# Patient Record
Sex: Male | Born: 1960 | Race: White | Hispanic: No | Marital: Single | Smoking: Never smoker
Health system: Southern US, Community
[De-identification: ages and names within clinical notes are randomized; demographics above are authoritative.]

## PROBLEM LIST (undated history)

## (undated) DIAGNOSIS — I10 Essential (primary) hypertension: Secondary | ICD-10-CM

## (undated) DIAGNOSIS — E1142 Type 2 diabetes mellitus with diabetic polyneuropathy: Secondary | ICD-10-CM

## (undated) DIAGNOSIS — I509 Heart failure, unspecified: Secondary | ICD-10-CM

## (undated) DIAGNOSIS — R1314 Dysphagia, pharyngoesophageal phase: Secondary | ICD-10-CM

## (undated) DIAGNOSIS — I429 Cardiomyopathy, unspecified: Secondary | ICD-10-CM

## (undated) DIAGNOSIS — I2510 Atherosclerotic heart disease of native coronary artery without angina pectoris: Secondary | ICD-10-CM

## (undated) DIAGNOSIS — K5792 Diverticulitis of intestine, part unspecified, without perforation or abscess without bleeding: Secondary | ICD-10-CM

## (undated) DIAGNOSIS — E11319 Type 2 diabetes mellitus with unspecified diabetic retinopathy without macular edema: Secondary | ICD-10-CM

## (undated) DIAGNOSIS — E7800 Pure hypercholesterolemia, unspecified: Secondary | ICD-10-CM

## (undated) DIAGNOSIS — Z8489 Family history of other specified conditions: Secondary | ICD-10-CM

## (undated) DIAGNOSIS — E119 Type 2 diabetes mellitus without complications: Secondary | ICD-10-CM

## (undated) DIAGNOSIS — R0600 Dyspnea, unspecified: Secondary | ICD-10-CM

## (undated) HISTORY — DX: Type 2 diabetes mellitus without complications: E11.9

## (undated) HISTORY — PX: WISDOM TOOTH EXTRACTION: SHX21

## (undated) HISTORY — DX: Diverticulitis of intestine, part unspecified, without perforation or abscess without bleeding: K57.92

## (undated) HISTORY — DX: Dysphagia, pharyngoesophageal phase: R13.14

## (undated) HISTORY — PX: CARDIAC CATHETERIZATION: SHX172

## (undated) HISTORY — DX: Essential (primary) hypertension: I10

## (undated) HISTORY — DX: Pure hypercholesterolemia, unspecified: E78.00

## (undated) HISTORY — PX: EYE SURGERY: SHX253

## (undated) HISTORY — DX: Type 2 diabetes mellitus with diabetic polyneuropathy: E11.42

---

## 2001-12-15 ENCOUNTER — Encounter: Admission: RE | Admit: 2001-12-15 | Discharge: 2002-03-15 | Payer: Self-pay | Admitting: Internal Medicine

## 2018-09-22 ENCOUNTER — Other Ambulatory Visit: Payer: Self-pay | Admitting: Family Medicine

## 2018-09-22 DIAGNOSIS — R1904 Left lower quadrant abdominal swelling, mass and lump: Secondary | ICD-10-CM

## 2018-09-22 DIAGNOSIS — R1032 Left lower quadrant pain: Secondary | ICD-10-CM

## 2018-10-03 ENCOUNTER — Ambulatory Visit
Admission: RE | Admit: 2018-10-03 | Discharge: 2018-10-03 | Disposition: A | Discharge status: Home / Self Care | Payer: 59 | Source: Ambulatory Visit | Attending: Family Medicine | Admitting: Family Medicine

## 2018-10-03 DIAGNOSIS — R1904 Left lower quadrant abdominal swelling, mass and lump: Secondary | ICD-10-CM

## 2018-10-03 DIAGNOSIS — R1032 Left lower quadrant pain: Secondary | ICD-10-CM

## 2018-10-03 MED ORDER — IOPAMIDOL (ISOVUE-300) INJECTION 61%
100.0000 mL | Freq: Once | INTRAVENOUS | Status: AC | PRN
  Administered 2018-10-03: 100 mL via INTRAVENOUS

## 2019-08-24 ENCOUNTER — Encounter: Payer: Self-pay | Admitting: Neurology

## 2019-08-28 ENCOUNTER — Other Ambulatory Visit: Payer: Self-pay | Admitting: Gastroenterology

## 2019-08-28 DIAGNOSIS — R599 Enlarged lymph nodes, unspecified: Secondary | ICD-10-CM

## 2019-08-28 DIAGNOSIS — R634 Abnormal weight loss: Secondary | ICD-10-CM

## 2019-08-28 DIAGNOSIS — R1032 Left lower quadrant pain: Secondary | ICD-10-CM

## 2019-09-07 ENCOUNTER — Ambulatory Visit
Admission: RE | Admit: 2019-09-07 | Discharge: 2019-09-07 | Disposition: A | Discharge status: Home / Self Care | Payer: 59 | Source: Ambulatory Visit | Attending: Gastroenterology | Admitting: Gastroenterology

## 2019-09-07 DIAGNOSIS — R634 Abnormal weight loss: Secondary | ICD-10-CM

## 2019-09-07 DIAGNOSIS — R1032 Left lower quadrant pain: Secondary | ICD-10-CM

## 2019-09-07 DIAGNOSIS — R599 Enlarged lymph nodes, unspecified: Secondary | ICD-10-CM

## 2019-09-07 MED ORDER — IOPAMIDOL (ISOVUE-300) INJECTION 61%
100.0000 mL | Freq: Once | INTRAVENOUS | Status: AC | PRN
  Administered 2019-09-07: 100 mL via INTRAVENOUS

## 2019-09-28 ENCOUNTER — Other Ambulatory Visit: Payer: Self-pay

## 2019-09-28 ENCOUNTER — Other Ambulatory Visit (INDEPENDENT_AMBULATORY_CARE_PROVIDER_SITE_OTHER): Payer: Managed Care, Other (non HMO)

## 2019-09-28 ENCOUNTER — Ambulatory Visit (INDEPENDENT_AMBULATORY_CARE_PROVIDER_SITE_OTHER): Payer: Managed Care, Other (non HMO) | Admitting: Neurology

## 2019-09-28 ENCOUNTER — Encounter: Payer: Self-pay | Admitting: Neurology

## 2019-09-28 VITALS — BP 131/82 | HR 78 | Ht 69.0 in | Wt 159.0 lb

## 2019-09-28 DIAGNOSIS — G621 Alcoholic polyneuropathy: Secondary | ICD-10-CM

## 2019-09-28 DIAGNOSIS — E639 Nutritional deficiency, unspecified: Secondary | ICD-10-CM | POA: Diagnosis not present

## 2019-09-28 DIAGNOSIS — E1142 Type 2 diabetes mellitus with diabetic polyneuropathy: Secondary | ICD-10-CM | POA: Diagnosis not present

## 2019-09-28 MED ORDER — GABAPENTIN 100 MG PO CAPS: ORAL_CAPSULE | ORAL | 3 refills | Status: DC

## 2019-09-28 NOTE — Progress Notes (Signed)
[facility] Clinic Note - Initial Visit   Date: 09/28/19  Brett Walker MRN: [id number] DOB: 1960/12/14   Dear Dr. Azucena CecilSwayne:  Thank you for your kind referral of Brett Walker for consultation of diabetic neuropathy. Although his history is well known to you, please allow us to reiterate it for the purpose of our medical record. The patient was accompanied to the clinic by self.    History of Present Illness: Brett Walker is a 58 y.o. right-handed male with diabetes mellitus, hyperlipidemia, hypertension, and alcohol abuse presenting for evaluation of neuropathy.   Starting around spring of 2020, he began having cold sensation of the toes, tingling, and numbness which started in the feet and has extended in to the lower legs up to the knees.  He denies weakness.  He has not had any fall, but reports imbalance.  He endorses mild weakness of the hands.  There is no numbness or tingling of the hands.  He also complains of achy pain involving the soles of the feet upon standing.  He takes gabapentin 300 mg at bedtime which helps with his nighttime pain, but he still has intermittent sporadic shooting pain in the legs throughout the day, worse in the evening.  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  He lives alone, no children.  He was previously drinking ~24 beers over the weekend for many years.    For the past year, he has been left lower quadrant abdominal pain and lost about 30 pounds unintentionally.  He is followed by gastroenterology and had a endoscopy which showed GI ulcers.  CT scan from November 2020 shows diffuse enlargement of the prostate and seminal vesicles, prostate neoplasm cannot be excluded.  However, there was an irregular opacity in the stomach body, possibly representing gastric mass.  Further testing was recommended with upper GI series. Normal CBC CMP and TSH.   Past Medical History:  Diagnosis Date  . Diabetes mellitus without  complication (HCC)   . Diabetic peripheral neuropathy (HCC)   . Diverticulitis   . Elevated LDL cholesterol level   . Hypertension   . Pharyngoesophageal dysphagia     Past Surgical History:  Procedure Laterality Date  . WISDOM TOOTH EXTRACTION       Medications:  Outpatient Encounter Medications as of 09/28/2019  Medication Sig  . bisacodyl (DULCOLAX) 5 MG EC tablet Take 5 mg by mouth daily as needed for moderate constipation.  . gabapentin (NEURONTIN) 300 MG capsule Take 300 mg by mouth once.  . metFORMIN (GLUCOPHAGE) 1000 MG tablet Take 1,000 mg by mouth daily.  . polyethylene glycol-electrolytes (NULYTELY/GOLYTELY) 420 g solution Take 4,000 mLs by mouth once.   No facility-administered encounter medications on file as of 09/28/2019.     Allergies:  Allergies  Allergen Reactions  . Penicillin G Hives  . Lipitor [Atorvastatin Calcium]     Family History: Family History  Problem Relation Age of Onset  . Parkinson's disease Father     Social History: Social History   Tobacco Use  . Smoking status: Never Smoker  . Smokeless tobacco: Never Used  Substance Use Topics  . Alcohol use: Never    Frequency: Never  . Drug use: Never   Social History   Social History Narrative   Works at [employer]   Single   Right handed   First floor apartment    Review of Systems:  CONSTITUTIONAL: No fevers, chills, night sweats, + 15 lb weight loss.  EYES: No visual changes or eye pain ENT: No hearing changes.  No history of nose bleeds.   RESPIRATORY: No cough, wheezing and shortness of breath.   CARDIOVASCULAR: Negative for chest pain, and palpitations.   GI: +for abdominal discomfort, blood in stools or black stools.  No recent change in bowel habits.   GU:  No history of incontinence.   MUSCLOSKELETAL: No history of joint pain or swelling.  No myalgias.   SKIN: Negative for lesions, rash, and itching.   HEMATOLOGY/ONCOLOGY: Negative for prolonged bleeding,  bruising easily, and swollen nodes.  No history of cancer.   ENDOCRINE: Negative for cold or heat intolerance, polydipsia or goiter.   PSYCH:  No depression or anxiety symptoms.   NEURO: As Above.   Vital Signs:  BP 131/82   Pulse 78   Ht 5\' 9"  (1.753 m)   Wt 159 lb (72.1 kg)   SpO2 100%   BMI 23.48 kg/m    General Medical Exam:   General:  Thin appearing, comfortable.   Eyes/ENT: see cranial nerve examination.   Neck:   No carotid bruits. Respiratory:  Clear to auscultation, good air entry bilaterally.   Cardiac:  Regular rate and rhythm, no murmur.   Extremities:  No deformities, edema, or skin discoloration.  Skin:  No rashes or lesions.  Neurological Exam: MENTAL STATUS including orientation to time, place, person, recent and remote memory, attention span and concentration, language, and fund of knowledge is normal.  Speech is not dysarthric.  CRANIAL NERVES: II:  No visual field defects.   III-IV-VI: Pupils equal round and reactive to light.  Normal conjugate, extra-ocular eye movements in all directions of gaze.  No nystagmus.  No ptosis.   V:  Normal facial sensation.    VII:  Normal facial symmetry and movements.   VIII:  Normal hearing and vestibular function.   IX-X:  Normal palatal movement.   XI:  Normal shoulder shrug and head rotation.   XII:  Normal tongue strength and range of motion, no deviation or fasciculation.  MOTOR: Generalized loss of muscle bulk in the hands and legs.  There is mild intrinsic and muscle atrophy.  No fasciculations or abnormal movements.  No pronator drift.   Upper Extremity:  Right  Left  Deltoid  5/5   5/5   Biceps  5/5   5/5   Triceps  5/5   5/5   Infraspinatus 5/5  5/5  Medial pectoralis 5/5  5/5  Wrist extensors  5/5   5/5   Wrist flexors  5/5   5/5   Finger extensors  5/5   5/5   Finger flexors  5/5   5/5   Dorsal interossei  4/5   4/5   Abductor pollicis  5/5   5/5   Tone (Ashworth scale)  0  0   Lower Extremity:   Right  Left  Hip flexors  5/5   5/5   Hip extensors  5/5   5/5   Adductor 5/5  5/5  Abductor 5/5  5/5  Knee flexors  5/5   5/5   Knee extensors  5/5   5/5   Dorsiflexors  5/5   5/5   Plantarflexors  5/5   5/5   Toe extensors  5/5   5/5   Toe flexors  5/5   5/5   Tone (Ashworth scale)  0  0   MSRs:  Right        Left  brachioradialis 2+  2+  biceps 2+  2+  triceps 2+  2+  patellar 0  0  ankle jerk 0  0  Hoffman no  no  plantar response down  down   SENSORY: Sensation to all modalities is intact and not hands.  Vibration is reduced to 50% at the ankles and ~20% at the great toe.  There is a gradient pattern of temperature and pinprick loss below the knee and worse distally. Romberg's sign present.   COORDINATION/GAIT: Normal finger-to- nose-finger.  Intact rapid alternating movements bilaterally.  Able to rise from a chair without using arms.  Gait narrow based and stable.  Stressed gait is intact.  He is very unsteady with tandem gait.     IMPRESSION: Subacute polyneuropathy manifesting with sensory deficits in the legs and motor weakness in the hands.  Symptoms started in the spring 2020 and have been progressive.  I am concerned that his neuropathy has been getting worse and relatively rapid manner, which is atypical for diabetes.  Nutritional deficiency and alcohol-induced neuropathies can be a in more of an acute manner as such.  Alternatively, with his weight loss, paraneoplastic neuropathy also needs to be considered.    PLAN/RECOMMENDATIONS:  Check vitamin B12, folate, vitamin B1, copper, SPEP with IFE NCS/EMG left arm and leg Start gabapentin 100mg  at 6pm, continue 300mg  at bedtime.  Further titrate as tolerable Patient education on importance of abstaining from alcohol abstinence  Patient educated on daily foot inspection, fall prevention, and safety precautions around the home.  Further recommendations pending results.  Thank you for allowing me to  participate in patient's care.  If I can answer any additional questions, I would be pleased to do so.    Sincerely,    Kostas Marrow K. Posey Pronto, DO

## 2019-09-28 NOTE — Patient Instructions (Addendum)
Nerve testing of the left arm and leg  Check labs  Your provider has requested that you have labwork completed today. Please go to [facility] ([id number]) on the second floor of this building before leaving the office today. You do not need to check in. If you are not called within 15 minutes please check with the front desk. Start gabapentin 100mg  at 6pm.  Continue gabapentin 300mg  at bedtime  Check your feet daily  Take extra caution on uneven ground  ELECTROMYOGRAM AND NERVE CONDUCTION STUDIES (EMG/NCS) INSTRUCTIONS  How to Prepare The neurologist conducting the EMG will need to know if you have certain medical conditions. Tell the neurologist and other EMG lab personnel if you: . Have a pacemaker or any other electrical medical device . Take blood-thinning medications . Have hemophilia, a blood-clotting disorder that causes prolonged bleeding Bathing Take a shower or bath shortly before your exam in order to remove oils from your skin. Don't apply lotions or creams before the exam.  What to Expect You'll likely be asked to change into a hospital gown for the procedure and lie down on an examination table. The following explanations can help you understand what will happen during the exam.  . Electrodes. The neurologist or a technician places surface electrodes at various locations on your skin depending on where you're experiencing symptoms. Or the neurologist may insert needle electrodes at different sites depending on your symptoms.  . Sensations. The electrodes will at times transmit a tiny electrical current that you may feel as a twinge or spasm. The needle electrode may cause discomfort or pain that usually ends shortly after the needle is removed. If you are concerned about discomfort or pain, you may want to talk to the neurologist about taking a short break during the exam.  . Instructions. During the needle EMG, the neurologist will assess whether there is any  spontaneous electrical activity when the muscle is at rest - activity that isn't present in healthy muscle tissue - and the degree of activity when you slightly contract the muscle.  He or she will give you instructions on resting and contracting a muscle at appropriate times. Depending on what muscles and nerves the neurologist is examining, he or she may ask you to change positions during the exam.  After your EMG You may experience some temporary, minor bruising where the needle electrode was inserted into your muscle. This bruising should fade within several days. If it persists, contact your primary care doctor.

## 2019-10-02 LAB — VITAMIN B1: Vitamin B1 (Thiamine): 9 nmol/L (ref 8–30)

## 2019-10-02 LAB — PROTEIN ELECTROPHORESIS, SERUM
Albumin ELP: 3.8 g/dL (ref 3.8–4.8)
Alpha 1: 0.3 g/dL (ref 0.2–0.3)
Alpha 2: 0.6 g/dL (ref 0.5–0.9)
Beta 2: 0.3 g/dL (ref 0.2–0.5)
Beta Globulin: 0.3 g/dL — ABNORMAL LOW (ref 0.4–0.6)
Gamma Globulin: 0.7 g/dL — ABNORMAL LOW (ref 0.8–1.7)
Total Protein: 6 g/dL — ABNORMAL LOW (ref 6.1–8.1)

## 2019-10-02 LAB — IMMUNOFIXATION ELECTROPHORESIS
IgG (Immunoglobin G), Serum: 726 mg/dL (ref 600–1640)
IgM, Serum: 26 mg/dL — ABNORMAL LOW (ref 50–300)
Immunofix Electr Int: NOT DETECTED
Immunoglobulin A: 248 mg/dL (ref 47–310)

## 2019-10-02 LAB — B12 AND FOLATE PANEL
Folate: 6.6 ng/mL
Vitamin B-12: 276 pg/mL (ref 200–1100)

## 2019-10-02 LAB — COPPER, SERUM: Copper: 88 ug/dL (ref 70–175)

## 2021-04-11 DIAGNOSIS — H25813 Combined forms of age-related cataract, bilateral: Secondary | ICD-10-CM | POA: Diagnosis not present

## 2021-04-11 DIAGNOSIS — H53132 Sudden visual loss, left eye: Secondary | ICD-10-CM | POA: Diagnosis not present

## 2021-04-11 DIAGNOSIS — H4312 Vitreous hemorrhage, left eye: Secondary | ICD-10-CM | POA: Diagnosis not present

## 2021-04-11 DIAGNOSIS — E113511 Type 2 diabetes mellitus with proliferative diabetic retinopathy with macular edema, right eye: Secondary | ICD-10-CM | POA: Diagnosis not present

## 2021-04-14 DIAGNOSIS — E113212 Type 2 diabetes mellitus with mild nonproliferative diabetic retinopathy with macular edema, left eye: Secondary | ICD-10-CM | POA: Diagnosis not present

## 2021-05-05 DIAGNOSIS — E113511 Type 2 diabetes mellitus with proliferative diabetic retinopathy with macular edema, right eye: Secondary | ICD-10-CM | POA: Diagnosis not present

## 2021-05-18 DIAGNOSIS — E113212 Type 2 diabetes mellitus with mild nonproliferative diabetic retinopathy with macular edema, left eye: Secondary | ICD-10-CM | POA: Diagnosis not present

## 2021-05-22 DIAGNOSIS — E1169 Type 2 diabetes mellitus with other specified complication: Secondary | ICD-10-CM | POA: Diagnosis not present

## 2021-05-22 DIAGNOSIS — E1142 Type 2 diabetes mellitus with diabetic polyneuropathy: Secondary | ICD-10-CM | POA: Diagnosis not present

## 2021-05-22 DIAGNOSIS — I10 Essential (primary) hypertension: Secondary | ICD-10-CM | POA: Diagnosis not present

## 2021-05-22 DIAGNOSIS — E7800 Pure hypercholesterolemia, unspecified: Secondary | ICD-10-CM | POA: Diagnosis not present

## 2021-05-24 DIAGNOSIS — E119 Type 2 diabetes mellitus without complications: Secondary | ICD-10-CM | POA: Diagnosis not present

## 2021-05-24 DIAGNOSIS — F5104 Psychophysiologic insomnia: Secondary | ICD-10-CM | POA: Diagnosis not present

## 2021-05-24 DIAGNOSIS — E559 Vitamin D deficiency, unspecified: Secondary | ICD-10-CM | POA: Diagnosis not present

## 2021-06-06 DIAGNOSIS — E113511 Type 2 diabetes mellitus with proliferative diabetic retinopathy with macular edema, right eye: Secondary | ICD-10-CM | POA: Diagnosis not present

## 2021-06-22 DIAGNOSIS — E113212 Type 2 diabetes mellitus with mild nonproliferative diabetic retinopathy with macular edema, left eye: Secondary | ICD-10-CM | POA: Diagnosis not present

## 2021-07-05 DIAGNOSIS — R809 Proteinuria, unspecified: Secondary | ICD-10-CM | POA: Diagnosis not present

## 2021-07-05 DIAGNOSIS — E119 Type 2 diabetes mellitus without complications: Secondary | ICD-10-CM | POA: Diagnosis not present

## 2021-07-05 DIAGNOSIS — E559 Vitamin D deficiency, unspecified: Secondary | ICD-10-CM | POA: Diagnosis not present

## 2021-07-05 DIAGNOSIS — E1140 Type 2 diabetes mellitus with diabetic neuropathy, unspecified: Secondary | ICD-10-CM | POA: Diagnosis not present

## 2021-07-12 DIAGNOSIS — E113511 Type 2 diabetes mellitus with proliferative diabetic retinopathy with macular edema, right eye: Secondary | ICD-10-CM | POA: Diagnosis not present

## 2021-07-31 DIAGNOSIS — H35371 Puckering of macula, right eye: Secondary | ICD-10-CM | POA: Diagnosis not present

## 2021-07-31 DIAGNOSIS — E113511 Type 2 diabetes mellitus with proliferative diabetic retinopathy with macular edema, right eye: Secondary | ICD-10-CM | POA: Diagnosis not present

## 2021-07-31 DIAGNOSIS — H3582 Retinal ischemia: Secondary | ICD-10-CM | POA: Diagnosis not present

## 2021-07-31 DIAGNOSIS — E113312 Type 2 diabetes mellitus with moderate nonproliferative diabetic retinopathy with macular edema, left eye: Secondary | ICD-10-CM | POA: Diagnosis not present

## 2021-08-07 DIAGNOSIS — E113511 Type 2 diabetes mellitus with proliferative diabetic retinopathy with macular edema, right eye: Secondary | ICD-10-CM | POA: Diagnosis not present

## 2021-08-17 DIAGNOSIS — E113511 Type 2 diabetes mellitus with proliferative diabetic retinopathy with macular edema, right eye: Secondary | ICD-10-CM | POA: Diagnosis not present

## 2021-08-23 DIAGNOSIS — E113512 Type 2 diabetes mellitus with proliferative diabetic retinopathy with macular edema, left eye: Secondary | ICD-10-CM | POA: Diagnosis not present

## 2021-08-25 DIAGNOSIS — H53132 Sudden visual loss, left eye: Secondary | ICD-10-CM | POA: Diagnosis not present

## 2021-08-25 DIAGNOSIS — E113511 Type 2 diabetes mellitus with proliferative diabetic retinopathy with macular edema, right eye: Secondary | ICD-10-CM | POA: Diagnosis not present

## 2021-08-25 DIAGNOSIS — H35371 Puckering of macula, right eye: Secondary | ICD-10-CM | POA: Diagnosis not present

## 2021-08-25 DIAGNOSIS — H4312 Vitreous hemorrhage, left eye: Secondary | ICD-10-CM | POA: Diagnosis not present

## 2021-08-25 DIAGNOSIS — E113212 Type 2 diabetes mellitus with mild nonproliferative diabetic retinopathy with macular edema, left eye: Secondary | ICD-10-CM | POA: Diagnosis not present

## 2021-09-22 DIAGNOSIS — E113511 Type 2 diabetes mellitus with proliferative diabetic retinopathy with macular edema, right eye: Secondary | ICD-10-CM | POA: Diagnosis not present

## 2021-09-26 ENCOUNTER — Emergency Department (HOSPITAL_BASED_OUTPATIENT_CLINIC_OR_DEPARTMENT_OTHER): Payer: BC Managed Care – PPO

## 2021-09-26 ENCOUNTER — Inpatient Hospital Stay (HOSPITAL_BASED_OUTPATIENT_CLINIC_OR_DEPARTMENT_OTHER)
Admission: EM | Admit: 2021-09-26 | Discharge: 2021-09-30 | DRG: 286 | Disposition: A | Discharge status: Home / Self Care | Payer: BC Managed Care – PPO | Attending: Family Medicine | Admitting: Family Medicine

## 2021-09-26 ENCOUNTER — Encounter (HOSPITAL_BASED_OUTPATIENT_CLINIC_OR_DEPARTMENT_OTHER): Payer: Self-pay

## 2021-09-26 ENCOUNTER — Other Ambulatory Visit: Payer: Self-pay

## 2021-09-26 DIAGNOSIS — Z8249 Family history of ischemic heart disease and other diseases of the circulatory system: Secondary | ICD-10-CM | POA: Diagnosis not present

## 2021-09-26 DIAGNOSIS — Z20822 Contact with and (suspected) exposure to covid-19: Secondary | ICD-10-CM | POA: Diagnosis not present

## 2021-09-26 DIAGNOSIS — E1142 Type 2 diabetes mellitus with diabetic polyneuropathy: Secondary | ICD-10-CM | POA: Diagnosis not present

## 2021-09-26 DIAGNOSIS — Z7984 Long term (current) use of oral hypoglycemic drugs: Secondary | ICD-10-CM

## 2021-09-26 DIAGNOSIS — E11319 Type 2 diabetes mellitus with unspecified diabetic retinopathy without macular edema: Secondary | ICD-10-CM | POA: Diagnosis present

## 2021-09-26 DIAGNOSIS — R7989 Other specified abnormal findings of blood chemistry: Secondary | ICD-10-CM

## 2021-09-26 DIAGNOSIS — E1169 Type 2 diabetes mellitus with other specified complication: Secondary | ICD-10-CM | POA: Diagnosis not present

## 2021-09-26 DIAGNOSIS — I517 Cardiomegaly: Secondary | ICD-10-CM | POA: Diagnosis not present

## 2021-09-26 DIAGNOSIS — I5043 Acute on chronic combined systolic (congestive) and diastolic (congestive) heart failure: Secondary | ICD-10-CM | POA: Diagnosis not present

## 2021-09-26 DIAGNOSIS — I5041 Acute combined systolic (congestive) and diastolic (congestive) heart failure: Secondary | ICD-10-CM | POA: Diagnosis not present

## 2021-09-26 DIAGNOSIS — Z6832 Body mass index (BMI) 32.0-32.9, adult: Secondary | ICD-10-CM

## 2021-09-26 DIAGNOSIS — G4733 Obstructive sleep apnea (adult) (pediatric): Secondary | ICD-10-CM | POA: Diagnosis present

## 2021-09-26 DIAGNOSIS — R0602 Shortness of breath: Secondary | ICD-10-CM | POA: Diagnosis not present

## 2021-09-26 DIAGNOSIS — E669 Obesity, unspecified: Secondary | ICD-10-CM | POA: Diagnosis not present

## 2021-09-26 DIAGNOSIS — I10 Essential (primary) hypertension: Secondary | ICD-10-CM | POA: Diagnosis not present

## 2021-09-26 DIAGNOSIS — N179 Acute kidney failure, unspecified: Secondary | ICD-10-CM | POA: Diagnosis not present

## 2021-09-26 DIAGNOSIS — R778 Other specified abnormalities of plasma proteins: Secondary | ICD-10-CM | POA: Diagnosis not present

## 2021-09-26 DIAGNOSIS — I2510 Atherosclerotic heart disease of native coronary artery without angina pectoris: Secondary | ICD-10-CM | POA: Diagnosis present

## 2021-09-26 DIAGNOSIS — I509 Heart failure, unspecified: Secondary | ICD-10-CM | POA: Diagnosis not present

## 2021-09-26 DIAGNOSIS — I255 Ischemic cardiomyopathy: Secondary | ICD-10-CM | POA: Diagnosis present

## 2021-09-26 DIAGNOSIS — Z888 Allergy status to other drugs, medicaments and biological substances status: Secondary | ICD-10-CM | POA: Diagnosis not present

## 2021-09-26 DIAGNOSIS — I248 Other forms of acute ischemic heart disease: Secondary | ICD-10-CM | POA: Diagnosis present

## 2021-09-26 DIAGNOSIS — E785 Hyperlipidemia, unspecified: Secondary | ICD-10-CM | POA: Diagnosis present

## 2021-09-26 DIAGNOSIS — E119 Type 2 diabetes mellitus without complications: Secondary | ICD-10-CM | POA: Diagnosis present

## 2021-09-26 DIAGNOSIS — Z79899 Other long term (current) drug therapy: Secondary | ICD-10-CM

## 2021-09-26 DIAGNOSIS — I5021 Acute systolic (congestive) heart failure: Secondary | ICD-10-CM | POA: Diagnosis not present

## 2021-09-26 DIAGNOSIS — Z66 Do not resuscitate: Secondary | ICD-10-CM | POA: Diagnosis present

## 2021-09-26 DIAGNOSIS — R079 Chest pain, unspecified: Secondary | ICD-10-CM | POA: Diagnosis not present

## 2021-09-26 DIAGNOSIS — J90 Pleural effusion, not elsewhere classified: Secondary | ICD-10-CM | POA: Diagnosis not present

## 2021-09-26 DIAGNOSIS — I110 Hypertensive heart disease with heart failure: Secondary | ICD-10-CM | POA: Diagnosis not present

## 2021-09-26 DIAGNOSIS — N400 Enlarged prostate without lower urinary tract symptoms: Secondary | ICD-10-CM | POA: Diagnosis not present

## 2021-09-26 DIAGNOSIS — J9811 Atelectasis: Secondary | ICD-10-CM | POA: Diagnosis not present

## 2021-09-26 DIAGNOSIS — E6609 Other obesity due to excess calories: Secondary | ICD-10-CM | POA: Diagnosis not present

## 2021-09-26 DIAGNOSIS — M7989 Other specified soft tissue disorders: Secondary | ICD-10-CM | POA: Diagnosis not present

## 2021-09-26 HISTORY — DX: Type 2 diabetes mellitus with unspecified diabetic retinopathy without macular edema: E11.319

## 2021-09-26 LAB — CBC WITH DIFFERENTIAL/PLATELET
Abs Immature Granulocytes: 0.02 10*3/uL (ref 0.00–0.07)
Basophils Absolute: 0.1 10*3/uL (ref 0.0–0.1)
Basophils Relative: 1 %
Eosinophils Absolute: 0.2 10*3/uL (ref 0.0–0.5)
Eosinophils Relative: 4 %
HCT: 36.7 % — ABNORMAL LOW (ref 39.0–52.0)
Hemoglobin: 12.6 g/dL — ABNORMAL LOW (ref 13.0–17.0)
Immature Granulocytes: 0 %
Lymphocytes Relative: 19 %
Lymphs Abs: 1.1 10*3/uL (ref 0.7–4.0)
MCH: 30.7 pg (ref 26.0–34.0)
MCHC: 34.3 g/dL (ref 30.0–36.0)
MCV: 89.3 fL (ref 80.0–100.0)
Monocytes Absolute: 0.6 10*3/uL (ref 0.1–1.0)
Monocytes Relative: 10 %
Neutro Abs: 3.8 10*3/uL (ref 1.7–7.7)
Neutrophils Relative %: 66 %
Platelets: 214 10*3/uL (ref 150–400)
RBC: 4.11 MIL/uL — ABNORMAL LOW (ref 4.22–5.81)
RDW: 13.7 % (ref 11.5–15.5)
WBC: 5.8 10*3/uL (ref 4.0–10.5)
nRBC: 0 % (ref 0.0–0.2)

## 2021-09-26 LAB — COMPREHENSIVE METABOLIC PANEL
ALT: 21 U/L (ref 0–44)
AST: 16 U/L (ref 15–41)
Albumin: 3.3 g/dL — ABNORMAL LOW (ref 3.5–5.0)
Alkaline Phosphatase: 56 U/L (ref 38–126)
Anion gap: 6 (ref 5–15)
BUN: 23 mg/dL — ABNORMAL HIGH (ref 6–20)
CO2: 21 mmol/L — ABNORMAL LOW (ref 22–32)
Calcium: 8.5 mg/dL — ABNORMAL LOW (ref 8.9–10.3)
Chloride: 111 mmol/L (ref 98–111)
Creatinine, Ser: 1.15 mg/dL (ref 0.61–1.24)
GFR, Estimated: >60 mL/min (ref >60)
Glucose, Bld: 148 mg/dL — ABNORMAL HIGH (ref 70–99)
Potassium: 4.7 mmol/L (ref 3.5–5.1)
Sodium: 138 mmol/L (ref 135–145)
Total Bilirubin: 0.5 mg/dL (ref 0.3–1.2)
Total Protein: 6 g/dL — ABNORMAL LOW (ref 6.5–8.1)

## 2021-09-26 LAB — BRAIN NATRIURETIC PEPTIDE: B Natriuretic Peptide: 1194.7 pg/mL — ABNORMAL HIGH (ref 0.0–100.0)

## 2021-09-26 LAB — TROPONIN I (HIGH SENSITIVITY): Troponin I (High Sensitivity): 55 ng/L — ABNORMAL HIGH (ref <18)

## 2021-09-26 MED ORDER — IOHEXOL 350 MG/ML SOLN
75.0000 mL | Freq: Once | INTRAVENOUS | Status: AC | PRN
  Administered 2021-09-26: 75 mL via INTRAVENOUS

## 2021-09-26 MED ORDER — FUROSEMIDE 10 MG/ML IJ SOLN
40.0000 mg | Freq: Once | INTRAMUSCULAR | Status: AC
  Administered 2021-09-26: 40 mg via INTRAVENOUS
  Filled 2021-09-26: qty 4

## 2021-09-26 NOTE — ED Provider Notes (Signed)
Nursing notes and vitals signs, including pulse oximetry, reviewed.  Summary of this visit's results, reviewed by myself:  EKG:  EKG Interpretation  Date/Time:  Tuesday September 26 2021 20:34:32 EST Ventricular Rate:  103 PR Interval:  148 QRS Duration: 99 QT Interval:  346 QTC Calculation: 453 R Axis:   69 Text Interpretation: Sinus tachycardia Consider anterior infarct Nonspecific repol abnormality, lateral leads Baseline wander in lead(s) V5 Sinus tachycardia Confirmed by Coralee Pesa [phone]) on 09/26/2021 10:07:38 PM        Labs:  Results for orders placed or performed during the hospital encounter of 09/26/21 (from the past 24 hour(s))  CBC with Differential     Status: Abnormal   Collection Time: 09/26/21 10:16 PM  Result Value Ref Range   WBC 5.8 4.0 - 10.5 K/uL   RBC 4.11 (L) 4.22 - 5.81 MIL/uL   Hemoglobin 12.6 (L) 13.0 - 17.0 g/dL   HCT 10.2 (L) 72.5 - 36.6 %   MCV 89.3 80.0 - 100.0 fL   MCH 30.7 26.0 - 34.0 pg   MCHC 34.3 30.0 - 36.0 g/dL   RDW 44.0 34.7 - 42.5 %   Platelets 214 150 - 400 K/uL   nRBC 0.0 0.0 - 0.2 %   Neutrophils Relative % 66 %   Neutro Abs 3.8 1.7 - 7.7 K/uL   Lymphocytes Relative 19 %   Lymphs Abs 1.1 0.7 - 4.0 K/uL   Monocytes Relative 10 %   Monocytes Absolute 0.6 0.1 - 1.0 K/uL   Eosinophils Relative 4 %   Eosinophils Absolute 0.2 0.0 - 0.5 K/uL   Basophils Relative 1 %   Basophils Absolute 0.1 0.0 - 0.1 K/uL   Immature Granulocytes 0 %   Abs Immature Granulocytes 0.02 0.00 - 0.07 K/uL  Comprehensive metabolic panel     Status: Abnormal   Collection Time: 09/26/21 10:16 PM  Result Value Ref Range   Sodium 138 135 - 145 mmol/L   Potassium 4.7 3.5 - 5.1 mmol/L   Chloride 111 98 - 111 mmol/L   CO2 21 (L) 22 - 32 mmol/L   Glucose, Bld 148 (H) 70 - 99 mg/dL   BUN 23 (H) 6 - 20 mg/dL   Creatinine, Ser 9.56 0.61 - 1.24 mg/dL   Calcium 8.5 (L) 8.9 - 10.3 mg/dL   Total Protein 6.0 (L) 6.5 - 8.1 g/dL   Albumin 3.3 (L) 3.5 - 5.0 g/dL    AST 16 15 - 41 U/L   ALT 21 0 - 44 U/L   Alkaline Phosphatase 56 38 - 126 U/L   Total Bilirubin 0.5 0.3 - 1.2 mg/dL   GFR, Estimated >38 >75 mL/min   Anion gap 6 5 - 15  Troponin I (High Sensitivity)     Status: Abnormal   Collection Time: 09/26/21 10:16 PM  Result Value Ref Range   Troponin I (High Sensitivity) 55 (H) <18 ng/L  Brain natriuretic peptide     Status: Abnormal   Collection Time: 09/26/21 10:16 PM  Result Value Ref Range   B Natriuretic Peptide 1,194.7 (H) 0.0 - 100.0 pg/mL    Imaging Studies: DG Chest 2 View  Result Date: 09/26/2021 CLINICAL DATA:  Shortness of breath. EXAM: CHEST - 2 VIEW COMPARISON:  None. FINDINGS: Small bilateral pleural effusions and bibasilar atelectasis or infiltrate. There is mild cardiomegaly with mild vascular congestion. No pneumothorax. No acute osseous pathology. Degenerative changes of spine. IMPRESSION: 1. Small bilateral pleural effusions and bibasilar atelectasis or infiltrate. 2.  Mild cardiomegaly with mild vascular congestion. Electronically Signed   By: Elgie Collard M.D.   On: 09/26/2021 22:24   CT Angio Chest PE W/Cm &/Or Wo Cm  Result Date: 09/26/2021 CLINICAL DATA:  Lower extremity pain and swelling for 2 weeks with shortness of breath, initial encounter EXAM: CT ANGIOGRAPHY CHEST WITH CONTRAST TECHNIQUE: Multidetector CT imaging of the chest was performed using the standard protocol during bolus administration of intravenous contrast. Multiplanar CT image reconstructions and MIPs were obtained to evaluate the vascular anatomy. CONTRAST:  44mL OMNIPAQUE IOHEXOL 350 MG/ML SOLN COMPARISON:  Chest x-ray from earlier in the same day. FINDINGS: Cardiovascular: Thoracic aorta shows a normal enhancement pattern. No aneurysmal dilatation or dissection is noted. Coronary calcifications are seen. No cardiac enlargement is noted. Pulmonary artery shows a normal branching pattern without intraluminal filling defect to suggest pulmonary  embolism. Mediastinum/Nodes: Thoracic inlet is within normal limits. The esophagus as visualized is within normal limits. No sizable hilar or mediastinal adenopathy is noted. Lungs/Pleura: Large bilateral pleural effusions are noted. Lower lobe atelectatic changes are noted in a compensatory fashion. No focal confluent infiltrate is seen. No sizable parenchymal nodule is noted. Upper Abdomen: No acute abnormality. Musculoskeletal: Degenerative changes of the thoracic spine are noted. No acute rib abnormality is noted. Review of the MIP images confirms the above findings. IMPRESSION: No evidence pulmonary embolism. Large bilateral pleural effusions with compensatory lower lobe atelectatic changes. Electronically Signed   By: Alcide Clever M.D.   On: 09/26/2021 23:23   US Venous Img Lower Bilateral  Result Date: 09/26/2021 CLINICAL DATA:  Shortness of breath. EXAM: BILATERAL LOWER EXTREMITY VENOUS DOPPLER ULTRASOUND TECHNIQUE: Gray-scale sonography with compression, as well as color and duplex ultrasound, were performed to evaluate the deep venous system(s) from the level of the common femoral vein through the popliteal and proximal calf veins. COMPARISON:  None. FINDINGS: VENOUS Normal compressibility of the common femoral, superficial femoral, and popliteal veins, as well as the visualized calf veins. Visualized portions of profunda femoral vein and great saphenous vein unremarkable. No filling defects to suggest DVT on grayscale or color Doppler imaging. Doppler waveforms show normal direction of venous flow, normal respiratory plasticity and response to augmentation. OTHER None. Limitations: none IMPRESSION: Negative. Electronically Signed   By: Darliss Cheney M.D.   On: 09/26/2021 23:14    11:49 PM Dr. Rachael Darby accepts for admission to hospitalist service.  He requests we administer a dose of Lasix.   Tyberius Ryner, MD 09/26/21 2350

## 2021-09-26 NOTE — ED Provider Notes (Signed)
[place] EMERGENCY DEPARTMENT Provider Note   CSN: [id number] Arrival date & time: 09/26/21  2018     History Chief Complaint  Patient presents with   Abnormal Lab   Chest Pain    Brett Walker is a 60 y.o. male.  HPI  60 year old male with past medical history of DM, HTN, HLD presents the emergency department at the request of the primary doctor for concern of chest pain, shortness of breath and bilateral lower extremity swelling.  Patient states the symptoms have been going on for the past week.  Worse when he lays flat at night.  No cardiac history or history of CHF.  He had outpatient blood work done today which reportedly showed an elevated D-dimer.  He was sent here to rule out PE.  Patient has no history of DVT/PE, no current risk factors.  While laying in bed has no active chest pain or shortness of breath but is complaining of right worse than left lower extremity swelling.  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 Authorizing Provider  bisacodyl (DULCOLAX) 5 MG EC tablet Take 5 mg by mouth daily as needed for  moderate constipation.    [provider]  gabapentin (NEURONTIN) 100 MG capsule Take 1 tablet at 6pm. 09/28/19   Patel, Arvin Collard K, DO  gabapentin (NEURONTIN) 300 MG capsule Take 300 mg by mouth once.    [provider]  metFORMIN (GLUCOPHAGE) 1000 MG tablet Take 1,000 mg by mouth daily. 09/23/19   [provider]  polyethylene glycol-electrolytes (NULYTELY/GOLYTELY) 420 g solution Take 4,000 mLs by mouth once.    [provider]    Allergies    Penicillin g, Lipitor [atorvastatin calcium], and Ozempic (0.25 or 0.5 mg-dose) [semaglutide(0.25 or 0.5mg -dos)]  Review of Systems   Review of Systems  Constitutional:  Positive for fatigue. Negative for chills and fever.  HENT:  Negative for congestion.   Eyes:  Negative for visual disturbance.  Respiratory:  Positive for shortness of breath.   Cardiovascular:  Positive for chest pain and leg swelling. Negative for palpitations.  Gastrointestinal:  Negative for abdominal pain, diarrhea and vomiting.  Genitourinary:  Negative for dysuria.  Skin:  Negative for rash.  Neurological:  Negative for headaches.   Physical Exam Updated Vital Signs BP 136/82   Pulse 93   Temp 98.4 F (36.9 C) (Oral)   Resp (!) 22   Ht 5\' 9"  (1.753 m)   Wt 105.2 kg   SpO2 94%   BMI 34.26  kg/m   Physical Exam Vitals and nursing note reviewed.  Constitutional:      General: He is not in acute distress.    Appearance: Normal appearance.  HENT:     Head: Normocephalic.     Mouth/Throat:     Mouth: Mucous membranes are moist.  Cardiovascular:     Rate and Rhythm: Tachycardia present.  Pulmonary:     Effort: Pulmonary effort is normal. Tachypnea present. No respiratory distress.     Breath sounds: No decreased breath sounds.     Comments: Scattered rales at bases Abdominal:     Palpations: Abdomen is soft.     Tenderness: There is no abdominal tenderness.  Musculoskeletal:     Right lower leg: Edema present.     Left  lower leg: Edema present.  Skin:    General: Skin is warm.     Coloration: Skin is not cyanotic or pale.  Neurological:     Mental Status: He is alert and oriented to person, place, and time. Mental status is at baseline.  Psychiatric:        Mood and Affect: Mood normal.    ED Results / Procedures / Treatments   Labs (all labs ordered are listed, but only abnormal results are displayed) Labs Reviewed  CBC WITH DIFFERENTIAL/PLATELET  COMPREHENSIVE METABOLIC PANEL  BRAIN NATRIURETIC PEPTIDE  TROPONIN I (HIGH SENSITIVITY)    EKG EKG Interpretation  Date/Time:  Tuesday September 26 2021 20:34:32 EST Ventricular Rate:  103 PR Interval:  148 QRS Duration: 99 QT Interval:  346 QTC Calculation: 453 R Axis:   69 Text Interpretation: Sinus tachycardia Consider anterior infarct Nonspecific repol abnormality, lateral leads Baseline wander in lead(s) V5 Sinus tachycardia Confirmed by Lavenia Atlas [phone]) on 09/26/2021 10:07:38 PM  Radiology No results found.  Procedures Procedures   Medications Ordered in ED Medications - No data to display  ED Course  I have reviewed the triage vital signs and the nursing notes.  Pertinent labs & imaging results that were available during my care of the patient were reviewed by me and considered in my medical decision making (see chart for details).    MDM Rules/Calculators/A&P                           60 year old male presents emergency department with request of the primary doctor for rule out PE.  He has been having symptoms of chest pain, shortness of breath and bilateral lower extremity swelling.  Reportedly had an elevated D-dimer as an outpatient.  I am unable to see this lab results myself but he has a documented as 0.67 elevated D-dimer on outpatient paperwork.  Patient is tachycardic on arrival, EKG shows sinus tachycardia.  He has scattered rales at the lung bases, no respiratory distress.  He does have bilateral lower extremity  edema, worse on the right, legs otherwise neurovascularly intact, no signs of cellulitis.  Chest x-ray shows bilateral pleural effusions, cardiomegaly with some mild vascular congestion.  Concerning for CHF picture.  Blood work shows elevated troponin and BNP.  We will plan for CT PE study to rule out PE/saddle embolus.  If this is negative patient will still require admission from a CHF standpoint.  Ultrasound rule out DVT.  Patient is pending CT PE study.  Patient care transferred to night physician Dr. Florina Ou pending completion of evaluation.  Final Clinical Impression(s) / ED Diagnoses Final diagnoses:  None    Rx / DC  Orders ED Discharge Orders     None        Rozelle Logan, DO 09/26/21 2304

## 2021-09-26 NOTE — ED Triage Notes (Addendum)
Pt c/o CP and bilat LE pain x 2 week-states he was seen by PCP today-was called and advised elevated ddimer and to come to ED-NAD-steady gait

## 2021-09-26 NOTE — ED Notes (Signed)
Patient transported to CT 

## 2021-09-26 NOTE — ED Notes (Signed)
Patient transported to X-ray 

## 2021-09-27 ENCOUNTER — Inpatient Hospital Stay (HOSPITAL_COMMUNITY): Payer: BC Managed Care – PPO

## 2021-09-27 ENCOUNTER — Encounter (HOSPITAL_COMMUNITY): Payer: Self-pay | Admitting: Family Medicine

## 2021-09-27 DIAGNOSIS — I248 Other forms of acute ischemic heart disease: Secondary | ICD-10-CM | POA: Diagnosis present

## 2021-09-27 DIAGNOSIS — E6609 Other obesity due to excess calories: Secondary | ICD-10-CM

## 2021-09-27 DIAGNOSIS — E1169 Type 2 diabetes mellitus with other specified complication: Secondary | ICD-10-CM | POA: Diagnosis not present

## 2021-09-27 DIAGNOSIS — I5021 Acute systolic (congestive) heart failure: Secondary | ICD-10-CM

## 2021-09-27 DIAGNOSIS — Z66 Do not resuscitate: Secondary | ICD-10-CM | POA: Diagnosis present

## 2021-09-27 DIAGNOSIS — Z888 Allergy status to other drugs, medicaments and biological substances status: Secondary | ICD-10-CM | POA: Diagnosis not present

## 2021-09-27 DIAGNOSIS — E669 Obesity, unspecified: Secondary | ICD-10-CM | POA: Diagnosis present

## 2021-09-27 DIAGNOSIS — I110 Hypertensive heart disease with heart failure: Secondary | ICD-10-CM | POA: Diagnosis present

## 2021-09-27 DIAGNOSIS — E785 Hyperlipidemia, unspecified: Secondary | ICD-10-CM | POA: Diagnosis present

## 2021-09-27 DIAGNOSIS — Z6832 Body mass index (BMI) 32.0-32.9, adult: Secondary | ICD-10-CM

## 2021-09-27 DIAGNOSIS — E1142 Type 2 diabetes mellitus with diabetic polyneuropathy: Secondary | ICD-10-CM | POA: Diagnosis present

## 2021-09-27 DIAGNOSIS — I5043 Acute on chronic combined systolic (congestive) and diastolic (congestive) heart failure: Secondary | ICD-10-CM | POA: Diagnosis not present

## 2021-09-27 DIAGNOSIS — I509 Heart failure, unspecified: Secondary | ICD-10-CM | POA: Diagnosis not present

## 2021-09-27 DIAGNOSIS — Z20822 Contact with and (suspected) exposure to covid-19: Secondary | ICD-10-CM | POA: Diagnosis present

## 2021-09-27 DIAGNOSIS — N400 Enlarged prostate without lower urinary tract symptoms: Secondary | ICD-10-CM | POA: Diagnosis present

## 2021-09-27 DIAGNOSIS — E11319 Type 2 diabetes mellitus with unspecified diabetic retinopathy without macular edema: Secondary | ICD-10-CM | POA: Diagnosis present

## 2021-09-27 DIAGNOSIS — G4733 Obstructive sleep apnea (adult) (pediatric): Secondary | ICD-10-CM | POA: Diagnosis present

## 2021-09-27 DIAGNOSIS — I10 Essential (primary) hypertension: Secondary | ICD-10-CM | POA: Diagnosis present

## 2021-09-27 DIAGNOSIS — I255 Ischemic cardiomyopathy: Secondary | ICD-10-CM | POA: Diagnosis present

## 2021-09-27 DIAGNOSIS — Z79899 Other long term (current) drug therapy: Secondary | ICD-10-CM | POA: Diagnosis not present

## 2021-09-27 DIAGNOSIS — I2510 Atherosclerotic heart disease of native coronary artery without angina pectoris: Secondary | ICD-10-CM | POA: Diagnosis present

## 2021-09-27 DIAGNOSIS — E119 Type 2 diabetes mellitus without complications: Secondary | ICD-10-CM | POA: Diagnosis not present

## 2021-09-27 DIAGNOSIS — I5041 Acute combined systolic (congestive) and diastolic (congestive) heart failure: Secondary | ICD-10-CM | POA: Diagnosis present

## 2021-09-27 DIAGNOSIS — Z7984 Long term (current) use of oral hypoglycemic drugs: Secondary | ICD-10-CM | POA: Diagnosis not present

## 2021-09-27 DIAGNOSIS — R0602 Shortness of breath: Secondary | ICD-10-CM | POA: Diagnosis present

## 2021-09-27 DIAGNOSIS — N179 Acute kidney failure, unspecified: Secondary | ICD-10-CM | POA: Diagnosis present

## 2021-09-27 DIAGNOSIS — Z8249 Family history of ischemic heart disease and other diseases of the circulatory system: Secondary | ICD-10-CM | POA: Diagnosis not present

## 2021-09-27 LAB — RESP PANEL BY RT-PCR (FLU A&B, COVID) ARPGX2
Influenza A by PCR: NEGATIVE
Influenza B by PCR: NEGATIVE
SARS Coronavirus 2 by RT PCR: NEGATIVE

## 2021-09-27 LAB — LIPID PANEL
Cholesterol: 195 mg/dL (ref 0–200)
HDL: 51 mg/dL (ref >40)
LDL Cholesterol: 136 mg/dL — ABNORMAL HIGH (ref 0–99)
Total CHOL/HDL Ratio: 3.8 RATIO
Triglycerides: 38 mg/dL (ref <150)
VLDL: 8 mg/dL (ref 0–40)

## 2021-09-27 LAB — ECHOCARDIOGRAM COMPLETE
Area-P 1/2: 5.27 cm2
Height: 69 in
S' Lateral: 4.1 cm
Single Plane A4C EF: 31.8 %
Weight: 3527.36 oz

## 2021-09-27 LAB — HEMOGLOBIN A1C
Hgb A1c MFr Bld: 6.8 % — ABNORMAL HIGH (ref 4.8–5.6)
Mean Plasma Glucose: 148.46 mg/dL

## 2021-09-27 LAB — HIV ANTIBODY (ROUTINE TESTING W REFLEX): HIV Screen 4th Generation wRfx: NONREACTIVE

## 2021-09-27 LAB — GLUCOSE, CAPILLARY: Glucose-Capillary: 145 mg/dL — ABNORMAL HIGH (ref 70–99)

## 2021-09-27 LAB — TROPONIN I (HIGH SENSITIVITY): Troponin I (High Sensitivity): 56 ng/L — ABNORMAL HIGH (ref <18)

## 2021-09-27 MED ORDER — ACETAMINOPHEN 650 MG RE SUPP: 650.0000 mg | Freq: Four times a day (QID) | RECTAL | Status: DC | PRN

## 2021-09-27 MED ORDER — SODIUM CHLORIDE 0.9% FLUSH
3.0000 mL | Freq: Two times a day (BID) | INTRAVENOUS | Status: DC
  Administered 2021-09-27 – 2021-09-30 (×7): 3 mL via INTRAVENOUS

## 2021-09-27 MED ORDER — ONDANSETRON HCL 4 MG PO TABS: 4.0000 mg | ORAL_TABLET | Freq: Four times a day (QID) | ORAL | Status: DC | PRN

## 2021-09-27 MED ORDER — IRBESARTAN 150 MG PO TABS: 150.0000 mg | ORAL_TABLET | Freq: Every day | ORAL | Status: DC

## 2021-09-27 MED ORDER — OXYCODONE HCL 5 MG PO TABS
5.0000 mg | ORAL_TABLET | ORAL | Status: DC | PRN
  Administered 2021-09-28 – 2021-09-30 (×6): 5 mg via ORAL
  Filled 2021-09-27 (×6): qty 1

## 2021-09-27 MED ORDER — BISACODYL 5 MG PO TBEC: 5.0000 mg | DELAYED_RELEASE_TABLET | Freq: Every day | ORAL | Status: DC | PRN

## 2021-09-27 MED ORDER — ACETAMINOPHEN 325 MG PO TABS: 650.0000 mg | ORAL_TABLET | Freq: Four times a day (QID) | ORAL | Status: DC | PRN

## 2021-09-27 MED ORDER — DOCUSATE SODIUM 100 MG PO CAPS
100.0000 mg | ORAL_CAPSULE | Freq: Two times a day (BID) | ORAL | Status: DC
  Administered 2021-09-27 – 2021-09-30 (×6): 100 mg via ORAL
  Filled 2021-09-27 (×7): qty 1

## 2021-09-27 MED ORDER — ONDANSETRON HCL 4 MG/2ML IJ SOLN: 4.0000 mg | Freq: Four times a day (QID) | INTRAMUSCULAR | Status: DC | PRN

## 2021-09-27 MED ORDER — MORPHINE SULFATE (PF) 2 MG/ML IV SOLN: 2.0000 mg | INTRAVENOUS | Status: DC | PRN

## 2021-09-27 MED ORDER — POLYETHYLENE GLYCOL 3350 17 G PO PACK: 17.0000 g | PACK | Freq: Every day | ORAL | Status: DC | PRN

## 2021-09-27 MED ORDER — FUROSEMIDE 10 MG/ML IJ SOLN: 40.0000 mg | Freq: Two times a day (BID) | INTRAMUSCULAR | Status: DC

## 2021-09-27 MED ORDER — SACUBITRIL-VALSARTAN 24-26 MG PO TABS
1.0000 | ORAL_TABLET | Freq: Two times a day (BID) | ORAL | Status: DC
  Administered 2021-09-27 – 2021-09-30 (×6): 1 via ORAL
  Filled 2021-09-27 (×6): qty 1

## 2021-09-27 MED ORDER — FUROSEMIDE 10 MG/ML IJ SOLN
80.0000 mg | Freq: Two times a day (BID) | INTRAMUSCULAR | Status: DC
  Administered 2021-09-27 – 2021-09-28 (×3): 80 mg via INTRAVENOUS
  Filled 2021-09-27 (×4): qty 8

## 2021-09-27 MED ORDER — ENOXAPARIN SODIUM 40 MG/0.4ML IJ SOSY
40.0000 mg | PREFILLED_SYRINGE | INTRAMUSCULAR | Status: DC
  Administered 2021-09-27 – 2021-09-28 (×2): 40 mg via SUBCUTANEOUS
  Filled 2021-09-27 (×2): qty 0.4

## 2021-09-27 MED ORDER — HYDRALAZINE HCL 20 MG/ML IJ SOLN: 5.0000 mg | INTRAMUSCULAR | Status: DC | PRN

## 2021-09-27 MED ORDER — GABAPENTIN 300 MG PO CAPS
300.0000 mg | ORAL_CAPSULE | Freq: Every day | ORAL | Status: DC
  Administered 2021-09-27 – 2021-09-29 (×3): 300 mg via ORAL
  Filled 2021-09-27 (×3): qty 1

## 2021-09-27 NOTE — Progress Notes (Signed)
Pt arrived to unit from [facility] . VSS, A/O x 4,  CCMD called ,CHG given, pt oriented to unit,Will continue to monitor. No pain pt complain SHOB placed on 1L [place]  Karna Christmas Khaliya Golinski, RN     09/27/21 1100  Vitals  Temp 98.1 F (36.7 C)  Temp Source Oral  BP (!) 158/95  MAP (mmHg) 114  BP Location Left Arm  BP Method Automatic  Patient Position (if appropriate) Lying  Pulse Rate 98  Pulse Rate Source Monitor  ECG Heart Rate 97  Resp 18  Level of Consciousness  Level of Consciousness Alert  Oxygen Therapy  SpO2 97 %  O2 Device Nasal Cannula  O2 Flow Rate (L/min) 1 L/min  Pain Assessment  Pain Scale 0-10  Pain Score 0  Height and Weight  Height 5\' 9"  (1.753 m)  Weight 100 kg  BSA (Calculated - sq m) 2.21 sq meters  BMI (Calculated) 32.54  Weight in (lb) to have BMI = 25 168.9  MEWS Score  MEWS Temp 0  MEWS Systolic 0  MEWS Pulse 0  MEWS RR 0  MEWS LOC 0  MEWS Score 0  MEWS Score Color Green

## 2021-09-27 NOTE — Plan of Care (Signed)
  Problem: Education: Goal: Knowledge of General Education information will improve Description: Including pain rating scale, medication(s)/side effects and non-pharmacologic comfort measures Outcome: Progressing   Problem: Clinical Measurements: Goal: Ability to maintain clinical measurements within normal limits will improve Outcome: Progressing Goal: Will remain free from infection Outcome: Progressing Goal: Diagnostic test results will improve Outcome: Progressing Goal: Respiratory complications will improve Outcome: Progressing Goal: Cardiovascular complication will be avoided Outcome: Progressing   Problem: Education: Goal: Ability to demonstrate management of disease process will improve Outcome: Progressing Goal: Ability to verbalize understanding of medication therapies will improve Outcome: Progressing Goal: Individualized Educational Video(s) Outcome: Progressing   Problem: Activity: Goal: Capacity to carry out activities will improve Outcome: Progressing   Problem: Cardiac: Goal: Ability to achieve and maintain adequate cardiopulmonary perfusion will improve Outcome: Progressing   

## 2021-09-27 NOTE — Progress Notes (Signed)
Nutrition Education Note  RD consulted for nutrition education regarding new onset CHF. Patient reports usual intake of fast food for most meals. Discussed ways to plan for and choose heart healthy meals with lower sodium.  RD provided "Low Sodium Nutrition Therapy" handout from the Academy of [employer] and Dietetics. Reviewed patient's dietary recall. Provided examples on ways to decrease sodium intake in diet. Discouraged intake of processed foods and use of salt shaker. Encouraged fresh fruits and vegetables as well as whole grain sources of carbohydrates to maximize fiber intake.   RD discussed why it is important for patient to adhere to diet recommendations, and emphasized the role of fluids, foods to avoid, and importance of weighing self daily. Teach back method used.  Expect good compliance.  Body mass index is 32.56 kg/m. Pt meets criteria for obesity based on current BMI.  Current diet order is heart healthy with 1500 ml fluid restriction, patient is consuming approximately 100% of meals at this time. Labs and medications reviewed. No further nutrition interventions warranted at this time. RD contact information provided. If additional nutrition issues arise, please re-consult RD.    Gabriel Rainwater, RD, LDN, CNSC Please refer to [facility] for contact information.

## 2021-09-27 NOTE — Progress Notes (Signed)
Heart Failure Navigator Progress Note  Assessed for Heart & Vascular TOC clinic readiness.  Patient does not meet criteria due to AHF rounding team consulted this hospitalization.  EF 30-35%, G2DD. (NEW)   Navigator available for reassessment of patient.   Education Assessment and Provision:  Detailed education and instructions provided on heart failure disease management including the following:  Signs and symptoms of Heart Failure When to call the physician Importance of daily weights Low sodium diet Fluid restriction Medication management Anticipated future follow-up appointments  Patient education given on each of the above topics.  Patient acknowledges understanding via teach back method and acceptance of all instructions.  Education Materials:  "Living Better With Heart Failure" Booklet, HF zone tool, & Daily Weight Tracker Tool.  Patient has scale at home: no, spouse states she will get one.  Patient has pill box at home: yes    Ozella Rocks, MSN, RN Heart Failure Nurse Navigator [phone]

## 2021-09-27 NOTE — ED Notes (Signed)
Pt reports he feels increased work of breathing. Pt room air is 98%. RT made aware and at bedside with patient.

## 2021-09-27 NOTE — ED Notes (Signed)
Attempted to call report; this RN contact info provided for callback 

## 2021-09-27 NOTE — Consult Note (Addendum)
Advanced Heart Failure Team Consult Note   Primary Physician: Antony Contras, MD PCP-Cardiologist:  None  Reason for Consultation: Acute systolic HF  HPI:    Brett Walker is seen today for evaluation of acute systolic HF at the request of Dr. Lorin Mercy with [facility]. 60 y.o. male with history of DM, HTN, HLD, obesity, hx alcohol abuse.   Presented to ED yesterday at direction of PCP to r/o PE. Complained of CP, SOB and lower extremity edema X 1-2 weeks. + orthopnea and PND. Ddimer apparently 0.67.  Sinus tachycardia on presentation. BP 171/107 mmHg. Has been hypertensive. Bilateral pleural effusions and mild vascular congestion on Chest xray. Labs significant for HS troponin 55>56, BNP 1195, Hgb 12.6, WBC WNL, Scr 1.15, Na 138, K 4.7, HCO3 21. Negative for influenza and COVID-19. CTA negative for PE but incidentally showed large bilateral pleural effusions. Venous duplex negative for DVT.  She was admitted under [facility] for management of acute systolic HF.  Initiated on IV lasix. Voided 3.1L since last night.  Echo with EF 30-35%, RV okay, mild MR  Father has CAD and recently had CABG at age of 91, paternal grandfather had HF.  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   Previously consumed heavy amounts of alcohol, minimal alcohol last 3-4 years. No Illicit drug use or tobacco use.   Review of Systems: [y] = yes, [ ]  = no   General: Weight gain [ ] ; Weight loss [ ] ; Anorexia [ ] ; Fatigue [Y]; Fever [ ] ; Chills [ ] ; Weakness [ ]   Cardiac: Chest pain/pressure [Y]; Resting SOB [Y]; Exertional SOB [Y]; Orthopnea [Y]; Pedal Edema [Y]; Palpitations [ ] ; Syncope [ ] ; Presyncope [ ] ; Paroxysmal nocturnal dyspnea[ ]   Pulmonary: Cough [ ] ; Wheezing[ ] ; Hemoptysis[ ] ; Sputum [ ] ; Snoring [Y]  GI: Vomiting[ ] ; Dysphagia[ ] ; Melena[ ] ; Hematochezia [ ] ; Heartburn[ ] ; Abdominal pain [ ] ; Constipation [ ] ; Diarrhea [ ] ; BRBPR [ ]   GU: Hematuria[ ] ;  Dysuria [ ] ; Nocturia[ ]   Vascular: Pain in legs with walking [ ] ; Pain in feet with lying flat [ ] ; Non-healing sores [ ] ; Stroke [ ] ; TIA [ ] ; Slurred speech [ ] ;  Neuro: Headaches[ ] ; Vertigo[ ] ; Seizures[ ] ; Paresthesias[ ] ;Blurred vision [ ] ; Diplopia [ ] ; Vision changes [ ]   Ortho/Skin: Arthritis [ ] ; Joint pain [ ] ; Muscle pain [ ] ; Joint swelling [ ] ; Back Pain [ ] ; Rash [ ]   Psych: Depression[ ] ; Anxiety[ ]   Heme: Bleeding problems [ ] ; Clotting disorders [ ] ; Anemia [ ]   Endocrine: Diabetes [Y]; Thyroid dysfunction[ ]   Home Medications Prior to Admission medications   Medication Sig Start Date End Date Taking? Authorizing Provider  bisacodyl (DULCOLAX) 5 MG EC tablet Take 5 mg by mouth daily as needed for moderate constipation.   Yes [provider]  gabapentin (NEURONTIN) 300 MG capsule Take 300 mg by mouth at bedtime.   Yes [provider]  metFORMIN (GLUCOPHAGE-XR) 750 MG 24 hr tablet Take 750 mg by mouth 2 (two) times daily. 09/05/21  Yes [provider]  olmesartan (BENICAR) 20 MG tablet Take 20 mg by mouth daily. 09/06/21  Yes [provider]  furosemide (LASIX) 20 MG tablet Take 20 mg by mouth daily. Patient not taking: Reported on 09/27/2021 09/26/21   [provider]    Past Medical History: Past Medical History:  Diagnosis Date   Diabetes mellitus without complication ([place])  Diabetic peripheral neuropathy ([facility])    Diabetic retinopathy ([facility])    Diverticulitis    Elevated LDL cholesterol level    Hypertension    Pharyngoesophageal dysphagia     Past Surgical History: Past Surgical History:  Procedure Laterality Date   EYE SURGERY     WISDOM TOOTH EXTRACTION      Family History: Family History  Problem Relation Age of Onset   Parkinson's disease Father    CAD Father    CAD Paternal Grandfather     Social History: Social History   Socioeconomic History   Marital status: Single    Spouse name: Not on file    Number of children: 0   Years of education: 14   Highest education level: Not on file  Occupational History   Occupation: [employer]  Tobacco Use   Smoking status: Never   Smokeless tobacco: Never  Vaping Use   Vaping Use: Never used  Substance and Sexual Activity   Alcohol use: Not Currently    Comment: h/o heavy use   Drug use: Never   Sexual activity: Not on file  Other Topics Concern   Not on file  Social History Narrative   Works at [employer]   Right handed   First floor apartment   Social Determinants of Health   Financial Resource Strain: Not on file  Food Insecurity: Not on file  Transportation Needs: Not on file  Physical Activity: Not on file  Stress: Not on file  Social Connections: Not on file    Allergies:  Allergies  Allergen Reactions   Penicillin G Hives   Atorvastatin     Other reaction(s): fecal urgency   Crestor [Rosuvastatin]     Other reaction(s): GI cramping   Lipitor [Atorvastatin Calcium] Diarrhea   Ozempic (0.25 Or 0.5 Mg-Dose) [Semaglutide(0.25 Or 0.5mg -Dos)] Diarrhea    Objective:    Vital Signs:   Temp:  [98.1 F (36.7 C)-98.6 F (37 C)] 98.1 F (36.7 C) (12/07 1100) Pulse Rate:  [86-105] 98 (12/07 1100) Resp:  [16-25] 18 (12/07 1100) BP: (132-171)/(77-112) 158/95 (12/07 1100) SpO2:  [91 %-97 %] 97 % (12/07 1100) Weight:  [100 kg-105.2 kg] 100 kg (12/07 1100) Last BM Date: 09/27/21  Weight change: Filed Weights   09/26/21 2030 09/27/21 1100  Weight: 105.2 kg 100 kg    Intake/Output:   Intake/Output Summary (Last 24 hours) at 09/27/2021 1403 Last data filed at 09/27/2021 0816 Gross per 24 hour  Intake --  Output 3360 ml  Net -3360 ml      Physical Exam    General:  Comfortable sitting up in bed HEENT: normal Neck: supple. JVP 12 cm. Carotids 2+ bilat; no bruits.  Cor: PMI nondisplaced. Regular rate & rhythm. No rubs, gallops or murmurs. Lungs: diminished in bases Abdomen: soft, nontender, nondistended. No  hepatosplenomegaly. No bruits or masses. Good bowel sounds. Extremities: no cyanosis, clubbing, rash, 1-2 + edema Neuro: alert & orientedx3, cranial nerves grossly intact. moves all 4 extremities w/o difficulty. Affect pleasant   Telemetry   NSR 80s-90s (personally reviewed)  EKG    12/06 8:34 pm: Sinus tachycardia 103 bpm, ST depression inferolateral leads, delayed R wave progression precordial leads  Labs   Basic Metabolic Panel: Recent Labs  Lab 09/26/21 2216  NA 138  K 4.7  CL 111  CO2 21*  GLUCOSE 148*  BUN 23*  CREATININE 1.15  CALCIUM 8.5*    Liver Function Tests: Recent Labs  Lab 09/26/21  2216  AST 16  ALT 21  ALKPHOS 56  BILITOT 0.5  PROT 6.0*  ALBUMIN 3.3*   No results for input(s): LIPASE, AMYLASE in the last 168 hours. No results for input(s): AMMONIA in the last 168 hours.  CBC: Recent Labs  Lab 09/26/21 2216  WBC 5.8  NEUTROABS 3.8  HGB 12.6*  HCT 36.7*  MCV 89.3  PLT 214    Cardiac Enzymes: No results for input(s): CKTOTAL, CKMB, CKMBINDEX, TROPONINI in the last 168 hours.  BNP: BNP (last 3 results) Recent Labs    09/26/21 2216  BNP 1,194.7*    ProBNP (last 3 results) No results for input(s): PROBNP in the last 8760 hours.   CBG: Recent Labs  Lab 09/27/21 1148  GLUCAP 145*    Coagulation Studies: No results for input(s): LABPROT, INR in the last 72 hours.   Imaging   DG Chest 2 View  Result Date: 09/26/2021 CLINICAL DATA:  Shortness of breath. EXAM: CHEST - 2 VIEW COMPARISON:  None. FINDINGS: Small bilateral pleural effusions and bibasilar atelectasis or infiltrate. There is mild cardiomegaly with mild vascular congestion. No pneumothorax. No acute osseous pathology. Degenerative changes of spine. IMPRESSION: 1. Small bilateral pleural effusions and bibasilar atelectasis or infiltrate. 2. Mild cardiomegaly with mild vascular congestion. Electronically Signed   By: Elgie Collard M.D.   On: 09/26/2021 22:24   CT  Angio Chest PE W/Cm &/Or Wo Cm  Result Date: 09/26/2021 CLINICAL DATA:  Lower extremity pain and swelling for 2 weeks with shortness of breath, initial encounter EXAM: CT ANGIOGRAPHY CHEST WITH CONTRAST TECHNIQUE: Multidetector CT imaging of the chest was performed using the standard protocol during bolus administration of intravenous contrast. Multiplanar CT image reconstructions and MIPs were obtained to evaluate the vascular anatomy. CONTRAST:  72mL OMNIPAQUE IOHEXOL 350 MG/ML SOLN COMPARISON:  Chest x-ray from earlier in the same day. FINDINGS: Cardiovascular: Thoracic aorta shows a normal enhancement pattern. No aneurysmal dilatation or dissection is noted. Coronary calcifications are seen. No cardiac enlargement is noted. Pulmonary artery shows a normal branching pattern without intraluminal filling defect to suggest pulmonary embolism. Mediastinum/Nodes: Thoracic inlet is within normal limits. The esophagus as visualized is within normal limits. No sizable hilar or mediastinal adenopathy is noted. Lungs/Pleura: Large bilateral pleural effusions are noted. Lower lobe atelectatic changes are noted in a compensatory fashion. No focal confluent infiltrate is seen. No sizable parenchymal nodule is noted. Upper Abdomen: No acute abnormality. Musculoskeletal: Degenerative changes of the thoracic spine are noted. No acute rib abnormality is noted. Review of the MIP images confirms the above findings. IMPRESSION: No evidence pulmonary embolism. Large bilateral pleural effusions with compensatory lower lobe atelectatic changes. Electronically Signed   By: Alcide Clever M.D.   On: 09/26/2021 23:23   US Venous Img Lower Bilateral  Result Date: 09/26/2021 CLINICAL DATA:  Shortness of breath. EXAM: BILATERAL LOWER EXTREMITY VENOUS DOPPLER ULTRASOUND TECHNIQUE: Gray-scale sonography with compression, as well as color and duplex ultrasound, were performed to evaluate the deep venous system(s) from the level of the  common femoral vein through the popliteal and proximal calf veins. COMPARISON:  None. FINDINGS: VENOUS Normal compressibility of the common femoral, superficial femoral, and popliteal veins, as well as the visualized calf veins. Visualized portions of profunda femoral vein and great saphenous vein unremarkable. No filling defects to suggest DVT on grayscale or color Doppler imaging. Doppler waveforms show normal direction of venous flow, normal respiratory plasticity and response to augmentation. OTHER None. Limitations: none IMPRESSION: Negative. Electronically  Signed   By: Ronney Asters M.D.   On: 09/26/2021 23:14     Medications:     Current Medications:  docusate sodium  100 mg Oral BID   enoxaparin (LOVENOX) injection  40 mg Subcutaneous Q24H   furosemide  40 mg Intravenous BID   gabapentin  300 mg Oral QHS   irbesartan  150 mg Oral Daily   sodium chloride flush  3 mL Intravenous Q12H    Infusions:     Patient Profile   60 y.o. male with history of HTN, HLD, DM, obesity admitted with acute HF.  Assessment/Plan  Acute systolic HF: -presenting with 1-2 weeks chest discomfort and dyspnea.  -Echo with EF 30-35%, RV okay, mild MR -Etiology not certain. Previously consumed large amounts of alcohol, minimal intake last few years. No illicit drug use. SR/Sinus tach here, no arrhythmias. -Will need R/LHC this admit. Coronary calcifications noted on CTA yesterday. If no obstructive CAD on LHC, obtain cMRI. -BNP 1,195  -NYHA IIIb. Appears volume overloaded. Increase IV lasix to 80 mg BID -Hold off on beta blocker for now d/t acute heart failure exacerbation -On avapro 150 mg daily (home med), will switch to entresto 24/26 mg BID -Add spiro 12.5 if K stable tomorrow -Add SGLT2i this admit. A1c 6.8 -[name] -Reports snoring. Will need sleep study.  2. Sinus tachycardia -Likely d/t HF  3. Bilateral  pleural effusions -Appeared large in size on CTA  4. DM: -On metformin PTA -A1c  6.8 -Management per [facility]  5. HTN: -BP elevated -Medications as above  6. HLD: -Lipid panel pending -With history of DM, would benefit from addition of statin or PCSK-9i depending on LHC -GI upset with atorvastatin and crestor  7. Obesity: -Body mass index is 32.56 kg/m.  7. Hx alcohol abuse -Previously consumed large quantities of alcohol, minimal intake last few years  8. Abnormal imaging -CTA 08/2019 for abdominal pain and weight loss: Enlarged prostate, irrgular opacity greater curvature body of stomach, evidence of residual sclerosing mesenteritis, thickening of several loops of jejunum likely d/t enteritis -Has not had f/u on above findings -TRH working up -PSA pending   Currently DNR. Briefly discussed code status and goals of care. Recommend full code.   Length of Stay: 0  FINCH, LINDSAY N, PA-C  09/27/2021, 2:03 PM  Advanced Heart Failure Team Pager [phone] (M-F; 7a - 5p)  Please contact [facility] for night-coverage after hours (4p -7a ) and weekends on [email or web address]   Patient seen and examined with the above-signed Advanced Practice Provider and/or Housestaff. I personally reviewed laboratory data, imaging studies and relevant notes. I independently examined the patient and formulated the important aspects of the plan. I have edited the note to reflect any of my changes or salient points. I have personally discussed the plan with the patient and/or family.  60 y/o male male with DM2, OSA admitted with acute systolic HF and CP.  ECG and troponin not acute  Echo EF 35-40%   General: Sitting up in bed  No resp difficulty HEENT: normal Neck: supple. JVP to ear Carotids 2+ bilat; no bruits. No lymphadenopathy or thryomegaly appreciated. Cor: PMI nondisplaced. Regular rate & rhythm. No rubs, gallops or murmurs. Lungs: crackles at bases Abdomen: soft, nontender, nondistended. No hepatosplenomegaly. No bruits or masses. Good bowel sounds. Extremities: no cyanosis,  clubbing, rash, 3+ edema Neuro: alert & orientedx3, cranial nerves grossly intact. moves all 4 extremities w/o difficulty. Affect pleasant  Acute systolic HF. Significant coronary calcium on  CT. Suspect iCM.  Plan IV diuresis. Titrate GDMT. R/L cath when volume status improved.   Glori Bickers, MD  5:59 PM

## 2021-09-27 NOTE — Evaluation (Signed)
Physical Therapy Evaluation & Discharge Patient Details Name: Brett Walker MRN: [id number] DOB: 1961/01/04 Today's Date: 09/27/2021  History of Present Illness  Pt is a 60 y.o. male admitted 09/26/21 with SOB, chest tightness, BLE swelling. Workup for new onset CHF, bilateral large pleural effusions. Echo with EF 30-35%, RV okay, mild MR. PMH includes DM, HTN, retinopathy, peripheral neuropathy.   Clinical Impression  Patient evaluated by Physical Therapy with no further acute PT needs identified. PTA, pt independent, working, drives, lives alone; reports primarily sedentary outside of active job. Today, pt independent with mobility. Educ re: activity recommendations with heart failure, including walking program, activity pacing, pulse monitoring. All education has been completed and the patient has no further questions. Encouraged more frequent out of room mobility while admitted. Acute PT is signing off. Thank you for this referral.     SpO2 90-96% on RA with activity, HR 112 with ambulation   Recommendations for follow up therapy are one component of a multi-disciplinary discharge planning process, led by the attending physician.  Recommendations may be updated based on patient status, additional functional criteria and insurance authorization.  Follow Up Recommendations No PT follow up    Assistance Recommended at Discharge None  Functional Status Assessment    Equipment Recommendations  None recommended by PT    Recommendations for Other Services       Precautions / Restrictions Precautions Precautions: None Restrictions Weight Bearing Restrictions: No      Mobility  Bed Mobility Overal bed mobility: Independent                  Transfers Overall transfer level: Independent                      Ambulation/Gait Ambulation/Gait assistance: Independent Gait Distance (Feet): 500 Feet Assistive device: None Gait Pattern/deviations: WFL(Within  Functional Limits)   Gait velocity interpretation: >2.62 ft/sec, indicative of community ambulatory   General Gait Details: Independent without DME; 1x standing rest break secondary to SOB, cues for pursed lip breathing and activity pacing  Stairs Stairs:  (pt declined)          Wheelchair Mobility    Modified Rankin (Stroke Patients Only)       Balance Overall balance assessment: Independent                                           Pertinent Vitals/Pain Pain Assessment: No/denies pain    Home Living Family/patient expects to be discharged to:: Private residence Living Arrangements: Alone Available Help at Discharge: Family;Available PRN/intermittently Type of Home: Apartment Home Access: Level entry       Home Layout: One level Home Equipment: None      Prior Function Prior Level of Function : Independent/Modified Independent;Working/employed             Mobility Comments: Independent without DME, drives, 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 Reports job is on his feet a lot, but primarily sedentary at home       Hand Dominance        Extremity/Trunk Assessment   Upper Extremity Assessment Upper Extremity Assessment: Overall WFL for tasks assessed    Lower Extremity Assessment Lower Extremity Assessment: Overall WFL for tasks assessed       Communication   Communication: No difficulties  Cognition Arousal/Alertness: Awake/alert Behavior During Therapy: WFL for tasks assessed/performed;Flat affect Overall  Cognitive Status: Within Functional Limits for tasks assessed                                 General Comments: Pt reports feeling stressed regarding information and symptoms        General Comments General comments (skin integrity, edema, etc.): Educ pt and sister Selena Batten) on heart failure activity recommendations, including walking program, activity pacing and pulse ox monitoring    Exercises      Assessment/Plan    PT Assessment Patient does not need any further PT services  PT Problem List         PT Treatment Interventions      PT Goals (Current goals can be found in the Care Plan section)  Acute Rehab PT Goals PT Goal Formulation: All assessment and education complete, DC therapy    Frequency     Barriers to discharge        Co-evaluation               AM-PAC PT "6 Clicks" Mobility  Outcome Measure Help needed turning from your back to your side while in a flat bed without using bedrails?: None Help needed moving from lying on your back to sitting on the side of a flat bed without using bedrails?: None Help needed moving to and from a bed to a chair (including a wheelchair)?: None Help needed standing up from a chair using your arms (e.g., wheelchair or bedside chair)?: None Help needed to walk in hospital room?: None Help needed climbing 3-5 steps with a railing? : None 6 Click Score: 24    End of Session Equipment Utilized During Treatment: Gait belt Activity Tolerance: Patient tolerated treatment well Patient left: in bed;with call bell/phone within reach;with family/visitor present Nurse Communication: Mobility status PT Visit Diagnosis: Other abnormalities of gait and mobility (R26.89)    Time: 4097-3532 PT Time Calculation (min) (ACUTE ONLY): 16 min   Charges:   PT Evaluation $PT Eval Low Complexity: 1 Low     Ina Homes, PT, DPT Acute Rehabilitation Services  Pager [phone] Office [phone]  Malachy Chamber 09/27/2021, 4:41 PM

## 2021-09-27 NOTE — ED Notes (Signed)
Placed on 2lpm Page for sob, SpO2 92-96%, talking in complete sentences.

## 2021-09-27 NOTE — H&P (Signed)
History and Physical    Brett Walker YDX:[id number] DOB: [birth year] DOA: 09/26/2021  PCP: [name], MD Consultants:  Avernini - endocrinology; Allena Katz - retina Patient coming from:  Home - lives alone; NOK: Kristine Garbe, sister, [phone]  Chief Complaint: SOB  HPI: Brett Walker is a 60 y.o. male with medical history significant of DM; HTN; and HLD presenting with SOB. For the last 1-2 weeks he has had tightness in his chest, difficulty catching his breath.  Over the weekend, his feet and calves were swelling and he was unable to sleep.  He went to his PCP Tuesday afternoon and a blood test was abnormal (D-dimer) and they called and told him to go to the ER.  He feels ok now.  He is mildly SOB, no chest tightness.  +orthopnea x 2 weeks.  He was getting injections in his eyes and was told to sleep at an angle.  He tried sleeping flat and it didn't work at all.  +PND.  No personal history of CAD or CHF.    ED Course: [facility] to [facility] transfer, per Dr. Rachael Darby:  60 yo male with edema of legs and SOB worse with exertion for past 1-2 weeks.  Has new onset CHF with bilateral large pleural effusions.  Will need echo in am. May need IR for thoracentesis.   Review of Systems: As per HPI; otherwise review of systems reviewed and negative.   Ambulatory Status:  Ambulates without assistance  COVID Vaccine Status:  Complete - J&J x 1  Past Medical History:  Diagnosis Date   Diabetes mellitus without complication (HCC)    Diabetic peripheral neuropathy (HCC)    Diabetic retinopathy (HCC)    Diverticulitis    Elevated LDL cholesterol level    Hypertension    Pharyngoesophageal dysphagia     Past Surgical History:  Procedure Laterality Date   EYE SURGERY     WISDOM TOOTH EXTRACTION      Social History   Socioeconomic History   Marital status: Single    Spouse name: Not on file   Number of children: 0   Years of education: 14   Highest education level: Not on file   Occupational History   Occupation: [employer]  Tobacco Use   Smoking status: Never   Smokeless tobacco: Never  Vaping Use   Vaping Use: Never used  Substance and Sexual Activity   Alcohol use: Not Currently    Comment: h/o heavy use   Drug use: Never   Sexual activity: Not on file  Other Topics Concern   Not on file  Social History Narrative   Works at [employer]    Single   Right handed   First floor apartment   Social Determinants of Health   Financial Resource Strain: Not on file  Food Insecurity: Not on file  Transportation Needs: Not on file  Physical Activity: Not on file  Stress: Not on file  Social Connections: Not on file  Intimate Partner Violence: Not on file    Allergies  Allergen Reactions   Penicillin G Hives   Atorvastatin     Other reaction(s): fecal urgency   Crestor [Rosuvastatin]     Other reaction(s): GI cramping   Lipitor [Atorvastatin Calcium] Diarrhea   Ozempic (0.25 Or 0.5 Mg-Dose) [Semaglutide(0.25 Or 0.5mg -Dos)] Diarrhea    Family History  Problem Relation Age of Onset   Parkinson's disease Father    CAD Father    CAD Paternal Grandfather     Prior  to Admission medications   Medication Sig Start Date End Date Taking? Authorizing Provider  bisacodyl (DULCOLAX) 5 MG EC tablet Take 5 mg by mouth daily as needed for moderate constipation.    [provider]  furosemide (LASIX) 20 MG tablet Take 20 mg by mouth daily. 09/26/21   [provider]  gabapentin (NEURONTIN) 100 MG capsule Take 1 tablet at 6pm. 09/28/19   Patel, Roxana Hires K, DO  gabapentin (NEURONTIN) 300 MG capsule Take 300 mg by mouth once.    [provider]  metFORMIN (GLUCOPHAGE) 1000 MG tablet Take 1,000 mg by mouth daily. 09/23/19   [provider]  metFORMIN (GLUCOPHAGE-XR) 750 MG 24 hr tablet Take 750 mg by mouth 2 (two) times daily. 09/05/21   [provider]  olmesartan (BENICAR) 20 MG tablet Take 20 mg by mouth daily. 09/06/21   [provider]  polyethylene glycol-electrolytes (NULYTELY/GOLYTELY) 420 g solution Take 4,000 mLs by mouth once.    [provider]    Physical Exam: Vitals:   09/27/21 1610 09/27/21 0853 09/27/21 0858 09/27/21 1100  BP: (!) 159/106   (!) 158/95  Pulse: 95   98  Resp: (!) 23   18  Temp:  98.6 F (37 C)  98.1 F (36.7 C)  TempSrc:  Oral  Oral  SpO2: 96%  97% 97%  Weight:    100 kg  Height:     (1.753 m)     General:  Appears calm and comfortable and is in NAD, on [place] O2 Eyes:   EOMI, normal lids, iris ENT:  grossly normal hearing, lips & tongue, mmm; some absent dentition Neck:  no LAD, masses or thyromegaly Cardiovascular:  RRR, no m/r/g. 2+ LE edema.  Respiratory:   CTA bilaterally with no wheezes/rales/rhonchi.  Normal respiratory effort. Abdomen:  soft, NT, ND, NABS Skin:  no rash or induration seen on limited exam Musculoskeletal:  grossly normal tone BUE/BLE, good ROM, no bony abnormality Psychiatric:  grossly normal mood and affect, speech fluent and appropriate, AOx3 Neurologic:  CN 2-12 grossly intact, moves all extremities in coordinated fashion    Radiological Exams on Admission: Independently reviewed - see discussion in A/P where applicable  DG Chest 2 View  Result Date: 09/26/2021 CLINICAL DATA:  Shortness of breath. EXAM: CHEST - 2 VIEW COMPARISON:  None. FINDINGS: Small bilateral pleural effusions and bibasilar atelectasis or infiltrate. There is mild cardiomegaly with mild vascular congestion. No pneumothorax. No acute osseous pathology. Degenerative changes of spine. IMPRESSION: 1. Small bilateral pleural effusions and bibasilar atelectasis or infiltrate. 2. Mild cardiomegaly with mild vascular congestion. Electronically Signed   By: Elgie Collard M.D.   On: 09/26/2021 22:24   CT Angio Chest PE W/Cm &/Or Wo Cm  Result Date: 09/26/2021 CLINICAL DATA:  Lower extremity pain and swelling for 2 weeks with shortness of breath, initial encounter  EXAM: CT ANGIOGRAPHY CHEST WITH CONTRAST TECHNIQUE: Multidetector CT imaging of the chest was performed using the standard protocol during bolus administration of intravenous contrast. Multiplanar CT image reconstructions and MIPs were obtained to evaluate the vascular anatomy. CONTRAST:  75mL OMNIPAQUE IOHEXOL 350 MG/ML SOLN COMPARISON:  Chest x-ray from earlier in the same day. FINDINGS: Cardiovascular: Thoracic aorta shows a normal enhancement pattern. No aneurysmal dilatation or dissection is noted. Coronary calcifications are seen. No cardiac enlargement is noted. Pulmonary artery shows a normal branching pattern without intraluminal filling defect to suggest pulmonary embolism. Mediastinum/Nodes: Thoracic inlet is within normal limits. The esophagus as  visualized is within normal limits. No sizable hilar or mediastinal adenopathy is noted. Lungs/Pleura: Large bilateral pleural effusions are noted. Lower lobe atelectatic changes are noted in a compensatory fashion. No focal confluent infiltrate is seen. No sizable parenchymal nodule is noted. Upper Abdomen: No acute abnormality. Musculoskeletal: Degenerative changes of the thoracic spine are noted. No acute rib abnormality is noted. Review of the MIP images confirms the above findings. IMPRESSION: No evidence pulmonary embolism. Large bilateral pleural effusions with compensatory lower lobe atelectatic changes. Electronically Signed   By: Alcide Clever M.D.   On: 09/26/2021 23:23   US Venous Img Lower Bilateral  Result Date: 09/26/2021 CLINICAL DATA:  Shortness of breath. EXAM: BILATERAL LOWER EXTREMITY VENOUS DOPPLER ULTRASOUND TECHNIQUE: Gray-scale sonography with compression, as well as color and duplex ultrasound, were performed to evaluate the deep venous system(s) from the level of the common femoral vein through the popliteal and proximal calf veins. COMPARISON:  None. FINDINGS: VENOUS Normal compressibility of the common femoral, superficial femoral,  and popliteal veins, as well as the visualized calf veins. Visualized portions of profunda femoral vein and great saphenous vein unremarkable. No filling defects to suggest DVT on grayscale or color Doppler imaging. Doppler waveforms show normal direction of venous flow, normal respiratory plasticity and response to augmentation. OTHER None. Limitations: none IMPRESSION: Negative. Electronically Signed   By: Darliss Cheney M.D.   On: 09/26/2021 23:14    EKG: Independently reviewed.  Sinus tachycardia with rate 103; nonspecific ST changes with no evidence of acute ischemia   Labs on Admission: I have personally reviewed the available labs and imaging studies at the time of the admission.  Pertinent labs:   Glucose 148 BNP 1194.7 HS troponin 55, 56 Unremarkable CBC COVID/flu negative   Assessment/Plan Principal Problem:   Acute CHF (congestive heart failure) (HCC) Active Problems:   Essential hypertension   Diabetes mellitus type 2 in obese (HCC)   Class 1 obesity due to excess calories with body mass index (BMI) of 32.0 to 32.9 in adult   Acute/new onset CHF -Patient without known h/o CHF presenting with worsening SOB, edema, and orthopnea -CXR consistent with mild vascular congestion but no frank edema; on CT has does have large pulmonary effusions which are likely due to CHF -Elevated BNP without known baseline -With elevated BNP and abnl CXR, acute decompensated CHF seems probable as diagnosis -Will admit, as per the Emergency HF Mortality Risk Grade.  The patient has: large effusions that may require intervention. -Will request echocardiogram -ACE/ARB, beta blocker, and spironolactone are recommended as per guideline-directed medical therapy to reduce morbidity/mortality; he is already on Benicar and this is continued but will likely need BB and spironolactone added when appropriate -CHF order set utilized -Cardiology consulted -Was given Lasix 40 mg x 1 in ER and will repeat with  40 mg IV BID -Continue [place] O2 for now as needed -Mildly elevated HS troponin is likely related to demand ischemia; doubt ACS based on symptoms -We discussed IR vs. Pulm consultation for thoracentesis but he appears to be improved at this time and stable so will hold for now -Will check lipids  HTN -Takes olmesartan monotherapy at home -Will also add prn hydralazine  DM -Will check A1c -Hold glucophage -Will cover with moderate-scale SSI for now  Abnormal CT -At the conclusion of the visit, the patient brought up a prior abnormal CT A/P -This was done in 08/2019 and was lost to f/u due to the COVID pandemic -CT had a number  of findings which appear to need f/u including:  1. Prostatic enlargement and loss of fat plane between the bladder and prostate; PSA recommended.  2. Irregular opacity along the greater curvature of the stomach, ?mass, suggest EGD or UGI  3. 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 Splenic promninence  5. Small pericardial effusion -For now, will add PSA on to blood work -If primary issue is CHF as anticipated, would defer further evaluation of above to PCP since he is not complaining of GI/GU symptoms -On the other hand, if cardiac issues stabilize and/or the echo is unremarkable, repeat CT A/P appears to be in order to r/o GI/GU process causing his symptoms.  Obesity -Body mass index is 32.56 kg/m..  -Weight loss should be encouraged -Outpatient PCP/bariatric medicine f/u encouraged      Note: This patient has been tested and is negative for the novel coronavirus COVID-19. He has been fully vaccinated against COVID-19 with a single dose of J&J.   DVT prophylaxis: Lovenox  Code Status:  DNR - confirmed with patient/family Family Communication: Sister was present throughout evaluation Disposition Plan:  The patient is from: home  Anticipated d/c is to: home, possibly with [facility] services  Anticipated d/c date will depend on clinical response to  treatment, likely 2-4 days  Patient is currently: acutely ill Consults called: Cardiology; [facility] team; PT/OT; Nutrition; Heart Failure Navigator Admission status: Admit - It is my clinical opinion that admission to INPATIENT is reasonable and necessary because this patient will require at least 2 midnights in the hospital to treat this condition based on the medical complexity of the problems presented.  Given the aforementioned information, the predictability of an adverse outcome is felt to be significant.      Jonah Blue MD [facility]   How to contact the [facility] Attending or Consulting provider 7A - 7P or covering provider during after hours 7P -7A, for this patient?  Check the care team in [facility] and look for a) attending/consulting [facility] provider listed and b) the [facility] team listed Log into [email or web address] and use [place]'s universal password to access. If you do not have the password, please contact the hospital operator. Locate the [facility] [employer] provider you are looking for under [facility] and page to a number that you can be directly reached. If you still have difficulty reaching the provider, please page the [facility] (Director on Call) for the Hospitalists listed on amion for assistance.   09/27/2021, 2:05 PM

## 2021-09-27 NOTE — Progress Notes (Signed)
Mobility Specialist: Progress Note   09/27/21 1753  Mobility  Activity Ambulated in hall  Level of Assistance Independent  Distance Ambulated (ft) 740 ft  Mobility Ambulated independently in hallway  Mobility Response Tolerated well  Mobility performed by Mobility specialist  $Mobility charge 1 Mobility   Pre-Mobility: 106 HR Post-Mobility: 108 HR, 96% SpO2  Pt in BR upon entering room, agreeable to ambulation. Pt c/o feeling SOB during ambulation, coached through purse lipped breathing. Pt back to bed after walk with call bell and phone at his side. Family present in the room.   [facility] Hadasa Gasner Mobility Specialist Mobility Specialist Phone #1: [phone] Mobility Specialist Phone #2: [phone]

## 2021-09-27 NOTE — ED Notes (Addendum)
PT states abnormal breathing. RN notified and trending set of vitals charted. Pts VS are WDL at this time and have been so.

## 2021-09-27 NOTE — Progress Notes (Signed)
  Echocardiogram 2D Echocardiogram has been performed.  Delcie Roch 09/27/2021, 2:43 PM

## 2021-09-28 ENCOUNTER — Other Ambulatory Visit (HOSPITAL_COMMUNITY): Payer: Self-pay

## 2021-09-28 LAB — BASIC METABOLIC PANEL
Anion gap: 6 (ref 5–15)
BUN: 19 mg/dL (ref 6–20)
CO2: 25 mmol/L (ref 22–32)
Calcium: 9.1 mg/dL (ref 8.9–10.3)
Chloride: 104 mmol/L (ref 98–111)
Creatinine, Ser: 1.37 mg/dL — ABNORMAL HIGH (ref 0.61–1.24)
GFR, Estimated: >60 mL/min (ref >60)
Glucose, Bld: 124 mg/dL — ABNORMAL HIGH (ref 70–99)
Potassium: 4.5 mmol/L (ref 3.5–5.1)
Sodium: 135 mmol/L (ref 135–145)

## 2021-09-28 LAB — GLUCOSE, CAPILLARY
Glucose-Capillary: 196 mg/dL — ABNORMAL HIGH (ref 70–99)
Glucose-Capillary: 174 mg/dL — ABNORMAL HIGH (ref 70–99)

## 2021-09-28 LAB — CBC
HCT: 42.3 % (ref 39.0–52.0)
Hemoglobin: 14.1 g/dL (ref 13.0–17.0)
MCH: 29.7 pg (ref 26.0–34.0)
MCHC: 33.3 g/dL (ref 30.0–36.0)
MCV: 89.2 fL (ref 80.0–100.0)
Platelets: 229 10*3/uL (ref 150–400)
RBC: 4.74 MIL/uL (ref 4.22–5.81)
RDW: 13.4 % (ref 11.5–15.5)
WBC: 6.4 10*3/uL (ref 4.0–10.5)
nRBC: 0 % (ref 0.0–0.2)

## 2021-09-28 LAB — FPSA% REFLEX
% FREE PSA: 25 %
PSA, FREE: 1.1 ng/mL

## 2021-09-28 LAB — PSA (REFLEX TO FREE) (SERIAL): Prostate Specific Ag, Serum: 4.4 ng/mL — ABNORMAL HIGH (ref 0.0–4.0)

## 2021-09-28 MED ORDER — SODIUM CHLORIDE 0.9 % IV SOLN: 250.0000 mL | INTRAVENOUS | Status: DC | PRN

## 2021-09-28 MED ORDER — METOLAZONE 5 MG PO TABS
2.5000 mg | ORAL_TABLET | Freq: Once | ORAL | Status: AC
  Administered 2021-09-28: 2.5 mg via ORAL
  Filled 2021-09-28: qty 1

## 2021-09-28 MED ORDER — SODIUM CHLORIDE 0.9 % IV SOLN: INTRAVENOUS | Status: DC

## 2021-09-28 MED ORDER — POTASSIUM CHLORIDE CRYS ER 20 MEQ PO TBCR
40.0000 meq | EXTENDED_RELEASE_TABLET | Freq: Once | ORAL | Status: AC
  Administered 2021-09-28: 40 meq via ORAL
  Filled 2021-09-28: qty 2

## 2021-09-28 MED ORDER — SPIRONOLACTONE 12.5 MG HALF TABLET
12.5000 mg | ORAL_TABLET | Freq: Every day | ORAL | Status: DC
  Administered 2021-09-28 – 2021-09-30 (×3): 12.5 mg via ORAL
  Filled 2021-09-28 (×4): qty 1

## 2021-09-28 MED ORDER — INSULIN ASPART 100 UNIT/ML IJ SOLN
0.0000 [IU] | Freq: Three times a day (TID) | INTRAMUSCULAR | Status: DC
  Administered 2021-09-28: 17:00:00 2 [IU] via SUBCUTANEOUS
  Administered 2021-09-29: 1 [IU] via SUBCUTANEOUS
  Administered 2021-09-29: 2 [IU] via SUBCUTANEOUS
  Administered 2021-09-30: 1 [IU] via SUBCUTANEOUS
  Administered 2021-09-30: 5 [IU] via SUBCUTANEOUS

## 2021-09-28 MED ORDER — ASPIRIN 81 MG PO CHEW
81.0000 mg | CHEWABLE_TABLET | ORAL | Status: AC
  Administered 2021-09-29: 81 mg via ORAL
  Filled 2021-09-28: qty 1

## 2021-09-28 MED ORDER — SODIUM CHLORIDE 0.9% FLUSH
3.0000 mL | Freq: Two times a day (BID) | INTRAVENOUS | Status: DC
  Administered 2021-09-29: 3 mL via INTRAVENOUS

## 2021-09-28 MED ORDER — INSULIN ASPART 100 UNIT/ML IJ SOLN: 0.0000 [IU] | Freq: Every day | INTRAMUSCULAR | Status: DC

## 2021-09-28 MED ORDER — SODIUM CHLORIDE 0.9% FLUSH: 3.0000 mL | INTRAVENOUS | Status: DC | PRN

## 2021-09-28 NOTE — TOC Benefit Eligibility Note (Signed)
Patient Product/process development scientist completed.    The patient is currently admitted and upon discharge could be taking Entresto 24-26 mg.  The current 30 day co-pay is, $40.00.   The patient is currently admitted and upon discharge could be taking Farxiga 10 mg.  The current 30 day co-pay is, $40.00.   The patient is currently admitted and upon discharge could be taking Jardiance 10 mg.  The current 30 day co-pay is, $40.00.   The patient is insured through [employer] [facility]     Roland Earl, CPhT Pharmacy Patient Advocate Specialist [facility] Patient Advocate Team Direct Number: [phone]  Fax: [phone]

## 2021-09-28 NOTE — Progress Notes (Signed)
Mobility Specialist: Progress Note   09/28/21 1214  Mobility  Activity Ambulated in hall  Level of Assistance Independent  Assistive Device None  Distance Ambulated (ft) 470 ft  Mobility Ambulated independently in hallway  Mobility Response Tolerated well  Mobility performed by Mobility specialist  $Mobility charge 1 Mobility   Pre-Mobility: 100 HR, 97% SpO2 Post-Mobility: 110 HR  Pt c/o neuropathy pain in his feet during ambulation, otherwise no other c/o. Pt back to bed after walk with call bell and phone in reach. Family present in the room.   [facility] Lennyn Bellanca Mobility Specialist Mobility Specialist Phone #1: [phone] Mobility Specialist Phone #2: [phone]

## 2021-09-28 NOTE — Progress Notes (Signed)
PROGRESS NOTE    [name]  WGN:[id number] DOB: October 28, 1960 DOA: 09/26/2021 PCP: [name], MD   Brief Narrative:  HPI: Brett Walker is a 60 y.o. male with medical history significant of DM; HTN; and HLD presenting with SOB. For the last 1-2 weeks he has had tightness in his chest, difficulty catching his breath.  Over the weekend, his feet and calves were swelling and he was unable to sleep.  He went to his PCP Tuesday afternoon and a blood test was abnormal (D-dimer) and they called and told him to go to the ER.  He feels ok now.  He is mildly SOB, no chest tightness.  +orthopnea x 2 weeks.  He was getting injections in his eyes and was told to sleep at an angle.  He tried sleeping flat and it didn't work at all.  +PND.  No personal history of CAD or CHF.  Assessment & Plan:   Principal Problem:   Acute CHF (congestive heart failure) (HCC) Active Problems:   Essential hypertension   Diabetes mellitus type 2 in obese (HCC)   Class 1 obesity due to excess calories with body mass index (BMI) of 32.0 to 32.9 in adult  Acute/new onset systolic CHF:CXR consistent with mild vascular congestion but no frank edema; on CT has does have large pulmonary effusions which are likely due to CHF. Elevated BNP without known baseline.  Echo shows newly diagnosed systolic congestive heart failure with ejection fraction of 30 to 35% and global hypokinesis and grade 2 diastolic dysfunction.  Cardiology/heart failure team consulted.  They have started him on Lasix 80 mg IV twice daily, he is net -5.5 L since admission.  Plan for right and left heart cath once optimized.  Has been started on [facility] appreciate cardiology help and defer further management to them.   HTN: Takes olmesartan monotherapy at home.  Has been started on Entresto here.  Blood pressure controlled.   DM type II: Hemoglobin A1c 6.8.  Continue to hold Glucophage, not on SSI, will start on SSI.  Hyperlipidemia: LDL 136.  He is  allergic to statins.  Will defer to cardiology on this.   Abnormal CT -At the conclusion of the visit, the patient brought up a prior abnormal CT A/P -This was done in 08/2019 and was lost to f/u due to the COVID pandemic -CT had a number of findings which appear to need f/u including:             1. Prostatic enlargement and loss of fat plane between the bladder and prostate; PSA recommended.             2. Irregular opacity along the greater curvature of the stomach, ?mass, suggest EGD or UGI             3. 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 Splenic promninence             5. Small pericardial effusion -For now, PSA is ordered and in process. -If primary issue is CHF as anticipated, would defer further evaluation of above to PCP since he is not complaining of GI/GU symptoms   Obesity -Body mass index is 32.56 kg/m..  -Weight loss should be encouraged -Outpatient PCP/bariatric medicine f/u encouraged    DVT prophylaxis: Place [name] Start: 09/27/21 1705 enoxaparin (LOVENOX) injection 40 mg Start: 09/27/21 1500   Code Status: Full Code  Family Communication:  None  present at bedside.  Plan of care discussed with patient in length and he verbalized understanding and agreed with it.  Status is: Inpatient  Estimated body mass index is 31.03 kg/m as calculated from the following:   Height as of this encounter: 5\' 9"  (1.753 m).   Weight as of this encounter: 95.3 kg.  Nutritional Assessment: Body mass index is 31.03 kg/m. Seen by dietician.  I agree with the assessment and plan as outlined below: Nutrition Status:   Skin Assessment: I have examined the patient's skin and I agree with the wound assessment as performed by the wound care RN as outlined below:    Consultants:  Cardiology  Procedures:  None  Antimicrobials:  Anti-infectives (From admission, onward)    None          Subjective: Seen and examined.  He has no complaints.  No  shortness of breath.  Objective: Vitals:   09/27/21 1955 09/27/21 2334 09/28/21 0348 09/28/21 0746  BP: (!) 142/82 135/89 (!) 136/91 126/79  Pulse: 91 88 89 93  Resp: 20 15 16 17   Temp: 99 F (37.2 C)  (!) 96.6 F (35.9 C) (!) 97 F (36.1 C)  TempSrc: Oral  Oral Oral  SpO2: 95% 98% 97% 97%  Weight:   95.3 kg   Height:        Intake/Output Summary (Last 24 hours) at 09/28/2021 1054 Last data filed at 09/28/2021 0926 Gross per 24 hour  Intake 480 ml  Output 3275 ml  Net -2795 ml   Filed Weights   09/26/21 2030 09/27/21 1100 09/28/21 0348  Weight: 105.2 kg 100 kg 95.3 kg    Examination:  General exam: Appears calm and comfortable  Respiratory system: Clear to auscultation. Respiratory effort normal. Cardiovascular system: S1 & S2 heard, RRR. No JVD, murmurs, rubs, gallops or clicks.  +1 pitting edema bilateral lower extremity. Gastrointestinal system: Abdomen is nondistended, soft and nontender. No organomegaly or masses felt. Normal bowel sounds heard. Central nervous system: Alert and oriented. No focal neurological deficits. Extremities: Symmetric 5 x 5 power. Skin: No rashes, lesions or ulcers Psychiatry: Judgement and insight appear normal. Mood & affect appropriate.    Data Reviewed: I have personally reviewed following labs and imaging studies  CBC: Recent Labs  Lab 09/26/21 2216 09/28/21 0146  WBC 5.8 6.4  NEUTROABS 3.8  --   HGB 12.6* 14.1  HCT 36.7* 42.3  MCV 89.3 89.2  PLT 214 229   Basic Metabolic Panel: Recent Labs  Lab 09/26/21 2216 09/28/21 0146  NA 138 135  K 4.7 4.5  CL 111 104  CO2 21* 25  GLUCOSE 148* 124*  BUN 23* 19  CREATININE 1.15 1.37*  CALCIUM 8.5* 9.1   GFR: Estimated Creatinine Clearance: 67.7 mL/min (A) (by C-G formula based on SCr of 1.37 mg/dL (H)). Liver Function Tests: Recent Labs  Lab 09/26/21 2216  AST 16  ALT 21  ALKPHOS 56  BILITOT 0.5  PROT 6.0*  ALBUMIN 3.3*   No results for input(s): LIPASE, AMYLASE  in the last 168 hours. No results for input(s): AMMONIA in the last 168 hours. Coagulation Profile: No results for input(s): INR, PROTIME in the last 168 hours. Cardiac Enzymes: No results for input(s): CKTOTAL, CKMB, CKMBINDEX, TROPONINI in the last 168 hours. BNP (last 3 results) No results for input(s): PROBNP in the last 8760 hours. HbA1C: Recent Labs    09/27/21 1411  HGBA1C 6.8*   CBG: Recent Labs  Lab 09/27/21 1148  GLUCAP 145*   Lipid Profile: Recent Labs    09/27/21 1411  CHOL 195  HDL 51  LDLCALC 136*  TRIG 38  CHOLHDL 3.8   Thyroid Function Tests: No results for input(s): TSH, T4TOTAL, FREET4, T3FREE, THYROIDAB in the last 72 hours. Anemia Panel: No results for input(s): VITAMINB12, FOLATE, FERRITIN, TIBC, IRON, RETICCTPCT in the last 72 hours. Sepsis Labs: No results for input(s): PROCALCITON, LATICACIDVEN in the last 168 hours.  Recent Results (from the past 240 hour(s))  Resp Panel by RT-PCR (Flu A&B, Covid) Nasopharyngeal Swab     Status: None   Collection Time: 09/26/21 11:35 PM   Specimen: Nasopharyngeal Swab; Nasopharyngeal(NP) swabs in vial transport medium  Result Value Ref Range Status   SARS Coronavirus 2 by RT PCR NEGATIVE NEGATIVE Final    Comment: (NOTE) SARS-CoV-2 target nucleic acids are NOT DETECTED.  The SARS-CoV-2 RNA is generally detectable in upper respiratory specimens during the acute phase of infection. The lowest concentration of SARS-CoV-2 viral copies this assay can detect is 138 copies/mL. A negative result does not preclude SARS-Cov-2 infection and should not be used as the sole basis for treatment or other patient management decisions. A negative result may occur with  improper specimen collection/handling, submission of specimen other than nasopharyngeal swab, presence of viral mutation(s) within the areas targeted by this assay, and inadequate number of viral copies(<138 copies/mL). A negative result must be combined  with clinical observations, patient history, and epidemiological information. The expected result is Negative.  Fact Sheet for Patients:  BloggerCourse.com  Fact Sheet for Healthcare Providers:  SeriousBroker.it  This test is no t yet approved or cleared by the [place] FDA and  has been authorized for detection and/or diagnosis of SARS-CoV-2 by FDA under an Emergency Use Authorization (EUA). This EUA will remain  in effect (meaning this test can be used) for the duration of the COVID-19 declaration under Section 564(b)(1) of the Act, 21 U.S.C.section 360bbb-3(b)(1), unless the authorization is terminated  or revoked sooner.       Influenza A by PCR NEGATIVE NEGATIVE Final   Influenza B by PCR NEGATIVE NEGATIVE Final    Comment: (NOTE) The Xpert Xpress SARS-CoV-2/FLU/RSV plus assay is intended as an aid in the diagnosis of influenza from Nasopharyngeal swab specimens and should not be used as a sole basis for treatment. Nasal washings and aspirates are unacceptable for Xpert Xpress SARS-CoV-2/FLU/RSV testing.  Fact Sheet for Patients: BloggerCourse.com  Fact Sheet for Healthcare Providers: SeriousBroker.it  This test is not yet approved or cleared by the [place] FDA and has been authorized for detection and/or diagnosis of SARS-CoV-2 by FDA under an Emergency Use Authorization (EUA). This EUA will remain in effect (meaning this test can be used) for the duration of the COVID-19 declaration under Section 564(b)(1) of the Act, 21 U.S.C. section 360bbb-3(b)(1), unless the authorization is terminated or revoked.  Performed at [facility], [address], [place], [place] [address]       Radiology Studies: DG Chest 2 View  Result Date: 09/26/2021 CLINICAL DATA:  Shortness of breath. EXAM: CHEST - 2 VIEW COMPARISON:  None. FINDINGS: Small bilateral  pleural effusions and bibasilar atelectasis or infiltrate. There is mild cardiomegaly with mild vascular congestion. No pneumothorax. No acute osseous pathology. Degenerative changes of spine. IMPRESSION: 1. Small bilateral pleural effusions and bibasilar atelectasis or infiltrate. 2. Mild cardiomegaly with mild vascular congestion. Electronically Signed   By: Elgie Collard M.D.   On: 09/26/2021  22:24   CT Angio Chest PE W/Cm &/Or Wo Cm  Result Date: 09/26/2021 CLINICAL DATA:  Lower extremity pain and swelling for 2 weeks with shortness of breath, initial encounter EXAM: CT ANGIOGRAPHY CHEST WITH CONTRAST TECHNIQUE: Multidetector CT imaging of the chest was performed using the standard protocol during bolus administration of intravenous contrast. Multiplanar CT image reconstructions and MIPs were obtained to evaluate the vascular anatomy. CONTRAST:  84mL OMNIPAQUE IOHEXOL 350 MG/ML SOLN COMPARISON:  Chest x-ray from earlier in the same day. FINDINGS: Cardiovascular: Thoracic aorta shows a normal enhancement pattern. No aneurysmal dilatation or dissection is noted. Coronary calcifications are seen. No cardiac enlargement is noted. Pulmonary artery shows a normal branching pattern without intraluminal filling defect to suggest pulmonary embolism. Mediastinum/Nodes: Thoracic inlet is within normal limits. The esophagus as visualized is within normal limits. No sizable hilar or mediastinal adenopathy is noted. Lungs/Pleura: Large bilateral pleural effusions are noted. Lower lobe atelectatic changes are noted in a compensatory fashion. No focal confluent infiltrate is seen. No sizable parenchymal nodule is noted. Upper Abdomen: No acute abnormality. Musculoskeletal: Degenerative changes of the thoracic spine are noted. No acute rib abnormality is noted. Review of the MIP images confirms the above findings. IMPRESSION: No evidence pulmonary embolism. Large bilateral pleural effusions with compensatory lower lobe  atelectatic changes. Electronically Signed   By: Alcide Clever M.D.   On: 09/26/2021 23:23   US Venous Img Lower Bilateral  Result Date: 09/26/2021 CLINICAL DATA:  Shortness of breath. EXAM: BILATERAL LOWER EXTREMITY VENOUS DOPPLER ULTRASOUND TECHNIQUE: Gray-scale sonography with compression, as well as color and duplex ultrasound, were performed to evaluate the deep venous system(s) from the level of the common femoral vein through the popliteal and proximal calf veins. COMPARISON:  None. FINDINGS: VENOUS Normal compressibility of the common femoral, superficial femoral, and popliteal veins, as well as the visualized calf veins. Visualized portions of profunda femoral vein and great saphenous vein unremarkable. No filling defects to suggest DVT on grayscale or color Doppler imaging. Doppler waveforms show normal direction of venous flow, normal respiratory plasticity and response to augmentation. OTHER None. Limitations: none IMPRESSION: Negative. Electronically Signed   By: Darliss Cheney M.D.   On: 09/26/2021 23:14   ECHOCARDIOGRAM COMPLETE  Result Date: 09/27/2021    ECHOCARDIOGRAM REPORT   Patient Name:   Brett Walker Date of Exam: 09/27/2021 Medical Rec #:  [id number]         Height:       69.0 in Accession #:    [id number]        Weight:       220.5 lb Date of Birth:  06-16-1961        BSA:          2.153 m Patient Age:    59 years          BP:           158/95 mmHg Patient Gender: M                 HR:           95 bpm. Exam Location:  Inpatient Procedure: 2D Echo Indications:    congestive heart failure  History:        Patient has no prior history of Echocardiogram examinations.                 CHF; Risk Factors:Hypertension and Diabetes.  Sonographer:    Delcie Roch RDCS Referring Phys: [id number] JENNIFER YATES IMPRESSIONS  1. Left ventricular ejection fraction, by estimation, is 30 to 35%. The left ventricle has moderately decreased function. The left ventricle demonstrates global hypokinesis.  There is mild left ventricular hypertrophy. Left ventricular diastolic parameters are consistent with Grade II diastolic dysfunction (pseudonormalization). Elevated left atrial pressure.  2. Right ventricular systolic function is normal. The right ventricular size is normal.  3. Left atrial size was mildly dilated.  4. A small pericardial effusion is present.  5. The mitral valve is normal in structure. Mild mitral valve regurgitation. No evidence of mitral stenosis.  6. The aortic valve is tricuspid. Aortic valve regurgitation is not visualized. Aortic valve sclerosis is present, with no evidence of aortic valve stenosis.  7. The inferior vena cava is dilated in size with >50% respiratory variability, suggesting right atrial pressure of 8 mmHg. Comparison(s): No prior Echocardiogram. FINDINGS  Left Ventricle: Left ventricular ejection fraction, by estimation, is 30 to 35%. The left ventricle has moderately decreased function. The left ventricle demonstrates global hypokinesis. The left ventricular internal cavity size was normal in size. There is mild left ventricular hypertrophy. Left ventricular diastolic parameters are consistent with Grade II diastolic dysfunction (pseudonormalization). Elevated left atrial pressure. Right Ventricle: The right ventricular size is normal. Right ventricular systolic function is normal. Left Atrium: Left atrial size was mildly dilated. Right Atrium: Right atrial size was normal in size. Pericardium: A small pericardial effusion is present. Mitral Valve: The mitral valve is normal in structure. Mild mitral valve regurgitation. No evidence of mitral valve stenosis. Tricuspid Valve: The tricuspid valve is normal in structure. Tricuspid valve regurgitation is trivial. No evidence of tricuspid stenosis. Aortic Valve: The aortic valve is tricuspid. Aortic valve regurgitation is not visualized. Aortic valve sclerosis is present, with no evidence of aortic valve stenosis. Pulmonic Valve:  The pulmonic valve was normal in structure. Pulmonic valve regurgitation is trivial. No evidence of pulmonic stenosis. Aorta: The aortic root is normal in size and structure. Venous: The inferior vena cava is dilated in size with greater than 50% respiratory variability, suggesting right atrial pressure of 8 mmHg. IAS/Shunts: No atrial level shunt detected by color flow Doppler.  LEFT VENTRICLE PLAX 2D LVIDd:         4.90 cm      Diastology LVIDs:         4.10 cm      LV e' medial:    3.70 cm/s LV PW:         1.30 cm      LV E/e' medial:  30.0 LV IVS:        1.10 cm      LV e' lateral:   10.60 cm/s LVOT diam:     2.10 cm      LV E/e' lateral: 10.5 LV SV:         48 LV SV Index:   22 LVOT Area:     3.46 cm  LV Volumes (MOD) LV vol d, MOD A4C: 116.0 ml LV vol s, MOD A4C: 79.1 ml LV SV MOD A4C:     116.0 ml RIGHT VENTRICLE             IVC RV S prime:     11.90 cm/s  IVC diam: 2.30 cm TAPSE (M-mode): 1.7 cm LEFT ATRIUM             Index        RIGHT ATRIUM           Index LA diam:  4.90 cm 2.28 cm/m   RA Area:     15.30 cm LA Vol (A2C):   75.9 ml 35.25 ml/m  RA Volume:   43.90 ml  20.39 ml/m LA Vol (A4C):   89.2 ml 41.42 ml/m LA Biplane Vol: 87.6 ml 40.68 ml/m  AORTIC VALVE LVOT Vmax:   71.80 cm/s LVOT Vmean:  50.400 cm/s LVOT VTI:    0.138 m  AORTA Ao Root diam: 3.20 cm Ao Asc diam:  3.40 cm MITRAL VALVE MV Area (PHT): 5.27 cm     SHUNTS MV Decel Time: 144 msec     Systemic VTI:  0.14 m MV E velocity: 111.00 cm/s  Systemic Diam: 2.10 cm MV A velocity: 66.80 cm/s MV E/A ratio:  1.66 Olga Millers MD Electronically signed by Olga Millers MD Signature Date/Time: 09/27/2021/2:51:07 PM    Final     Scheduled Meds:  docusate sodium  100 mg Oral BID   enoxaparin (LOVENOX) injection  40 mg Subcutaneous Q24H   furosemide  80 mg Intravenous BID   gabapentin  300 mg Oral QHS   sacubitril-valsartan  1 tablet Oral BID   sodium chloride flush  3 mL Intravenous Q12H   Continuous Infusions:   LOS: 1 day    Time spent: 35 minutes   Hughie Closs, MD [facility]  09/28/2021, 10:54 AM  Please page via Loretha Stapler and do not message via secure chat for anything urgent. Secure chat can be used for anything non urgent.  How to contact the [facility] Attending or Consulting provider 7A - 7P or covering provider during after hours 7P -7A, for this patient?  Check the care team in [facility] and look for a) attending/consulting [facility] provider listed and b) the [facility] team listed. Page or secure chat 7A-7P. Log into [email or web address] and use [facility] universal password to access. If you do not have the password, please contact the hospital operator. Locate the [facility] provider you are looking for under [facility] and page to a number that you can be directly reached. If you still have difficulty reaching the provider, please page the [facility] (Director on Call) for the Hospitalists listed on amion for assistance.

## 2021-09-28 NOTE — Progress Notes (Signed)
OT Cancellation Note  Patient Details Name: Brett Walker MRN: [id number] DOB: 1961-03-28   Cancelled Treatment:    Reason Eval/Treat Not Completed: OT screened, no needs identified, will sign off  Evern Bio 09/28/2021, 8:03 AM Martie Round, OTR/L Acute Rehabilitation Services Pager: [phone] Office: [phone]

## 2021-09-28 NOTE — Progress Notes (Addendum)
Advanced Heart Failure Rounding Note  PCP-Cardiologist: None   Subjective:    Dyspnea significantly improved. Ambulated halls. No CP  Weight down 10 lb with IV lasix. Is/Os not accurate.  Scr 1.15>1.37  K 4.5  BP remains elevated but improving   Objective:   Weight Range: 95.3 kg Body mass index is 31.03 kg/m.   Vital Signs:   Temp:  [96.6 F (35.9 C)-99 F (37.2 C)] 97 F (36.1 C) (12/08 0746) Pulse Rate:  [88-94] 93 (12/08 0746) Resp:  [15-20] 17 (12/08 0746) BP: (126-147)/(79-98) 126/79 (12/08 0746) SpO2:  [95 %-98 %] 97 % (12/08 0746) Weight:  [95.3 kg] 95.3 kg (12/08 0348) Last BM Date: 09/28/21  Weight change: Filed Weights   09/26/21 2030 09/27/21 1100 09/28/21 0348  Weight: 105.2 kg 100 kg 95.3 kg    Intake/Output:   Intake/Output Summary (Last 24 hours) at 09/28/2021 1312 Last data filed at 09/28/2021 1000 Gross per 24 hour  Intake 480 ml  Output 3450 ml  Net -2970 ml      Physical Exam    General:  No distress. Sitting up in bed HEENT: Normal Neck: Supple. JVP 10-12 cm. Carotids 2+ bilat; no bruits. No lymphadenopathy or thyromegaly appreciated. Cor: PMI nondisplaced. Regular rate & rhythm. No rubs, gallops or murmurs. Lungs: Crackles in bases Abdomen: Soft, nontender, nondistended. No hepatosplenomegaly. No bruits or masses. Good bowel sounds. Extremities: No cyanosis, clubbing, rash, 1-2 + edema Neuro: Alert & orientedx3, cranial nerves grossly intact. moves all 4 extremities w/o difficulty. Affect pleasant   Telemetry   SR 90s (personally reviewed)   Labs    CBC Recent Labs    09/26/21 2216 09/28/21 0146  WBC 5.8 6.4  NEUTROABS 3.8  --   HGB 12.6* 14.1  HCT 36.7* 42.3  MCV 89.3 89.2  PLT 214 229   Basic Metabolic Panel Recent Labs    15/17/61 2216 09/28/21 0146  NA 138 135  K 4.7 4.5  CL 111 104  CO2 21* 25  GLUCOSE 148* 124*  BUN 23* 19  CREATININE 1.15 1.37*  CALCIUM 8.5* 9.1   Liver Function  Tests Recent Labs    09/26/21 2216  AST 16  ALT 21  ALKPHOS 56  BILITOT 0.5  PROT 6.0*  ALBUMIN 3.3*   No results for input(s): LIPASE, AMYLASE in the last 72 hours. Cardiac Enzymes No results for input(s): CKTOTAL, CKMB, CKMBINDEX, TROPONINI in the last 72 hours.  BNP: BNP (last 3 results) Recent Labs    09/26/21 2216  BNP 1,194.7*    ProBNP (last 3 results) No results for input(s): PROBNP in the last 8760 hours.   D-Dimer No results for input(s): DDIMER in the last 72 hours. Hemoglobin A1C Recent Labs    09/27/21 1411  HGBA1C 6.8*   Fasting Lipid Panel Recent Labs    09/27/21 1411  CHOL 195  HDL 51  LDLCALC 136*  TRIG 38  CHOLHDL 3.8   Thyroid Function Tests No results for input(s): TSH, T4TOTAL, T3FREE, THYROIDAB in the last 72 hours.  Invalid input(s): FREET3  Other results:   Imaging    ECHOCARDIOGRAM COMPLETE  Result Date: 09/27/2021    ECHOCARDIOGRAM REPORT   Patient Name:   Brett Walker Date of Exam: 09/27/2021 Medical Rec #:  [id number]         Height:       69.0 in Accession #:    [id number]        Weight:  220.5 lb Date of Birth:  06-May-1961        BSA:          2.153 m Patient Age:    59 years          BP:           158/95 mmHg Patient Gender: M                 HR:           95 bpm. Exam Location:  Inpatient Procedure: 2D Echo Indications:    congestive heart failure  History:        Patient has no prior history of Echocardiogram examinations.                 CHF; Risk Factors:Hypertension and Diabetes.  Sonographer:    Delcie Roch RDCS Referring Phys: [id number] JENNIFER YATES IMPRESSIONS  1. Left ventricular ejection fraction, by estimation, is 30 to 35%. The left ventricle has moderately decreased function. The left ventricle demonstrates global hypokinesis. There is mild left ventricular hypertrophy. Left ventricular diastolic parameters are consistent with Grade II diastolic dysfunction (pseudonormalization). Elevated left atrial  pressure.  2. Right ventricular systolic function is normal. The right ventricular size is normal.  3. Left atrial size was mildly dilated.  4. A small pericardial effusion is present.  5. The mitral valve is normal in structure. Mild mitral valve regurgitation. No evidence of mitral stenosis.  6. The aortic valve is tricuspid. Aortic valve regurgitation is not visualized. Aortic valve sclerosis is present, with no evidence of aortic valve stenosis.  7. The inferior vena cava is dilated in size with >50% respiratory variability, suggesting right atrial pressure of 8 mmHg. Comparison(s): No prior Echocardiogram. FINDINGS  Left Ventricle: Left ventricular ejection fraction, by estimation, is 30 to 35%. The left ventricle has moderately decreased function. The left ventricle demonstrates global hypokinesis. The left ventricular internal cavity size was normal in size. There is mild left ventricular hypertrophy. Left ventricular diastolic parameters are consistent with Grade II diastolic dysfunction (pseudonormalization). Elevated left atrial pressure. Right Ventricle: The right ventricular size is normal. Right ventricular systolic function is normal. Left Atrium: Left atrial size was mildly dilated. Right Atrium: Right atrial size was normal in size. Pericardium: A small pericardial effusion is present. Mitral Valve: The mitral valve is normal in structure. Mild mitral valve regurgitation. No evidence of mitral valve stenosis. Tricuspid Valve: The tricuspid valve is normal in structure. Tricuspid valve regurgitation is trivial. No evidence of tricuspid stenosis. Aortic Valve: The aortic valve is tricuspid. Aortic valve regurgitation is not visualized. Aortic valve sclerosis is present, with no evidence of aortic valve stenosis. Pulmonic Valve: The pulmonic valve was normal in structure. Pulmonic valve regurgitation is trivial. No evidence of pulmonic stenosis. Aorta: The aortic root is normal in size and structure.  Venous: The inferior vena cava is dilated in size with greater than 50% respiratory variability, suggesting right atrial pressure of 8 mmHg. IAS/Shunts: No atrial level shunt detected by color flow Doppler.  LEFT VENTRICLE PLAX 2D LVIDd:         4.90 cm      Diastology LVIDs:         4.10 cm      LV e' medial:    3.70 cm/s LV PW:         1.30 cm      LV E/e' medial:  30.0 LV IVS:        1.10 cm  LV e' lateral:   10.60 cm/s LVOT diam:     2.10 cm      LV E/e' lateral: 10.5 LV SV:         48 LV SV Index:   22 LVOT Area:     3.46 cm  LV Volumes (MOD) LV vol d, MOD A4C: 116.0 ml LV vol s, MOD A4C: 79.1 ml LV SV MOD A4C:     116.0 ml RIGHT VENTRICLE             IVC RV S prime:     11.90 cm/s  IVC diam: 2.30 cm TAPSE (M-mode): 1.7 cm LEFT ATRIUM             Index        RIGHT ATRIUM           Index LA diam:        4.90 cm 2.28 cm/m   RA Area:     15.30 cm LA Vol (A2C):   75.9 ml 35.25 ml/m  RA Volume:   43.90 ml  20.39 ml/m LA Vol (A4C):   89.2 ml 41.42 ml/m LA Biplane Vol: 87.6 ml 40.68 ml/m  AORTIC VALVE LVOT Vmax:   71.80 cm/s LVOT Vmean:  50.400 cm/s LVOT VTI:    0.138 m  AORTA Ao Root diam: 3.20 cm Ao Asc diam:  3.40 cm MITRAL VALVE MV Area (PHT): 5.27 cm     SHUNTS MV Decel Time: 144 msec     Systemic VTI:  0.14 m MV E velocity: 111.00 cm/s  Systemic Diam: 2.10 cm MV A velocity: 66.80 cm/s MV E/A ratio:  1.66 Olga Millers MD Electronically signed by Olga Millers MD Signature Date/Time: 09/27/2021/2:51:07 PM    Final      Medications:     Scheduled Medications:  docusate sodium  100 mg Oral BID   enoxaparin (LOVENOX) injection  40 mg Subcutaneous Q24H   furosemide  80 mg Intravenous BID   gabapentin  300 mg Oral QHS   insulin aspart  0-5 Units Subcutaneous QHS   insulin aspart  0-9 Units Subcutaneous TID WC   sacubitril-valsartan  1 tablet Oral BID   sodium chloride flush  3 mL Intravenous Q12H    Infusions:   PRN Medications: acetaminophen **OR** acetaminophen, bisacodyl,  hydrALAZINE, morphine injection, ondansetron **OR** ondansetron (ZOFRAN) IV, oxyCODONE, polyethylene glycol    Patient Profile   60 y.o. male with history of HTN, HLD, DM, obesity admitted with acute HF/new CM.    Assessment/Plan  Acute systolic HF: -presenting with 1-2 weeks chest discomfort and dyspnea.  -Echo with EF 30-35%, RV okay, mild MR -Etiology not certain. Previously consumed large amounts of alcohol, minimal intake last few years. No illicit drug use. SR/Sinus tach here, no arrhythmias. -Will need [facility] tomorrow. Coronary calcifications noted on CTA. If no obstructive CAD on LHC, obtain cMRI. -BNP 1,195  -NYHA IIIb. Appears volume overloaded. Diuresing with IV lasix 80 mg BID. Down 10 lb, but still has room to go. Continue IV lasix today + 2.5 mg metolazone. -Hold off on beta blocker for now d/t acute heart failure exacerbation -Entresto 24/26 mg BID -Add spiro 12.5 mg daily -Add SGLT2i this admit. A1c 6.8 - Slight bump in Scr 1.1>1.3. BMET daily -[name] -Reports snoring. Will need sleep study.   2. Sinus tachycardia -Likely d/t HF   3. Bilateral  pleural effusions -Appeared large in size on CTA   4. DM: -On metformin PTA -A1c 6.8 -Management  per TRH   5. HTN: -BP elevated -Medications as above   6. HLD: -Lipid panel pending -With history of DM, would benefit from addition of statin or PCSK-9i depending on LHC -GI upset with atorvastatin and crestor   7. Obesity: -Body mass index is 32.56 kg/m.   7. Hx alcohol abuse -Previously consumed large quantities of alcohol, minimal intake last few years   8. Abnormal imaging -CTA 08/2019 for abdominal pain and weight loss: Enlarged prostate, irrgular opacity greater curvature body of stomach, evidence of residual sclerosing mesenteritis, thickening of several loops of jejunum likely d/t enteritis -Has not had f/u on above findings -TRH working up -PSA pending       Length of Stay: 1  FINCH, LINDSAY  N, PA-C  09/28/2021, 1:12 PM  Advanced Heart Failure Team Pager [phone] (M-F; 7a - 5p)  Please contact [facility] for night-coverage after hours (5p -7a ) and weekends on [email or web address]  Patient seen and examined with the above-signed Advanced Practice Provider and/or Housestaff. I personally reviewed laboratory data, imaging studies and relevant notes. I independently examined the patient and formulated the important aspects of the plan. I have edited the note to reflect any of my changes or salient points. I have personally discussed the plan with the patient and/or family.  Diuresing well. SOB and orthopnea improving. Was able to ambulate hall with no CP or SOB. Renal function and rhythm stable.   General:  Sitting up in bed No resp difficulty HEENT: normal Neck: supple.JVP 8-9 . Carotids 2+ bilat; no bruits. No lymphadenopathy or thryomegaly appreciated. Cor: PMI nondisplaced. Regular rate & rhythm. No rubs, gallops or murmurs. Lungs: clear Abdomen: soft, nontender, nondistended. No hepatosplenomegaly. No bruits or masses. Good bowel sounds. Extremities: no cyanosis, clubbing, rash, 2+ edema Neuro: alert & orientedx3, cranial nerves grossly intact. moves all 4 extremities w/o difficulty. Affect pleasant  Continue IV diuresis. Add metolazone. Plan R/L cath tomorrow +/- cMRI. I am concerned about possible 3v CAD. Place [name]. Plan d/w nursing team.   Arvilla Meres, MD  6:47 PM

## 2021-09-28 NOTE — Progress Notes (Signed)
[name] placed to bilateral legs as ordered. [name], Randall An RN

## 2021-09-29 ENCOUNTER — Encounter (HOSPITAL_COMMUNITY): Payer: Self-pay | Admitting: Internal Medicine

## 2021-09-29 ENCOUNTER — Encounter (HOSPITAL_COMMUNITY)
Admission: EM | Disposition: A | Discharge status: Home / Self Care | Payer: Self-pay | Source: Home / Self Care | Attending: Family Medicine

## 2021-09-29 ENCOUNTER — Inpatient Hospital Stay (HOSPITAL_COMMUNITY): Payer: BC Managed Care – PPO

## 2021-09-29 DIAGNOSIS — I5021 Acute systolic (congestive) heart failure: Secondary | ICD-10-CM

## 2021-09-29 DIAGNOSIS — E119 Type 2 diabetes mellitus without complications: Secondary | ICD-10-CM

## 2021-09-29 DIAGNOSIS — Z6832 Body mass index (BMI) 32.0-32.9, adult: Secondary | ICD-10-CM

## 2021-09-29 DIAGNOSIS — I2510 Atherosclerotic heart disease of native coronary artery without angina pectoris: Secondary | ICD-10-CM

## 2021-09-29 HISTORY — PX: RIGHT/LEFT HEART CATH AND CORONARY ANGIOGRAPHY: CATH118266

## 2021-09-29 LAB — POCT I-STAT 7, (LYTES, BLD GAS, ICA,H+H)
Acid-Base Excess: 2 mmol/L (ref 0.0–2.0)
Acid-base deficit: 4 mmol/L — ABNORMAL HIGH (ref 0.0–2.0)
Acid-base deficit: 2 mmol/L (ref 0.0–2.0)
Bicarbonate: 19.8 mmol/L — ABNORMAL LOW (ref 20.0–28.0)
Bicarbonate: 22.2 mmol/L (ref 20.0–28.0)
Bicarbonate: 26.5 mmol/L (ref 20.0–28.0)
Calcium, Ion: 0.76 mmol/L — CL (ref 1.15–1.40)
Calcium, Ion: 0.9 mmol/L — ABNORMAL LOW (ref 1.15–1.40)
Calcium, Ion: 1.24 mmol/L (ref 1.15–1.40)
HCT: 32 % — ABNORMAL LOW (ref 39.0–52.0)
HCT: 36 % — ABNORMAL LOW (ref 39.0–52.0)
HCT: 42 % (ref 39.0–52.0)
Hemoglobin: 10.9 g/dL — ABNORMAL LOW (ref 13.0–17.0)
Hemoglobin: 12.2 g/dL — ABNORMAL LOW (ref 13.0–17.0)
Hemoglobin: 14.3 g/dL (ref 13.0–17.0)
O2 Saturation: 95 %
O2 Saturation: 71 %
O2 Saturation: 72 %
Potassium: 2.8 mmol/L — ABNORMAL LOW (ref 3.5–5.1)
Potassium: 3.2 mmol/L — ABNORMAL LOW (ref 3.5–5.1)
Potassium: 4 mmol/L (ref 3.5–5.1)
Sodium: 148 mmol/L — ABNORMAL HIGH (ref 135–145)
Sodium: 138 mmol/L (ref 135–145)
Sodium: 145 mmol/L (ref 135–145)
TCO2: 21 mmol/L — ABNORMAL LOW (ref 22–32)
TCO2: 23 mmol/L (ref 22–32)
TCO2: 28 mmol/L (ref 22–32)
pCO2 arterial: 31.9 mmHg — ABNORMAL LOW (ref 32.0–48.0)
pCO2 arterial: 35.5 mmHg (ref 32.0–48.0)
pCO2 arterial: 40.8 mmHg (ref 32.0–48.0)
pH, Arterial: 7.401 (ref 7.350–7.450)
pH, Arterial: 7.404 (ref 7.350–7.450)
pH, Arterial: 7.42 (ref 7.350–7.450)
pO2, Arterial: 76 mmHg — ABNORMAL LOW (ref 83.0–108.0)
pO2, Arterial: 37 mmHg — CL (ref 83.0–108.0)
pO2, Arterial: 37 mmHg — CL (ref 83.0–108.0)

## 2021-09-29 LAB — POCT I-STAT EG7
Acid-base deficit: 3 mmol/L — ABNORMAL HIGH (ref 0.0–2.0)
Bicarbonate: 21.6 mmol/L (ref 20.0–28.0)
Calcium, Ion: 0.84 mmol/L — CL (ref 1.15–1.40)
HCT: 34 % — ABNORMAL LOW (ref 39.0–52.0)
Hemoglobin: 11.6 g/dL — ABNORMAL LOW (ref 13.0–17.0)
O2 Saturation: 75 %
Potassium: 3.1 mmol/L — ABNORMAL LOW (ref 3.5–5.1)
Sodium: 145 mmol/L (ref 135–145)
TCO2: 23 mmol/L (ref 22–32)
pCO2, Ven: 36.2 mmHg — ABNORMAL LOW (ref 44.0–60.0)
pH, Ven: 7.384 (ref 7.250–7.430)
pO2, Ven: 40 mmHg (ref 32.0–45.0)

## 2021-09-29 LAB — BASIC METABOLIC PANEL
Anion gap: 9 (ref 5–15)
BUN: 28 mg/dL — ABNORMAL HIGH (ref 6–20)
CO2: 23 mmol/L (ref 22–32)
Calcium: 8.9 mg/dL (ref 8.9–10.3)
Chloride: 102 mmol/L (ref 98–111)
Creatinine, Ser: 1.38 mg/dL — ABNORMAL HIGH (ref 0.61–1.24)
GFR, Estimated: 60 mL/min — ABNORMAL LOW (ref >60)
Glucose, Bld: 143 mg/dL — ABNORMAL HIGH (ref 70–99)
Potassium: 3.8 mmol/L (ref 3.5–5.1)
Sodium: 134 mmol/L — ABNORMAL LOW (ref 135–145)

## 2021-09-29 LAB — GLUCOSE, CAPILLARY
Glucose-Capillary: 152 mg/dL — ABNORMAL HIGH (ref 70–99)
Glucose-Capillary: 158 mg/dL — ABNORMAL HIGH (ref 70–99)
Glucose-Capillary: 146 mg/dL — ABNORMAL HIGH (ref 70–99)
Glucose-Capillary: 184 mg/dL — ABNORMAL HIGH (ref 70–99)

## 2021-09-29 LAB — MAGNESIUM: Magnesium: 1.9 mg/dL (ref 1.7–2.4)

## 2021-09-29 SURGERY — RIGHT/LEFT HEART CATH AND CORONARY ANGIOGRAPHY
Anesthesia: LOCAL

## 2021-09-29 MED ORDER — SODIUM CHLORIDE 0.9% FLUSH
3.0000 mL | Freq: Two times a day (BID) | INTRAVENOUS | Status: DC
  Administered 2021-09-29 – 2021-09-30 (×2): 3 mL via INTRAVENOUS

## 2021-09-29 MED ORDER — HEPARIN SODIUM (PORCINE) 1000 UNIT/ML IJ SOLN
INTRAMUSCULAR | Status: DC | PRN
  Administered 2021-09-29: 4500 [IU] via INTRAVENOUS

## 2021-09-29 MED ORDER — MIDAZOLAM HCL 2 MG/2ML IJ SOLN
INTRAMUSCULAR | Status: DC | PRN
  Administered 2021-09-29: 2 mg via INTRAVENOUS

## 2021-09-29 MED ORDER — ZOLPIDEM TARTRATE 5 MG PO TABS
10.0000 mg | ORAL_TABLET | Freq: Every evening | ORAL | Status: DC | PRN
  Administered 2021-09-29: 10 mg via ORAL
  Filled 2021-09-29: qty 2

## 2021-09-29 MED ORDER — HYDRALAZINE HCL 20 MG/ML IJ SOLN: 10.0000 mg | INTRAMUSCULAR | Status: AC | PRN

## 2021-09-29 MED ORDER — LIDOCAINE HCL (PF) 1 % IJ SOLN
INTRAMUSCULAR | Status: AC
  Filled 2021-09-29: qty 30

## 2021-09-29 MED ORDER — FENTANYL CITRATE (PF) 100 MCG/2ML IJ SOLN
INTRAMUSCULAR | Status: DC | PRN
  Administered 2021-09-29: 25 ug via INTRAVENOUS

## 2021-09-29 MED ORDER — LIDOCAINE HCL (PF) 1 % IJ SOLN
INTRAMUSCULAR | Status: DC | PRN
  Administered 2021-09-29 (×2): 2 mL

## 2021-09-29 MED ORDER — HEPARIN (PORCINE) IN NACL 1000-0.9 UT/500ML-% IV SOLN
INTRAVENOUS | Status: AC
  Filled 2021-09-29: qty 1000

## 2021-09-29 MED ORDER — VERAPAMIL HCL 2.5 MG/ML IV SOLN
INTRAVENOUS | Status: AC
  Filled 2021-09-29: qty 2

## 2021-09-29 MED ORDER — ONDANSETRON HCL 4 MG/2ML IJ SOLN: 4.0000 mg | Freq: Four times a day (QID) | INTRAMUSCULAR | Status: DC | PRN

## 2021-09-29 MED ORDER — IOHEXOL 350 MG/ML SOLN
INTRAVENOUS | Status: DC | PRN
  Administered 2021-09-29: 70 mL

## 2021-09-29 MED ORDER — HEPARIN SODIUM (PORCINE) 1000 UNIT/ML IJ SOLN
INTRAMUSCULAR | Status: AC
  Filled 2021-09-29: qty 10

## 2021-09-29 MED ORDER — HEPARIN (PORCINE) IN NACL 1000-0.9 UT/500ML-% IV SOLN
INTRAVENOUS | Status: DC | PRN
  Administered 2021-09-29 (×2): 500 mL

## 2021-09-29 MED ORDER — MIDAZOLAM HCL 2 MG/2ML IJ SOLN
INTRAMUSCULAR | Status: AC
  Filled 2021-09-29: qty 2

## 2021-09-29 MED ORDER — VERAPAMIL HCL 2.5 MG/ML IV SOLN
INTRAVENOUS | Status: DC | PRN
  Administered 2021-09-29: 10 mL via INTRA_ARTERIAL

## 2021-09-29 MED ORDER — FENTANYL CITRATE (PF) 100 MCG/2ML IJ SOLN
INTRAMUSCULAR | Status: AC
  Filled 2021-09-29: qty 2

## 2021-09-29 MED ORDER — SODIUM CHLORIDE 0.9 % IV SOLN: INTRAVENOUS | Status: AC

## 2021-09-29 MED ORDER — ENOXAPARIN SODIUM 40 MG/0.4ML IJ SOSY
40.0000 mg | PREFILLED_SYRINGE | INTRAMUSCULAR | Status: DC
  Administered 2021-09-30: 40 mg via SUBCUTANEOUS
  Filled 2021-09-29: qty 0.4

## 2021-09-29 MED ORDER — SODIUM CHLORIDE 0.9% FLUSH: 3.0000 mL | INTRAVENOUS | Status: DC | PRN

## 2021-09-29 MED ORDER — ASPIRIN EC 81 MG PO TBEC
81.0000 mg | DELAYED_RELEASE_TABLET | Freq: Every day | ORAL | Status: DC
  Administered 2021-09-30: 81 mg via ORAL
  Filled 2021-09-29: qty 1

## 2021-09-29 MED ORDER — LABETALOL HCL 5 MG/ML IV SOLN: 10.0000 mg | INTRAVENOUS | Status: AC | PRN

## 2021-09-29 MED ORDER — ACETAMINOPHEN 325 MG PO TABS: 650.0000 mg | ORAL_TABLET | ORAL | Status: DC | PRN

## 2021-09-29 MED ORDER — SODIUM CHLORIDE 0.9 % IV SOLN: 250.0000 mL | INTRAVENOUS | Status: DC | PRN

## 2021-09-29 SURGICAL SUPPLY — 11 items
CATH 5FR JL3.5 JR4 ANG PIG MP (CATHETERS) ×1 IMPLANT
CATH BALLN WEDGE 5F 110CM (CATHETERS) ×2 IMPLANT
DEVICE RAD COMP TR BAND LRG (VASCULAR PRODUCTS) ×1 IMPLANT
GLIDESHEATH SLEND SS 6F .021 (SHEATH) ×1 IMPLANT
GUIDEWIRE .025 260CM (WIRE) ×2 IMPLANT
GUIDEWIRE INQWIRE 1.5J.035X260 (WIRE) IMPLANT
INQWIRE 1.5J .035X260CM (WIRE) ×2
PACK CARDIAC CATHETERIZATION (CUSTOM PROCEDURE TRAY) ×2 IMPLANT
SHEATH GLIDE SLENDER 4/5FR (SHEATH) ×1 IMPLANT
TRANSDUCER W/STOPCOCK (MISCELLANEOUS) ×2 IMPLANT
WIRE EMERALD 3MM-J .025X260CM (WIRE) ×1 IMPLANT

## 2021-09-29 NOTE — Progress Notes (Signed)
Advanced Heart Failure Rounding Note  PCP-Cardiologist: None   Subjective:    Diuresed well overnight with IV lasix and metolazone. Weight down another 7 pounds. (29 pounds total from ER)  Denies SOB, orthopnea or PND. SCr stable at 1.38  K 3.8   Objective:   Weight Range: 92.2 kg Body mass index is 30.02 kg/m.   Vital Signs:   Temp:  [97.5 F (36.4 C)-98.2 F (36.8 C)] 97.5 F (36.4 C) (12/09 0858) Pulse Rate:  [87-98] 89 (12/09 0858) Resp:  [15-20] 15 (12/09 0858) BP: (100-131)/(73-95) 121/84 (12/09 0858) SpO2:  [95 %-98 %] 97 % (12/09 0858) Weight:  [92.2 kg] 92.2 kg (12/09 0447) Last BM Date: 09/28/21  Weight change: Filed Weights   09/27/21 1100 09/28/21 0348 09/29/21 0447  Weight: 100 kg 95.3 kg 92.2 kg    Intake/Output:   Intake/Output Summary (Last 24 hours) at 09/29/2021 1004 Last data filed at 09/29/2021 0859 Gross per 24 hour  Intake 240 ml  Output 3800 ml  Net -3560 ml       Physical Exam    General:  Well appearing. No resp difficulty HEENT: normal Neck: supple. no JVD. Carotids 2+ bilat; no bruits. No lymphadenopathy or thryomegaly appreciated. Cor: PMI nondisplaced. Regular rate & rhythm. No rubs, gallops or murmurs. Lungs: clear Abdomen: soft, nontender, nondistended. No hepatosplenomegaly. No bruits or masses. Good bowel sounds. Extremities: no cyanosis, clubbing, rash, trace edema Neuro: alert & orientedx3, cranial nerves grossly intact. moves all 4 extremities w/o difficulty. Affect pleasant  Telemetry   SR 80-90s Personally reviewed   Labs    CBC Recent Labs    09/26/21 2216 09/28/21 0146  WBC 5.8 6.4  NEUTROABS 3.8  --   HGB 12.6* 14.1  HCT 36.7* 42.3  MCV 89.3 89.2  PLT 214 229    Basic Metabolic Panel Recent Labs    76/22/63 0146 09/29/21 0155  NA 135 134*  K 4.5 3.8  CL 104 102  CO2 25 23  GLUCOSE 124* 143*  BUN 19 28*  CREATININE 1.37* 1.38*  CALCIUM 9.1 8.9  MG  --  1.9    Liver Function  Tests Recent Labs    09/26/21 2216  AST 16  ALT 21  ALKPHOS 56  BILITOT 0.5  PROT 6.0*  ALBUMIN 3.3*    No results for input(s): LIPASE, AMYLASE in the last 72 hours. Cardiac Enzymes No results for input(s): CKTOTAL, CKMB, CKMBINDEX, TROPONINI in the last 72 hours.  BNP: BNP (last 3 results) Recent Labs    09/26/21 2216  BNP 1,194.7*     ProBNP (last 3 results) No results for input(s): PROBNP in the last 8760 hours.   D-Dimer No results for input(s): DDIMER in the last 72 hours. Hemoglobin A1C Recent Labs    09/27/21 1411  HGBA1C 6.8*    Fasting Lipid Panel Recent Labs    09/27/21 1411  CHOL 195  HDL 51  LDLCALC 136*  TRIG 38  CHOLHDL 3.8    Thyroid Function Tests No results for input(s): TSH, T4TOTAL, T3FREE, THYROIDAB in the last 72 hours.  Invalid input(s): FREET3  Other results:   Imaging    No results found.   Medications:     Scheduled Medications:  docusate sodium  100 mg Oral BID   enoxaparin (LOVENOX) injection  40 mg Subcutaneous Q24H   furosemide  80 mg Intravenous BID   gabapentin  300 mg Oral QHS   insulin aspart  0-5 Units Subcutaneous  QHS   insulin aspart  0-9 Units Subcutaneous TID WC   sacubitril-valsartan  1 tablet Oral BID   sodium chloride flush  3 mL Intravenous Q12H   sodium chloride flush  3 mL Intravenous Q12H   spironolactone  12.5 mg Oral Daily    Infusions:  sodium chloride     sodium chloride 10 mL/hr at 09/29/21 0549    PRN Medications: sodium chloride, acetaminophen **OR** acetaminophen, bisacodyl, hydrALAZINE, morphine injection, ondansetron **OR** ondansetron (ZOFRAN) IV, oxyCODONE, polyethylene glycol, sodium chloride flush    Patient Profile   59 y.o. male with history of HTN, HLD, DM, obesity admitted with acute HF/new CM.    Assessment/Plan    1. Acute systolic HF: -presenting with 1-2 weeks chest discomfort and dyspnea.  -Echo with EF 30-35%, RV okay, mild MR -Etiology not certain.  Previously consumed large amounts of alcohol, minimal intake last few years. No illicit drug use. SR/Sinus tach here, no arrhythmias. - has diuresed well. Weight down 29 pounds.  - SCr stable at 1.3-1.4 - Plan R/L cath today. Coronary calcifications noted on CTA. If no obstructive CAD on LHC, obtain cMRI. - Hold off on beta blocker for now d/t acute heart failure exacerbation - Entresto 24/26 mg BID - Continue spiro 12.5 mg daily - Add SGLT2i and low-dose b-blocker prior to d/c A1c 6.8 -Reports snoring. Will need sleep study.   2. Sinus tachycardia -Likely d/t HF - improved   3. Bilateral  pleural effusions -Appeared large in size on CTA - repeat CXR after cathto see if improved with diuresis   4. DM: -On metformin PTA -A1c 6.8 -Management per TRH   5. HTN: -BP elevated - now improved   6. HLD: -Lipid panel pending -With history of DM, would benefit from addition of statin or PCSK-9i depending on LHC -GI upset with atorvastatin and crestor   7. Obesity: -Body mass index is 32.56 kg/m.   7. Hx alcohol abuse -Previously consumed large quantities of alcohol, minimal intake last few years   8. Abnormal imaging -CTA 08/2019 for abdominal pain and weight loss: Enlarged prostate, irrgular opacity greater curvature body of stomach, evidence of residual sclerosing mesenteritis, thickening of several loops of jejunum likely d/t enteritis -Has not had f/u on above findings -TRH working up -PSA 4.4 -> will need outpatient Urology eval    Length of Stay: 2  Arvilla Meres, MD  09/29/2021, 10:04 AM  Advanced Heart Failure Team Pager [phone] (M-F; 7a - 5p)  Please contact [facility] for night-coverage after hours (5p -7a ) and weekends on [email or web address]

## 2021-09-29 NOTE — Progress Notes (Addendum)
PROGRESS NOTE    [name]  GUR:[id number] DOB: 07/30/1961 DOA: 09/26/2021 PCP: [name], MD   Brief Narrative:  HPI: Brett Walker is a 60 y.o. male with medical history significant of DM; HTN; and HLD presenting with SOB. For the last 1-2 weeks he has had tightness in his chest, difficulty catching his breath.  Over the weekend, his feet and calves were swelling and he was unable to sleep.  He went to his PCP Tuesday afternoon and a blood test was abnormal (D-dimer) and they called and told him to go to the ER.  He feels ok now.  He is mildly SOB, no chest tightness.  +orthopnea x 2 weeks.  He was getting injections in his eyes and was told to sleep at an angle.  He tried sleeping flat and it didn't work at all.  +PND.  No personal history of CAD or CHF.  Assessment & Plan:   Principal Problem:   Acute CHF (congestive heart failure) (HCC) Active Problems:   Essential hypertension   Diabetes mellitus type 2 in obese (HCC)   Class 1 obesity due to excess calories with body mass index (BMI) of 32.0 to 32.9 in adult  Acute/new onset systolic CHF:CXR consistent with mild vascular congestion but no frank edema; on CT has does have large pulmonary effusions which are likely due to CHF. Elevated BNP without known baseline.  Echo shows newly diagnosed systolic congestive heart failure with ejection fraction of 30 to 35% and global hypokinesis and grade 2 diastolic dysfunction.  Cardiology/heart failure team consulted.  This regimen IV Lasix 80 mg twice daily and Entresto.  Patient is net negative almost 10 L and 29 pounds.  Scheduled for left and right heart cath today.   HTN: Takes olmesartan monotherapy at home.  Has been started on Entresto here.  Blood pressure controlled.   DM type II: Hemoglobin A1c 6.8.  Continue to hold Glucophage, not on SSI, will start on SSI.  AKI: Slight increase in creatinine but stable compared to yesterday.  We will watch carefully now that he is  going to have contrast.  Hyperlipidemia: LDL 136.  He is allergic to statins.  Will defer to cardiology on this.   Abnormal CT -At the conclusion of the visit, the patient brought up a prior abnormal CT A/P -This was done in 08/2019 and was lost to f/u due to the COVID pandemic -CT had a number of findings which appear to need f/u including:             1. Prostatic enlargement and loss of fat plane between the bladder and prostate; PSA recommended.             2. Irregular opacity along the greater curvature of the stomach, ?mass, suggest EGD or UGI             3. 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 Splenic promninence             5. Small pericardial effusion -For now, PSA is ordered and in process. -If primary issue is CHF as anticipated, would defer further evaluation of above to PCP since he is not complaining of GI/GU symptoms   Obesity -Body mass index is 32.56 kg/m..  -Weight loss should be encouraged -Outpatient PCP/bariatric medicine f/u encouraged    DVT prophylaxis: Place [name] Start: 09/27/21 1705 enoxaparin (LOVENOX) injection 40 mg Start: 09/27/21 1500  Code Status: Full Code  Family Communication:  None present at bedside.  Plan of care discussed with patient in length and he verbalized understanding and agreed with it.  Status is: Inpatient  Estimated body mass index is 30.02 kg/m as calculated from the following:   Height as of this encounter:  (1.753 m).   Weight as of this encounter: 92.2 kg.  Nutritional Assessment: Body mass index is 30.02 kg/m.Marland Kitchen Seen by dietician.  I agree with the assessment and plan as outlined below: Nutrition Status:   Skin Assessment: I have examined the patient's skin and I agree with the wound assessment as performed by the wound care RN as outlined below:    Consultants:  Cardiology  Procedures:  None  Antimicrobials:  Anti-infectives (From admission, onward)    None           Subjective:  Seen and examined.  No complaints, no chest pain or shortness of breath.  Objective: Vitals:   09/28/21 2016 09/28/21 2325 09/29/21 0447 09/29/21 0858  BP: 100/73 125/90 (!) 131/95 121/84  Pulse: 93 87 91 89  Resp: Temp: 98 F (36.7 C) 98.2 F (36.8 C) 97.8 F (36.6 C) (!) 97.5 F (36.4 C)  TempSrc: Oral Oral Oral Oral  SpO2: 98% 95% 98% 97%  Weight:   92.2 kg   Height:        Intake/Output Summary (Last 24 hours) at 09/29/2021 1016 Last data filed at 09/29/2021 0859 Gross per 24 hour  Intake 240 ml  Output 3800 ml  Net -3560 ml    Filed Weights   09/27/21 1100 09/28/21 0348 09/29/21 0447  Weight: 100 kg 95.3 kg 92.2 kg    Examination:   General exam: Appears calm and comfortable  Respiratory system: Clear to auscultation. Respiratory effort normal. Cardiovascular system: S1 & S2 heard, RRR. No JVD, murmurs, rubs, gallops or clicks.  +1 pitting edema bilateral lower extremity. Gastrointestinal system: Abdomen is nondistended, soft and nontender. No organomegaly or masses felt. Normal bowel sounds heard. Central nervous system: Alert and oriented. No focal neurological deficits. Extremities: Symmetric 5 x 5 power. Skin: No rashes, lesions or ulcers.  Psychiatry: Judgement and insight appear normal. Mood & affect appropriate.    Data Reviewed: I have personally reviewed following labs and imaging studies  CBC: Recent Labs  Lab 09/26/21 2216 09/28/21 0146  WBC 5.8 6.4  NEUTROABS 3.8  --   HGB 12.6* 14.1  HCT 36.7* 42.3  MCV 89.3 89.2  PLT 214 229    Basic Metabolic Panel: Recent Labs  Lab 09/26/21 2216 09/28/21 0146 09/29/21 0155  NA 138 135 134*  K 4.7 4.5 3.8  CL 111 104 102  CO2 21* 25 23  GLUCOSE 148* 124* 143*  BUN 23* 19 28*  CREATININE 1.15 1.37* 1.38*  CALCIUM 8.5* 9.1 8.9  MG  --   --  1.9    GFR: Estimated Creatinine Clearance: 66.2 mL/min (A) (by C-G formula based on SCr of 1.38 mg/dL (H)). Liver  Function Tests: Recent Labs  Lab 09/26/21 2216  AST 16  ALT 21  ALKPHOS 56  BILITOT 0.5  PROT 6.0*  ALBUMIN 3.3*    No results for input(s): LIPASE, AMYLASE in the last 168 hours. No results for input(s): AMMONIA in the last 168 hours. Coagulation Profile: No results for input(s): INR, PROTIME in the last 168 hours. Cardiac Enzymes: No results for input(s): CKTOTAL, CKMB, CKMBINDEX, TROPONINI in the last 168  hours. BNP (last 3 results) No results for input(s): PROBNP in the last 8760 hours. HbA1C: Recent Labs    09/27/21 1411  HGBA1C 6.8*    CBG: Recent Labs  Lab 09/27/21 1148 09/28/21 1627 09/28/21 2117 09/29/21 0605  GLUCAP 145* 196* 174* 152*    Lipid Profile: Recent Labs    09/27/21 1411  CHOL 195  HDL 51  LDLCALC 136*  TRIG 38  CHOLHDL 3.8    Thyroid Function Tests: No results for input(s): TSH, T4TOTAL, FREET4, T3FREE, THYROIDAB in the last 72 hours. Anemia Panel: No results for input(s): VITAMINB12, FOLATE, FERRITIN, TIBC, IRON, RETICCTPCT in the last 72 hours. Sepsis Labs: No results for input(s): PROCALCITON, LATICACIDVEN in the last 168 hours.  Recent Results (from the past 240 hour(s))  Resp Panel by RT-PCR (Flu A&B, Covid) Nasopharyngeal Swab     Status: None   Collection Time: 09/26/21 11:35 PM   Specimen: Nasopharyngeal Swab; Nasopharyngeal(NP) swabs in vial transport medium  Result Value Ref Range Status   SARS Coronavirus 2 by RT PCR NEGATIVE NEGATIVE Final    Comment: (NOTE) SARS-CoV-2 target nucleic acids are NOT DETECTED.  The SARS-CoV-2 RNA is generally detectable in upper respiratory specimens during the acute phase of infection. The lowest concentration of SARS-CoV-2 viral copies this assay can detect is 138 copies/mL. A negative result does not preclude SARS-Cov-2 infection and should not be used as the sole basis for treatment or other patient management decisions. A negative result may occur with  improper specimen  collection/handling, submission of specimen other than nasopharyngeal swab, presence of viral mutation(s) within the areas targeted by this assay, and inadequate number of viral copies(<138 copies/mL). A negative result must be combined with clinical observations, patient history, and epidemiological information. The expected result is Negative.  Fact Sheet for Patients:  BloggerCourse.com  Fact Sheet for Healthcare Providers:  SeriousBroker.it  This test is no t yet approved or cleared by the [place] FDA and  has been authorized for detection and/or diagnosis of SARS-CoV-2 by FDA under an Emergency Use Authorization (EUA). This EUA will remain  in effect (meaning this test can be used) for the duration of the COVID-19 declaration under Section 564(b)(1) of the Act, 21 U.S.C.section 360bbb-3(b)(1), unless the authorization is terminated  or revoked sooner.       Influenza A by PCR NEGATIVE NEGATIVE Final   Influenza B by PCR NEGATIVE NEGATIVE Final    Comment: (NOTE) The Xpert Xpress SARS-CoV-2/FLU/RSV plus assay is intended as an aid in the diagnosis of influenza from Nasopharyngeal swab specimens and should not be used as a sole basis for treatment. Nasal washings and aspirates are unacceptable for Xpert Xpress SARS-CoV-2/FLU/RSV testing.  Fact Sheet for Patients: BloggerCourse.com  Fact Sheet for Healthcare Providers: SeriousBroker.it  This test is not yet approved or cleared by the [place] FDA and has been authorized for detection and/or diagnosis of SARS-CoV-2 by FDA under an Emergency Use Authorization (EUA). This EUA will remain in effect (meaning this test can be used) for the duration of the COVID-19 declaration under Section 564(b)(1) of the Act, 21 U.S.C. section 360bbb-3(b)(1), unless the authorization is terminated or revoked.  Performed at [facility], [address] [place], [place] [address]        Radiology Studies: ECHOCARDIOGRAM COMPLETE  Result Date: 09/27/2021    ECHOCARDIOGRAM REPORT   Patient Name:   LOWERY PAULLIN Date of Exam: 09/27/2021 Medical Rec #:  [id number]  Height:       69.0 in Accession #:    [id number]        Weight:       220.5 lb Date of Birth:  October 29, 1960        BSA:          2.153 m Patient Age:    59 years          BP:           158/95 mmHg Patient Gender: M                 HR:           95 bpm. Exam Location:  Inpatient Procedure: 2D Echo Indications:    congestive heart failure  History:        Patient has no prior history of Echocardiogram examinations.                 CHF; Risk Factors:Hypertension and Diabetes.  Sonographer:    Delcie Roch RDCS Referring Phys: [id number] JENNIFER YATES IMPRESSIONS  1. Left ventricular ejection fraction, by estimation, is 30 to 35%. The left ventricle has moderately decreased function. The left ventricle demonstrates global hypokinesis. There is mild left ventricular hypertrophy. Left ventricular diastolic parameters are consistent with Grade II diastolic dysfunction (pseudonormalization). Elevated left atrial pressure.  2. Right ventricular systolic function is normal. The right ventricular size is normal.  3. Left atrial size was mildly dilated.  4. A small pericardial effusion is present.  5. The mitral valve is normal in structure. Mild mitral valve regurgitation. No evidence of mitral stenosis.  6. The aortic valve is tricuspid. Aortic valve regurgitation is not visualized. Aortic valve sclerosis is present, with no evidence of aortic valve stenosis.  7. The inferior vena cava is dilated in size with >50% respiratory variability, suggesting right atrial pressure of 8 mmHg. Comparison(s): No prior Echocardiogram. FINDINGS  Left Ventricle: Left ventricular ejection fraction, by estimation, is 30 to 35%. The left ventricle has moderately decreased function.  The left ventricle demonstrates global hypokinesis. The left ventricular internal cavity size was normal in size. There is mild left ventricular hypertrophy. Left ventricular diastolic parameters are consistent with Grade II diastolic dysfunction (pseudonormalization). Elevated left atrial pressure. Right Ventricle: The right ventricular size is normal. Right ventricular systolic function is normal. Left Atrium: Left atrial size was mildly dilated. Right Atrium: Right atrial size was normal in size. Pericardium: A small pericardial effusion is present. Mitral Valve: The mitral valve is normal in structure. Mild mitral valve regurgitation. No evidence of mitral valve stenosis. Tricuspid Valve: The tricuspid valve is normal in structure. Tricuspid valve regurgitation is trivial. No evidence of tricuspid stenosis. Aortic Valve: The aortic valve is tricuspid. Aortic valve regurgitation is not visualized. Aortic valve sclerosis is present, with no evidence of aortic valve stenosis. Pulmonic Valve: The pulmonic valve was normal in structure. Pulmonic valve regurgitation is trivial. No evidence of pulmonic stenosis. Aorta: The aortic root is normal in size and structure. Venous: The inferior vena cava is dilated in size with greater than 50% respiratory variability, suggesting right atrial pressure of 8 mmHg. IAS/Shunts: No atrial level shunt detected by color flow Doppler.  LEFT VENTRICLE PLAX 2D LVIDd:         4.90 cm      Diastology LVIDs:         4.10 cm      LV e' medial:    3.70 cm/s LV PW:  1.30 cm      LV E/e' medial:  30.0 LV IVS:        1.10 cm      LV e' lateral:   10.60 cm/s LVOT diam:     2.10 cm      LV E/e' lateral: 10.5 LV SV:         48 LV SV Index:   22 LVOT Area:     3.46 cm  LV Volumes (MOD) LV vol d, MOD A4C: 116.0 ml LV vol s, MOD A4C: 79.1 ml LV SV MOD A4C:     116.0 ml RIGHT VENTRICLE             IVC RV S prime:     11.90 cm/s  IVC diam: 2.30 cm TAPSE (M-mode): 1.7 cm LEFT ATRIUM              Index        RIGHT ATRIUM           Index LA diam:        4.90 cm 2.28 cm/m   RA Area:     15.30 cm LA Vol (A2C):   75.9 ml 35.25 ml/m  RA Volume:   43.90 ml  20.39 ml/m LA Vol (A4C):   89.2 ml 41.42 ml/m LA Biplane Vol: 87.6 ml 40.68 ml/m  AORTIC VALVE LVOT Vmax:   71.80 cm/s LVOT Vmean:  50.400 cm/s LVOT VTI:    0.138 m  AORTA Ao Root diam: 3.20 cm Ao Asc diam:  3.40 cm MITRAL VALVE MV Area (PHT): 5.27 cm     SHUNTS MV Decel Time: 144 msec     Systemic VTI:  0.14 m MV E velocity: 111.00 cm/s  Systemic Diam: 2.10 cm MV A velocity: 66.80 cm/s MV E/A ratio:  1.66 Olga Millers MD Electronically signed by Olga Millers MD Signature Date/Time: 09/27/2021/2:51:07 PM    Final     Scheduled Meds:  [MAR Hold] docusate sodium  100 mg Oral BID   [MAR Hold] enoxaparin (LOVENOX) injection  40 mg Subcutaneous Q24H   [MAR Hold] gabapentin  300 mg Oral QHS   [MAR Hold] insulin aspart  0-5 Units Subcutaneous QHS   [MAR Hold] insulin aspart  0-9 Units Subcutaneous TID WC   [MAR Hold] sacubitril-valsartan  1 tablet Oral BID   [MAR Hold] sodium chloride flush  3 mL Intravenous Q12H   sodium chloride flush  3 mL Intravenous Q12H   [MAR Hold] spironolactone  12.5 mg Oral Daily   Continuous Infusions:  sodium chloride     sodium chloride 10 mL/hr at 09/29/21 0549     LOS: 2 days   Time spent: 30 minutes   Hughie Closs, MD [facility]  09/29/2021, 10:16 AM  Please page via Loretha Stapler and do not message via secure chat for anything urgent. Secure chat can be used for anything non urgent.  How to contact the [facility] Attending or Consulting provider 7A - 7P or covering provider during after hours 7P -7A, for this patient?  Check the care team in [facility] and look for a) attending/consulting [facility] provider listed and b) the [facility] team listed. Page or secure chat 7A-7P. Log into [email or web address] and use [place]'s universal password to access. If you do not have the password, please contact the hospital  operator. Locate the [facility] provider you are looking for under [facility] and page to a number that you can be directly reached. If you still have difficulty reaching  the provider, please page the [facility] (Director on Call) for the Hospitalists listed on amion for assistance.

## 2021-09-29 NOTE — Progress Notes (Signed)
Patient arrived back to 4E from Cath lab. Vital signs obtained and placed back on tele. Notified CCMD. Assessed TR band on right radial- level 0 and right brachial- level 0. Educated patient on not moving wrist while TR band in place. Call bell within reach.   [name]

## 2021-09-29 NOTE — Progress Notes (Signed)
Vital signs taken and stable, TR band removed, Tegaderm placed, no complications. Patient educated to call if any bleeding, redness, or pain.  Brett Walker

## 2021-09-29 NOTE — Consult Note (Signed)
[address]       [place] [address]             [phone]      Cardiothoracic Surgery Consultation  Reason for Consult: Severe multi-vessel coronary artery disease with severe left ventricular systolic dysfunction Referring Physician: Dr. Thomes Cake III Brett Walker is an 60 y.o. male.  HPI:   The patient is a 60 year old gentleman with a history of diabetes with peripheral neuropathy and retinopathy, hypertension, hyperlipidemia, and history of alcohol abuse who reports a 2-1/2-week history of substernal chest pain and shortness of breath that waxed and waned.  He said that it was fairly bad but he thought it would probably pass and may be related to the flu.  He then developed progressive lower extremity edema, orthopnea, and PND.  He called his PCP and was told to go to the emergency department.  D-dimer was 0.67.  He had a CTA of the chest which ruled out pulmonary embolism but did show moderate bilateral pleural effusions and mild vascular congestion.  High-sensitivity troponin was 55 and 56.  BNP was 1195.  He was negative for flu and COVID-19.  He was admitted for management of his acute heart failure.  2D echocardiogram on 09/27/2021 showed an ejection fraction of 30 to 35% with global hypokinesis and mild LVH.  There is grade 2 diastolic dysfunction.  Right ventricular systolic function was normal.  There was mild mitral regurgitation there is no aortic stenosis or insufficiency.  He underwent cardiac catheterization today showing severe multivessel coronary disease with ejection fraction of 25 to 35% by visual estimate.  Filling pressures were low and cardiac output was normal.  His family history is significant for his father having CABG at age 90 in the past 2 years.  The patient still works Electrical engineer.  He was a previous heavy alcohol user but reports minimal alcohol over the past few years.  He has never smoked.   Past Medical  History:  Diagnosis Date   Diabetes mellitus without complication (HCC)    Diabetic peripheral neuropathy (HCC)    Diabetic retinopathy (HCC)    Diverticulitis    Elevated LDL cholesterol level    Hypertension    Pharyngoesophageal dysphagia     Past Surgical History:  Procedure Laterality Date   EYE SURGERY     RIGHT/LEFT HEART CATH AND CORONARY ANGIOGRAPHY N/A 09/29/2021   Procedure: RIGHT/LEFT HEART CATH AND CORONARY ANGIOGRAPHY;  Surgeon: Dolores Patty, MD;  Location: [facility];  Service: Cardiovascular;  Laterality: N/A;   WISDOM TOOTH EXTRACTION      Family History  Problem Relation Age of Onset   Parkinson's disease Father    CAD Father    CAD Paternal Grandfather     Social History:  reports that he has never smoked. He has never used smokeless tobacco. He reports that he does not currently use alcohol. He reports that he does not use drugs.  Allergies:  Allergies  Allergen Reactions   Penicillin G Hives   Atorvastatin     Other reaction(s): fecal urgency   Crestor [Rosuvastatin]     Other reaction(s): GI cramping   Lipitor [Atorvastatin Calcium] Diarrhea   Ozempic (0.25 Or 0.5 Mg-Dose) [Semaglutide(0.25 Or 0.5mg -Dos)] Diarrhea    Medications: I have reviewed the patient's current medications. Prior to Admission:  Medications Prior to Admission  Medication Sig Dispense Refill Last Dose   bisacodyl (DULCOLAX) 5 MG  EC tablet Take 5 mg by mouth daily as needed for moderate constipation.   Past Month   gabapentin (NEURONTIN) 300 MG capsule Take 300 mg by mouth at bedtime.   09/26/2021   metFORMIN (GLUCOPHAGE-XR) 750 MG 24 hr tablet Take 750 mg by mouth 2 (two) times daily.   09/26/2021   olmesartan (BENICAR) 20 MG tablet Take 20 mg by mouth daily.   09/26/2021   furosemide (LASIX) 20 MG tablet Take 20 mg by mouth daily. (Patient not taking: Reported on 09/27/2021)   Not Taking   Scheduled:  [START ON 09/30/2021] aspirin EC  81 mg Oral Daily   docusate  sodium  100 mg Oral BID   [START ON 09/30/2021] enoxaparin (LOVENOX) injection  40 mg Subcutaneous Q24H   gabapentin  300 mg Oral QHS   insulin aspart  0-5 Units Subcutaneous QHS   insulin aspart  0-9 Units Subcutaneous TID WC   sacubitril-valsartan  1 tablet Oral BID   sodium chloride flush  3 mL Intravenous Q12H   sodium chloride flush  3 mL Intravenous Q12H   spironolactone  12.5 mg Oral Daily   Continuous:  sodium chloride 75 mL/hr at 09/29/21 1334   sodium chloride     ZOX:WRUEAV chloride, acetaminophen, bisacodyl, hydrALAZINE, labetalol, morphine injection, ondansetron (ZOFRAN) IV, oxyCODONE, polyethylene glycol, sodium chloride flush Anti-infectives (From admission, onward)    None       Results for orders placed or performed during the hospital encounter of 09/26/21 (from the past 48 hour(s))  Basic metabolic panel     Status: Abnormal   Collection Time: 09/28/21  1:46 AM  Result Value Ref Range   Sodium 135 135 - 145 mmol/L   Potassium 4.5 3.5 - 5.1 mmol/L   Chloride 104 98 - 111 mmol/L   CO2 25 22 - 32 mmol/L   Glucose, Bld 124 (H) 70 - 99 mg/dL    Comment: Glucose reference range applies only to samples taken after fasting for at least 8 hours.   BUN 19 6 - 20 mg/dL   Creatinine, Ser 4.09 (H) 0.61 - 1.24 mg/dL   Calcium 9.1 8.9 - 81.1 mg/dL   GFR, Estimated >91 >47 mL/min    Comment: (NOTE) Calculated using the CKD-EPI Creatinine Equation (2021)    Anion gap 6 5 - 15    Comment: Performed at [facility], [address], [place], [place] [address]  CBC     Status: None   Collection Time: 09/28/21  1:46 AM  Result Value Ref Range   WBC 6.4 4.0 - 10.5 K/uL   RBC 4.74 4.22 - 5.81 MIL/uL   Hemoglobin 14.1 13.0 - 17.0 g/dL   HCT 21.3 08.6 - 57.8 %   MCV 89.2 80.0 - 100.0 fL   MCH 29.7 26.0 - 34.0 pg   MCHC 33.3 30.0 - 36.0 g/dL   RDW 46.9 62.9 - 52.8 %   Platelets 229 150 - 400 K/uL   nRBC 0.0 0.0 - 0.2 %    Comment: Performed at [facility]  Lab, [address], [place], [place] [address]  Glucose, capillary     Status: Abnormal   Collection Time: 09/28/21  4:27 PM  Result Value Ref Range   Glucose-Capillary 196 (H) 70 - 99 mg/dL    Comment: Glucose reference range applies only to samples taken after fasting for at least 8 hours.  Glucose, capillary     Status: Abnormal   Collection Time: 09/28/21  9:17 PM  Result Value Ref Range   Glucose-Capillary 174 (H) 70 - 99 mg/dL    Comment: Glucose reference range applies only to samples taken after fasting for at least 8 hours.   Comment 1 Notify RN    Comment 2 Document in Chart   Basic metabolic panel     Status: Abnormal   Collection Time: 09/29/21  1:55 AM  Result Value Ref Range   Sodium 134 (L) 135 - 145 mmol/L   Potassium 3.8 3.5 - 5.1 mmol/L   Chloride 102 98 - 111 mmol/L   CO2 23 22 - 32 mmol/L   Glucose, Bld 143 (H) 70 - 99 mg/dL    Comment: Glucose reference range applies only to samples taken after fasting for at least 8 hours.   BUN 28 (H) 6 - 20 mg/dL   Creatinine, Ser 8.75 (H) 0.61 - 1.24 mg/dL   Calcium 8.9 8.9 - 64.3 mg/dL   GFR, Estimated 60 (L) >60 mL/min    Comment: (NOTE) Calculated using the CKD-EPI Creatinine Equation (2021)    Anion gap 9 5 - 15    Comment: Performed at [facility], [address], [place], [place] [address]  Magnesium     Status: None   Collection Time: 09/29/21  1:55 AM  Result Value Ref Range   Magnesium 1.9 1.7 - 2.4 mg/dL    Comment: Performed at [facility], [address], [place], [place] [address]  Glucose, capillary     Status: Abnormal   Collection Time: 09/29/21  6:05 AM  Result Value Ref Range   Glucose-Capillary 152 (H) 70 - 99 mg/dL    Comment: Glucose reference range applies only to samples taken after fasting for at least 8 hours.   Comment 1 Notify RN    Comment 2 Document in Chart   Glucose, capillary     Status: Abnormal   Collection Time: 09/29/21 11:53 AM  Result Value Ref Range    Glucose-Capillary 158 (H) 70 - 99 mg/dL    Comment: Glucose reference range applies only to samples taken after fasting for at least 8 hours.    CARDIAC CATHETERIZATION  Result Date: 09/29/2021   Ost RCA to Prox RCA lesion is 90% stenosed.   Dist RCA lesion is 99% stenosed.   RPDA lesion is 100% stenosed.   RPAV lesion is 100% stenosed.   Ost Cx to Prox Cx lesion is 80% stenosed.   1st Mrg lesion is 95% stenosed.   2nd Mrg lesion is 95% stenosed.   Prox LAD to Mid LAD lesion is 80% stenosed.   1st Diag lesion is 100% stenosed.   2nd Diag lesion is 99% stenosed.   Mid LAD to Dist LAD lesion is 75% stenosed.   The left ventricular ejection fraction is 25-35% by visual estimate. Findings: Ao = 101/71 (88) LV = 102/5 RA =  2 RV = 27/5 PA = 30/12 (19) PCW = 2 Fick cardiac output/index = 7.3/3.5 PVR = 2.2 WU SVR = 941 Ao sat = 95% PA sat = 72%, 73% SVC sat = 75% Assessment: 1. Severe 3v CAD 2. Low filling pressures with normal cardiac output 3. iCM EF ~25% 4. There was difficulty passing swan across pulmonary valve but no significant pulmonic stenosis (may be just angle of PA outflow tract) Plan/Discussion: Consult TCTS for CABG evaluation. Check cardiac MRI. Arvilla Meres, MD 11:58 AM   Review of Systems  Constitutional:  Positive for fatigue and  unexpected weight change. Negative for chills and fever.  HENT: Negative.    Eyes: Negative.   Respiratory:  Positive for shortness of breath.   Cardiovascular:  Positive for chest pain and leg swelling.  Gastrointestinal:  Positive for abdominal distention.  Endocrine: Negative.   Genitourinary: Negative.   Musculoskeletal: Negative.   Skin: Negative.   Allergic/Immunologic: Negative.   Neurological:  Negative for dizziness and syncope.  Hematological: Negative.   Psychiatric/Behavioral: Negative.    Blood pressure 107/77, pulse 92, temperature 97.8 F (36.6 C), temperature source Oral, resp. rate 17, height 5\' 9"  (1.753 m), weight 92.2 kg, SpO2 97  %. Physical Exam Constitutional:      Appearance: He is well-developed. He is obese.  HENT:     Head: Normocephalic and atraumatic.  Eyes:     Extraocular Movements: Extraocular movements intact.     Conjunctiva/sclera: Conjunctivae normal.     Pupils: Pupils are equal, round, and reactive to light.  Neck:     Vascular: No carotid bruit.  Cardiovascular:     Rate and Rhythm: Normal rate and regular rhythm.     Pulses: Normal pulses.     Heart sounds: Normal heart sounds. No murmur heard. Pulmonary:     Effort: Pulmonary effort is normal.     Breath sounds: Normal breath sounds.  Abdominal:     General: Abdomen is flat. Bowel sounds are normal.     Palpations: Abdomen is soft.  Musculoskeletal:        General: No deformity. Normal range of motion.     Cervical back: Normal range of motion and neck supple.     Comments: Has [name] on  Skin:    General: Skin is warm and dry.  Neurological:     General: No focal deficit present.     Mental Status: He is alert and oriented to person, place, and time.  Psychiatric:        Mood and Affect: Mood normal.        Behavior: Behavior normal.   ECHOCARDIOGRAM REPORT         Patient Name:   Brett Walker Date of Exam: 09/27/2021  Medical Rec #:  [id number]         Height:       69.0 in  Accession #:    [id number]        Weight:       220.5 lb  Date of Birth:  1961-04-24        BSA:          2.153 m  Patient Age:    59 years          BP:           158/95 mmHg  Patient Gender: M                 HR:           95 bpm.  Exam Location:  Inpatient   Procedure: 2D Echo   Indications:    congestive heart failure     History:        Patient has no prior history of Echocardiogram  examinations.                  CHF; Risk Factors:Hypertension and Diabetes.     Sonographer:    Delcie Roch RDCS  Referring Phys: [id number] JENNIFER YATES   IMPRESSIONS     1. Left ventricular ejection fraction, by estimation, is  30 to 35%. The   left ventricle has moderately decreased function. The left ventricle  demonstrates global hypokinesis. There is mild left ventricular  hypertrophy. Left ventricular diastolic  parameters are consistent with Grade II diastolic dysfunction  (pseudonormalization). Elevated left atrial pressure.   2. Right ventricular systolic function is normal. The right ventricular  size is normal.   3. Left atrial size was mildly dilated.   4. A small pericardial effusion is present.   5. The mitral valve is normal in structure. Mild mitral valve  regurgitation. No evidence of mitral stenosis.   6. The aortic valve is tricuspid. Aortic valve regurgitation is not  visualized. Aortic valve sclerosis is present, with no evidence of aortic  valve stenosis.   7. The inferior vena cava is dilated in size with >50% respiratory  variability, suggesting right atrial pressure of 8 mmHg.   Comparison(s): No prior Echocardiogram.   FINDINGS   Left Ventricle: Left ventricular ejection fraction, by estimation, is 30  to 35%. The left ventricle has moderately decreased function. The left  ventricle demonstrates global hypokinesis. The left ventricular internal  cavity size was normal in size.  There is mild left ventricular hypertrophy. Left ventricular diastolic  parameters are consistent with Grade II diastolic dysfunction  (pseudonormalization). Elevated left atrial pressure.   Right Ventricle: The right ventricular size is normal. Right ventricular  systolic function is normal.   Left Atrium: Left atrial size was mildly dilated.   Right Atrium: Right atrial size was normal in size.   Pericardium: A small pericardial effusion is present.   Mitral Valve: The mitral valve is normal in structure. Mild mitral valve  regurgitation. No evidence of mitral valve stenosis.   Tricuspid Valve: The tricuspid valve is normal in structure. Tricuspid  valve regurgitation is trivial. No evidence of tricuspid stenosis.    Aortic Valve: The aortic valve is tricuspid. Aortic valve regurgitation is  not visualized. Aortic valve sclerosis is present, with no evidence of  aortic valve stenosis.   Pulmonic Valve: The pulmonic valve was normal in structure. Pulmonic valve  regurgitation is trivial. No evidence of pulmonic stenosis.   Aorta: The aortic root is normal in size and structure.   Venous: The inferior vena cava is dilated in size with greater than 50%  respiratory variability, suggesting right atrial pressure of 8 mmHg.   IAS/Shunts: No atrial level shunt detected by color flow Doppler.      LEFT VENTRICLE  PLAX 2D  LVIDd:         4.90 cm      Diastology  LVIDs:         4.10 cm      LV e' medial:    3.70 cm/s  LV PW:         1.30 cm      LV E/e' medial:  30.0  LV IVS:        1.10 cm      LV e' lateral:   10.60 cm/s  LVOT diam:     2.10 cm      LV E/e' lateral: 10.5  LV SV:         48  LV SV Index:   22  LVOT Area:     3.46 cm     LV Volumes (MOD)  LV vol d, MOD A4C: 116.0 ml  LV vol s, MOD A4C: 79.1 ml  LV SV MOD A4C:     116.0 ml   RIGHT VENTRICLE  IVC  RV S prime:     11.90 cm/s  IVC diam: 2.30 cm  TAPSE (M-mode): 1.7 cm   LEFT ATRIUM             Index        RIGHT ATRIUM           Index  LA diam:        4.90 cm 2.28 cm/m   RA Area:     15.30 cm  LA Vol (A2C):   75.9 ml 35.25 ml/m  RA Volume:   43.90 ml  20.39 ml/m  LA Vol (A4C):   89.2 ml 41.42 ml/m  LA Biplane Vol: 87.6 ml 40.68 ml/m   AORTIC VALVE  LVOT Vmax:   71.80 cm/s  LVOT Vmean:  50.400 cm/s  LVOT VTI:    0.138 m     AORTA  Ao Root diam: 3.20 cm  Ao Asc diam:  3.40 cm   MITRAL VALVE  MV Area (PHT): 5.27 cm     SHUNTS  MV Decel Time: 144 msec     Systemic VTI:  0.14 m  MV E velocity: 111.00 cm/s  Systemic Diam: 2.10 cm  MV A velocity: 66.80 cm/s  MV E/A ratio:  1.66   Olga Millers MD  Electronically signed by Olga Millers MD  Signature Date/Time: 09/27/2021/2:51:07 PM         Final      Physicians  Panel Physicians Referring Physician Case Authorizing Physician  Bensimhon, Bevelyn Buckles, MD (Primary)     Procedures  RIGHT/LEFT HEART CATH AND CORONARY ANGIOGRAPHY   Conclusion      Ost RCA to Prox RCA lesion is 90% stenosed.   Dist RCA lesion is 99% stenosed.   RPDA lesion is 100% stenosed.   RPAV lesion is 100% stenosed.   Ost Cx to Prox Cx lesion is 80% stenosed.   1st Mrg lesion is 95% stenosed.   2nd Mrg lesion is 95% stenosed.   Prox LAD to Mid LAD lesion is 80% stenosed.   1st Diag lesion is 100% stenosed.   2nd Diag lesion is 99% stenosed.   Mid LAD to Dist LAD lesion is 75% stenosed.   The left ventricular ejection fraction is 25-35% by visual estimate.   Findings:   Ao = 101/71 (88) LV = 102/5 RA =  2 RV = 27/5 PA = 30/12 (19) PCW = 2 Fick cardiac output/index = 7.3/3.5 PVR = 2.2 WU SVR = 941 Ao sat = 95% PA sat = 72%, 73% SVC sat = 75%   Assessment: 1. Severe 3v CAD 2. Low filling pressures with normal cardiac output 3. iCM EF ~25% 4. There was difficulty passing swan across pulmonary valve but no significant pulmonic stenosis (may be just angle of PA outflow tract)   Plan/Discussion:   Consult TCTS for CABG evaluation. Check cardiac MRI.    Arvilla Meres, MD  11:58 AM     Indications  Acute systolic heart failure (HCC) [I50.21 (ICD-10-CM)]   Procedural Details  Technical Details The risks and indication of the procedure were explained. Consent was signed and placed on the chart. An appropriate timeout was taken prior to the procedure.   The right AC fossa was prepped and draped in the routine sterile fashion and anesthetized with 1% local lidocaine. The pre-existing PIV in the right [facility] was exchanged over a wire for a 5 FR venous sheath using a modified Seldinger technique. A standard Swan-Ganz catheter was used  for the procedure. We had significant difficulty getting the swan across the pulmonic valve. The wire went easily  but the angle made it difficult to advance the catheter. No evidence of pulmonic stenosis.   After a normal Allen's test was confirmed, the right wrist was prepped and draped in the routine sterile fashion and anesthetized with 1% local lidocaine. A 5 FR arterial sheath was then placed in the right radial artery using a modified Seldinger technique.  IV verapamil was given through the sheath. Systemic heparin was administered.  Standard catheters including a JL 3.5 and a JR 4 were used. All catheter exchanges were made over a wire.   Estimated blood loss <50 mL.   During this procedure medications were administered to achieve and maintain moderate conscious sedation while the patient's heart rate, blood pressure, and oxygen saturation were continuously monitored and I was present face-to-face 100% of this time.   Medications (Filter: Administrations occurring from 1011 to 1111 on 09/29/21)  important  Continuous medications are totaled by the amount administered until 09/29/21 1111.   midazolam (VERSED) injection (mg) Total dose:  2 mg Date/Time Rate/Dose/Volume Action   09/29/21 1026 2 mg Given    fentaNYL (SUBLIMAZE) injection (mcg) Total dose:  25 mcg Date/Time Rate/Dose/Volume Action   09/29/21 1026 25 mcg Given    lidocaine (PF) (XYLOCAINE) 1 % injection (mL) Total volume:  4 mL Date/Time Rate/Dose/Volume Action   09/29/21 1033 2 mL Given   1033 2 mL Given    Radial Cocktail/Verapamil only (mL) Total volume:  10 mL Date/Time Rate/Dose/Volume Action   09/29/21 1037 10 mL Given    heparin sodium (porcine) injection (Units) Total dose:  4,500 Units Date/Time Rate/Dose/Volume Action   09/29/21 1049 4,500 Units Given    Heparin (Porcine) in NaCl 1000-0.9 UT/500ML-% SOLN (mL) Total volume:  1,000 mL Date/Time Rate/Dose/Volume Action   09/29/21 1052 500 mL Given   1052 500 mL Given    iohexol (OMNIPAQUE) 350 MG/ML injection (mL) Total volume:  70 mL Date/Time  Rate/Dose/Volume Action   09/29/21 1104 70 mL Given    bisacodyl (DULCOLAX) EC tablet 5 mg (mg) Total dose:  Cannot be calculated* *Administration dose not documented Date/Time Rate/Dose/Volume Action   09/29/21 1015 *Not included in total MAR Hold    docusate sodium (COLACE) capsule 100 mg (mg) Total dose:  Cannot be calculated* Dosing weight:  100 *Administration dose not documented Date/Time Rate/Dose/Volume Action   09/29/21 1015 *Not included in total MAR Hold    gabapentin (NEURONTIN) capsule 300 mg (mg) Total dose:  Cannot be calculated* *Administration dose not documented Date/Time Rate/Dose/Volume Action   09/29/21 1015 *Not included in total MAR Hold    insulin aspart (novoLOG) injection 0-5 Units (Units) Total dose:  Cannot be calculated* Dosing weight:  95.3 *Administration dose not documented Date/Time Rate/Dose/Volume Action   09/29/21 1015 *Not included in total MAR Hold    insulin aspart (novoLOG) injection 0-9 Units (Units) Total dose:  Cannot be calculated* Dosing weight:  95.3 *Administration dose not documented Date/Time Rate/Dose/Volume Action   09/29/21 1015 *Not included in total MAR Hold    morphine 2 MG/ML injection 2 mg (mg) Total dose:  Cannot be calculated* Dosing weight:  100 *Administration dose not documented Date/Time Rate/Dose/Volume Action   09/29/21 1015 *Not included in total MAR Hold    oxyCODONE (Oxy IR/ROXICODONE) immediate release tablet 5 mg (mg) Total dose:  Cannot be calculated* Dosing weight:  100 *Administration dose not documented Date/Time Rate/Dose/Volume Action  09/29/21 1015 *Not included in total MAR Hold    polyethylene glycol (MIRALAX / GLYCOLAX) packet 17 g (g) Total dose:  Cannot be calculated* Dosing weight:  100 *Administration dose not documented Date/Time Rate/Dose/Volume Action   09/29/21 1015 *Not included in total MAR Hold    sacubitril-valsartan (ENTRESTO) 24-26 mg per tablet (tablet) Total dose:   Cannot be calculated* Dosing weight:  100 *Administration dose not documented Date/Time Rate/Dose/Volume Action   09/29/21 1015 *Not included in total MAR Hold    sodium chloride flush (NS) 0.9 % injection 3 mL (mL) Total dose:  Cannot be calculated* Dosing weight:  100 *Administration dose not documented Date/Time Rate/Dose/Volume Action   09/29/21 1015 *Not included in total MAR Hold    spironolactone (ALDACTONE) tablet 12.5 mg (mg) Total dose:  Cannot be calculated* Dosing weight:  95.3 *Administration dose not documented Date/Time Rate/Dose/Volume Action   09/29/21 1015 *Not included in total MAR Hold    enoxaparin (LOVENOX) injection 40 mg (mg) Total dose:  Cannot be calculated* Dosing weight:  100 *Administration dose not documented Date/Time Rate/Dose/Volume Action   09/29/21 1015 *Not included in total MAR Hold    hydrALAZINE (APRESOLINE) injection 5 mg (mg) Total dose:  Cannot be calculated* Dosing weight:  100 *Administration dose not documented Date/Time Rate/Dose/Volume Action   09/29/21 1015 *Not included in total MAR Hold    Sedation Time  Sedation Time Physician-1: 36 minutes 28 seconds Contrast  Medication Name Total Dose  iohexol (OMNIPAQUE) 350 MG/ML injection 70 mL   Radiation/Fluoro  Fluoro time: 12.2 (min) DAP: 71165 (mGycm2) Cumulative Air Kerma: 1261 (mGy) Coronary Findings  Diagnostic Dominance: Right Left Anterior Descending  Prox LAD to Mid LAD lesion is 80% stenosed.  Mid LAD to Dist LAD lesion is 75% stenosed.    First Diagonal Branch  Collaterals  1st Diag filled by collaterals from Dist LAD.    1st Diag lesion is 100% stenosed.    Second Diagonal Branch  2nd Diag lesion is 99% stenosed.    Left Circumflex  Ost Cx to Prox Cx lesion is 80% stenosed.    First Obtuse Marginal Branch  1st Mrg lesion is 95% stenosed.    Second Obtuse Marginal Branch  2nd Mrg lesion is 95% stenosed.    Right Coronary Artery  Ost RCA to Prox  RCA lesion is 90% stenosed.  Dist RCA lesion is 99% stenosed.    Right Posterior Descending Artery  RPDA lesion is 100% stenosed.    Right Posterior Atrioventricular Artery  RPAV lesion is 100% stenosed.    Third Right Posterolateral Branch  Collaterals  3rd RPL filled by collaterals from 2nd Sept.      Intervention   No interventions have been documented.   Left Heart  Left Ventricle The left ventricular ejection fraction is 25-35% by visual estimate.   Coronary Diagrams  Diagnostic Dominance: Right Intervention  Implants     No implant documentation for this case.   Syngo Images   Show images for CARDIAC CATHETERIZATION Images on Long Term Storage   Show images for Failla, Tully III Link to Procedure Log  Procedure Log    Hemo Data  Flowsheet Row Most Recent Value  Fick Cardiac Output 7.31 L/min  Fick Cardiac Output Index 3.47 (L/min)/BSA  RA A Wave 6 mmHg  RA V Wave 3 mmHg  RA Mean 2 mmHg  RV Systolic Pressure 27 mmHg  RV Diastolic Pressure 0 mmHg  RV EDP 5 mmHg  PA Systolic Pressure 30  mmHg  PA Diastolic Pressure 12 mmHg  PA Mean 19 mmHg  PW A Wave 5 mmHg  PW V Wave 6 mmHg  PW Mean 2 mmHg  AO Systolic Pressure 101 mmHg  AO Diastolic Pressure 71 mmHg  AO Mean 88 mmHg  LV Systolic Pressure 102 mmHg  LV Diastolic Pressure -2 mmHg  LV EDP 5 mmHg  AOp Systolic Pressure 91 mmHg  AOp Diastolic Pressure 66 mmHg  LVp Systolic Pressure 92 mmHg  LVp Diastolic Pressure 2 mmHg  LVp EDP Pressure 8 mmHg  QP/QS 0.87  TPVR Index 5.47 HRUI  TSVR Index 25.36 HRUI  PVR SVR Ratio 0.2  TPVR/TSVR Ratio 0.22     Assessment/Plan:  This 60 year old gentleman has 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  I think that coronary artery bypass graft surgery is the best treatment for relief of his symptoms and to prevent further myocardial loss and worsening heart failure.  I discussed the operative procedure with the patient and his sister including alternatives, benefits and risks; including but not limited to bleeding, blood transfusion, infection, stroke, myocardial infarction, graft failure, heart block requiring a permanent pacemaker, organ dysfunction, and death.  Brett Walker understands and agrees to proceed.  I would not be able to do his surgery until next Friday which is the first opening in our cardiac surgery schedule.  Dr. Gala Romney will decide if the patient needs to remain hospitalized until that time.  [name] 09/29/2021, 3:51 PM

## 2021-09-29 NOTE — Progress Notes (Signed)
[facility] consulted for CABG evaluation. °

## 2021-09-30 ENCOUNTER — Inpatient Hospital Stay (HOSPITAL_COMMUNITY): Payer: BC Managed Care – PPO

## 2021-09-30 DIAGNOSIS — I2510 Atherosclerotic heart disease of native coronary artery without angina pectoris: Secondary | ICD-10-CM | POA: Diagnosis present

## 2021-09-30 DIAGNOSIS — I255 Ischemic cardiomyopathy: Secondary | ICD-10-CM | POA: Diagnosis present

## 2021-09-30 DIAGNOSIS — I5043 Acute on chronic combined systolic (congestive) and diastolic (congestive) heart failure: Secondary | ICD-10-CM

## 2021-09-30 LAB — BASIC METABOLIC PANEL
Anion gap: 11 (ref 5–15)
BUN: 20 mg/dL (ref 6–20)
CO2: 21 mmol/L — ABNORMAL LOW (ref 22–32)
Calcium: 8.4 mg/dL — ABNORMAL LOW (ref 8.9–10.3)
Chloride: 104 mmol/L (ref 98–111)
Creatinine, Ser: 1.21 mg/dL (ref 0.61–1.24)
GFR, Estimated: >60 mL/min (ref >60)
Glucose, Bld: 127 mg/dL — ABNORMAL HIGH (ref 70–99)
Potassium: 3.6 mmol/L (ref 3.5–5.1)
Sodium: 136 mmol/L (ref 135–145)

## 2021-09-30 LAB — GLUCOSE, CAPILLARY
Glucose-Capillary: 127 mg/dL — ABNORMAL HIGH (ref 70–99)
Glucose-Capillary: 126 mg/dL — ABNORMAL HIGH (ref 70–99)
Glucose-Capillary: 275 mg/dL — ABNORMAL HIGH (ref 70–99)

## 2021-09-30 MED ORDER — ASPIRIN 81 MG PO TBEC: 81.0000 mg | DELAYED_RELEASE_TABLET | Freq: Every day | ORAL | 0 refills | Status: DC

## 2021-09-30 MED ORDER — CARVEDILOL 3.125 MG PO TABS
3.1250 mg | ORAL_TABLET | Freq: Two times a day (BID) | ORAL | Status: DC
  Administered 2021-09-30: 3.125 mg via ORAL
  Filled 2021-09-30: qty 1

## 2021-09-30 MED ORDER — PRAVASTATIN SODIUM 40 MG PO TABS: 40.0000 mg | ORAL_TABLET | Freq: Every day | ORAL | Status: DC

## 2021-09-30 MED ORDER — PRAVASTATIN SODIUM 40 MG PO TABS: 40.0000 mg | ORAL_TABLET | Freq: Every day | ORAL | 0 refills | Status: DC

## 2021-09-30 MED ORDER — FUROSEMIDE 20 MG PO TABS: 20.0000 mg | ORAL_TABLET | Freq: Every day | ORAL | Status: DC

## 2021-09-30 MED ORDER — CARVEDILOL 3.125 MG PO TABS: 3.1250 mg | ORAL_TABLET | Freq: Two times a day (BID) | ORAL | 0 refills | Status: DC

## 2021-09-30 MED ORDER — SPIRONOLACTONE 25 MG PO TABS: 12.5000 mg | ORAL_TABLET | Freq: Every day | ORAL | 0 refills | Status: DC

## 2021-09-30 MED ORDER — SACUBITRIL-VALSARTAN 24-26 MG PO TABS: 1.0000 | ORAL_TABLET | Freq: Two times a day (BID) | ORAL | 0 refills | Status: AC

## 2021-09-30 NOTE — Progress Notes (Signed)
CARDIAC REHAB PHASE I   Discussed staying in the tube/sternal precautions, and IS use. Mentioned encouraged mobility after surgery. Gave pt staying in tube and pre-op h/o. Ed done w/ pt and family member. Pt is nervous about surgery. Will continue to follow.  [id number] Joya San, MS, ACSM-CEP 09/30/2021 11:54 AM

## 2021-09-30 NOTE — Progress Notes (Signed)
Patient discharging home with sister. Both IVs removed with no complications. Discharge instructions and medication administration education given. RN answered all of patient's  questions.  [place] N Robson Trickey

## 2021-09-30 NOTE — Discharge Summary (Signed)
Physician Discharge Summary  Brett Walker:[id number] DOB: [birth year] DOA: 09/26/2021  PCP: [name], MD  Admit date: 09/26/2021 Discharge date: 09/30/2021 30 Day Unplanned Readmission Risk Score    Flowsheet Row ED to Hosp-Admission (Current) from 09/26/2021 in [facility] 4E CV SURGICAL PROGRESSIVE CARE  30 Day Unplanned Readmission Risk Score (%) 12.24 Filed at 09/30/2021 0800       This score is the patient's risk of an unplanned readmission within 30 days of being discharged (0 -100%). The score is based on dignosis, age, lab data, medications, orders, and past utilization.   Low:  0-14.9   Medium: 15-21.9   High: 22-29.9   Extreme: 30 and above          Admitted From: Home Disposition: Home  Recommendations for Outpatient Follow-up:  Follow up with PCP in 1-2 weeks Please obtain BMP/CBC in one week He will need CABG next Friday, surgical coordinator will call him Monday with surgical details. Call [facility] office or HF clinic with any questions prior to surgery.  Please follow up with your PCP on the following pending results: Unresulted Labs (From admission, onward)     Start     Ordered   10/06/21 0500  Creatinine, serum  (enoxaparin (LOVENOX)    CrCl >/= 30 ml/min)  Weekly,   R     Comments: while on enoxaparin therapy    09/29/21 1327   09/30/21 0500  Basic metabolic panel  Daily,   R      09/29/21 1721              Home Health: None Equipment/Devices: None  Discharge Condition: Stable CODE STATUS: Full code Diet recommendation: Cardiac  Subjective: Seen and examined.  Sister at the bedside.  Patient has no chest pain or shortness of breath.  Brief/Interim Summary:  Brett Walker is a 60 y.o. male with medical history significant of DM; HTN; and HLD presented with SOB. +orthopnea x 2 weeks.  CXR consistent with mild vascular congestion but no frank edema; on CT has does have large pulmonary effusions which are likely due to CHF. Elevated BNP  without known baseline. Patient was diagnosed with acute congestive heart failure and was admitted to hospital service and cardiology was consulted.  He was started on Lasix.  Echo shows newly diagnosed systolic congestive heart failure with ejection fraction of 30 to 35%.  With diuresis, he lost 29 pounds.  He then underwent cardiac cath and was found to have severe three-vessel disease.  CT surgery was consulted by heart failure team and they saw him and recommended CABG however they could not accommodate him until next Friday due to availability of the OR and staff.  Heart failure team saw him again today and since patient is doing fine without any symptoms and he walked in the hallway with no symptoms as well, cardiology cleared him for discharge.  Medications as below.  He is aware that he needs to call heart failure team if he has any questions before the surgery otherwise he will be called by surgery coordinator to arrange surgery on Friday.  HTN: Was taking olmesartan monotherapy at home.  Has been started on Entresto here.  Blood pressure controlled.   DM type II: Hemoglobin A1c 6.8.  Resume home medications.   AKI: Resolved.   Hyperlipidemia: LDL 136.  He is allergic to statins.  Cardiology recommended to prescribe him pravastatin and  plan for PCSK9 inhibitor in future.    Abnormal CT -At  the conclusion of the visit, the patient brought up a prior abnormal CT A/P -This was done in 08/2019 and was lost to f/u due to the COVID pandemic -CT had a number of findings which appear to need f/u including:             1. Prostatic enlargement and loss of fat plane between the bladder and prostate; PSA recommended.             2. Irregular opacity along the greater curvature of the stomach, ?mass, suggest EGD or UGI             3. 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 Splenic promninence             5. Small pericardial effusion -For now, PSA was ordered which was 4.4.  May  need referral to urology as outpatient. .would defer further evaluation of above to PCP since he is not complaining of GI/GU symptoms   Obesity: Weight loss encouraged.  Discharge Diagnoses:  Principal Problem:   Acute CHF (congestive heart failure) (HCC) Active Problems:   Essential hypertension   Diabetes mellitus type 2 in obese (HCC)   Class 1 obesity due to excess calories with body mass index (BMI) of 32.0 to 32.9 in adult   Ischemic cardiomyopathy   3-vessel coronary artery disease    Discharge Instructions   Allergies as of 09/30/2021       Reactions   Penicillin G Hives   Atorvastatin    Other reaction(s): fecal urgency   Crestor [rosuvastatin]    Other reaction(s): GI cramping   Lipitor [atorvastatin Calcium] Diarrhea   Ozempic (0.25 Or 0.5 Mg-dose) [semaglutide(0.25 Or 0.5mg -dos)] Diarrhea        Medication List     STOP taking these medications    olmesartan 20 MG tablet Commonly known as: BENICAR       TAKE these medications    aspirin 81 MG EC tablet Take 1 tablet (81 mg total) by mouth daily. Swallow whole. Start taking on: October 01, 2021   carvedilol 3.125 MG tablet Commonly known as: COREG Take 1 tablet (3.125 mg total) by mouth 2 (two) times daily with a meal.   Dulcolax 5 MG EC tablet Generic drug: bisacodyl Take 5 mg by mouth daily as needed for moderate constipation.   furosemide 20 MG tablet Commonly known as: LASIX Take 20 mg by mouth daily.   gabapentin 300 MG capsule Commonly known as: NEURONTIN Take 300 mg by mouth at bedtime.   metFORMIN 750 MG 24 hr tablet Commonly known as: GLUCOPHAGE-XR Take 750 mg by mouth 2 (two) times daily.   pravastatin 40 MG tablet Commonly known as: PRAVACHOL Take 1 tablet (40 mg total) by mouth daily at 6 PM.   sacubitril-valsartan 24-26 MG Commonly known as: ENTRESTO Take 1 tablet by mouth 2 (two) times daily.   spironolactone 25 MG tablet Commonly known as: ALDACTONE Take 0.5  tablets (12.5 mg total) by mouth daily. Start taking on: October 01, 2021        Follow-up Information     [name], MD Follow up in 1 week(s).   Specialty: Family Medicine Contact information: [phone] [address] [place] [address] [phone]                Allergies  Allergen Reactions   Penicillin G Hives   Atorvastatin     Other  reaction(s): fecal urgency   Crestor [Rosuvastatin]     Other reaction(s): GI cramping   Lipitor [Atorvastatin Calcium] Diarrhea   Ozempic (0.25 Or 0.5 Mg-Dose) [Semaglutide(0.25 Or 0.5mg -Dos)] Diarrhea    Consultations: Cardiology and CT surgery   Procedures/Studies: DG Chest 2 View  Result Date: 09/30/2021 CLINICAL DATA:  Pleural effusion, chest pain. EXAM: CHEST - 2 VIEW COMPARISON:  09/29/2021 FINDINGS: Improvement in bibasilar airspace disease. Small bilateral pleural effusions are present. Normal pulmonary vascularity. Upper lobes remain clear. IMPRESSION: Decreased bibasilar atelectasis.  Small bilateral pleural effusions. Electronically Signed   By: Marlan Palau M.D.   On: 09/30/2021 09:58   DG Chest 2 View  Result Date: 09/26/2021 CLINICAL DATA:  Shortness of breath. EXAM: CHEST - 2 VIEW COMPARISON:  None. FINDINGS: Small bilateral pleural effusions and bibasilar atelectasis or infiltrate. There is mild cardiomegaly with mild vascular congestion. No pneumothorax. No acute osseous pathology. Degenerative changes of spine. IMPRESSION: 1. Small bilateral pleural effusions and bibasilar atelectasis or infiltrate. 2. Mild cardiomegaly with mild vascular congestion. Electronically Signed   By: Elgie Collard M.D.   On: 09/26/2021 22:24   CT Angio Chest PE W/Cm &/Or Wo Cm  Result Date: 09/26/2021 CLINICAL DATA:  Lower extremity pain and swelling for 2 weeks with shortness of breath, initial encounter EXAM: CT ANGIOGRAPHY CHEST WITH CONTRAST TECHNIQUE: Multidetector CT imaging of the chest was performed using the  standard protocol during bolus administration of intravenous contrast. Multiplanar CT image reconstructions and MIPs were obtained to evaluate the vascular anatomy. CONTRAST:  60mL OMNIPAQUE IOHEXOL 350 MG/ML SOLN COMPARISON:  Chest x-ray from earlier in the same day. FINDINGS: Cardiovascular: Thoracic aorta shows a normal enhancement pattern. No aneurysmal dilatation or dissection is noted. Coronary calcifications are seen. No cardiac enlargement is noted. Pulmonary artery shows a normal branching pattern without intraluminal filling defect to suggest pulmonary embolism. Mediastinum/Nodes: Thoracic inlet is within normal limits. The esophagus as visualized is within normal limits. No sizable hilar or mediastinal adenopathy is noted. Lungs/Pleura: Large bilateral pleural effusions are noted. Lower lobe atelectatic changes are noted in a compensatory fashion. No focal confluent infiltrate is seen. No sizable parenchymal nodule is noted. Upper Abdomen: No acute abnormality. Musculoskeletal: Degenerative changes of the thoracic spine are noted. No acute rib abnormality is noted. Review of the MIP images confirms the above findings. IMPRESSION: No evidence pulmonary embolism. Large bilateral pleural effusions with compensatory lower lobe atelectatic changes. Electronically Signed   By: Alcide Clever M.D.   On: 09/26/2021 23:23   CARDIAC CATHETERIZATION  Result Date: 09/29/2021   Ost RCA to Prox RCA lesion is 90% stenosed.   Dist RCA lesion is 99% stenosed.   RPDA lesion is 100% stenosed.   RPAV lesion is 100% stenosed.   Ost Cx to Prox Cx lesion is 80% stenosed.   1st Mrg lesion is 95% stenosed.   2nd Mrg lesion is 95% stenosed.   Prox LAD to Mid LAD lesion is 80% stenosed.   1st Diag lesion is 100% stenosed.   2nd Diag lesion is 99% stenosed.   Mid LAD to Dist LAD lesion is 75% stenosed.   The left ventricular ejection fraction is 25-35% by visual estimate. Findings: Ao = 101/71 (88) LV = 102/5 RA =  2 RV = 27/5 PA  = 30/12 (19) PCW = 2 Fick cardiac output/index = 7.3/3.5 PVR = 2.2 WU SVR = 941 Ao sat = 95% PA sat = 72%, 73% SVC sat = 75% Assessment: 1. Severe 3v  CAD 2. Low filling pressures with normal cardiac output 3. iCM EF ~25% 4. There was difficulty passing swan across pulmonary valve but no significant pulmonic stenosis (may be just angle of PA outflow tract) Plan/Discussion: Consult TCTS for CABG evaluation. Check cardiac MRI. Arvilla Meres, MD 11:58 AM  US Venous Img Lower Bilateral  Result Date: 09/26/2021 CLINICAL DATA:  Shortness of breath. EXAM: BILATERAL LOWER EXTREMITY VENOUS DOPPLER ULTRASOUND TECHNIQUE: Gray-scale sonography with compression, as well as color and duplex ultrasound, were performed to evaluate the deep venous system(s) from the level of the common femoral vein through the popliteal and proximal calf veins. COMPARISON:  None. FINDINGS: VENOUS Normal compressibility of the common femoral, superficial femoral, and popliteal veins, as well as the visualized calf veins. Visualized portions of profunda femoral vein and great saphenous vein unremarkable. No filling defects to suggest DVT on grayscale or color Doppler imaging. Doppler waveforms show normal direction of venous flow, normal respiratory plasticity and response to augmentation. OTHER None. Limitations: none IMPRESSION: Negative. Electronically Signed   By: Darliss Cheney M.D.   On: 09/26/2021 23:14   DG CHEST PORT 1 VIEW  Result Date: 09/29/2021 CLINICAL DATA:  Short of breath, chest pain EXAM: PORTABLE CHEST 1 VIEW COMPARISON:  09/26/2021 FINDINGS: Single frontal view of the chest demonstrates a stable cardiac silhouette. There is mild residual central vascular congestion, with interval resolution of the bibasilar consolidation and effusions seen previously. No pneumothorax. No acute bony abnormalities. IMPRESSION: 1. Improved volume status, with only minimal residual central vascular congestion. Electronically Signed   By:  Sharlet Salina M.D.   On: 09/29/2021 19:09   ECHOCARDIOGRAM COMPLETE  Result Date: 09/27/2021    ECHOCARDIOGRAM REPORT   Patient Name:   ARGENIS KUMARI Date of Exam: 09/27/2021 Medical Rec #:  [id number]         Height:       69.0 in Accession #:    [id number]        Weight:       220.5 lb Date of Birth:  11-07-1960        BSA:          2.153 m Patient Age:    59 years          BP:           158/95 mmHg Patient Gender: M                 HR:           95 bpm. Exam Location:  Inpatient Procedure: 2D Echo Indications:    congestive heart failure  History:        Patient has no prior history of Echocardiogram examinations.                 CHF; Risk Factors:Hypertension and Diabetes.  Sonographer:    Delcie Roch RDCS Referring Phys: [id number] JENNIFER YATES IMPRESSIONS  1. Left ventricular ejection fraction, by estimation, is 30 to 35%. The left ventricle has moderately decreased function. The left ventricle demonstrates global hypokinesis. There is mild left ventricular hypertrophy. Left ventricular diastolic parameters are consistent with Grade II diastolic dysfunction (pseudonormalization). Elevated left atrial pressure.  2. Right ventricular systolic function is normal. The right ventricular size is normal.  3. Left atrial size was mildly dilated.  4. A small pericardial effusion is present.  5. The mitral valve is normal in structure. Mild mitral valve regurgitation. No evidence of mitral stenosis.  6. The aortic valve is  tricuspid. Aortic valve regurgitation is not visualized. Aortic valve sclerosis is present, with no evidence of aortic valve stenosis.  7. The inferior vena cava is dilated in size with >50% respiratory variability, suggesting right atrial pressure of 8 mmHg. Comparison(s): No prior Echocardiogram. FINDINGS  Left Ventricle: Left ventricular ejection fraction, by estimation, is 30 to 35%. The left ventricle has moderately decreased function. The left ventricle demonstrates global hypokinesis.  The left ventricular internal cavity size was normal in size. There is mild left ventricular hypertrophy. Left ventricular diastolic parameters are consistent with Grade II diastolic dysfunction (pseudonormalization). Elevated left atrial pressure. Right Ventricle: The right ventricular size is normal. Right ventricular systolic function is normal. Left Atrium: Left atrial size was mildly dilated. Right Atrium: Right atrial size was normal in size. Pericardium: A small pericardial effusion is present. Mitral Valve: The mitral valve is normal in structure. Mild mitral valve regurgitation. No evidence of mitral valve stenosis. Tricuspid Valve: The tricuspid valve is normal in structure. Tricuspid valve regurgitation is trivial. No evidence of tricuspid stenosis. Aortic Valve: The aortic valve is tricuspid. Aortic valve regurgitation is not visualized. Aortic valve sclerosis is present, with no evidence of aortic valve stenosis. Pulmonic Valve: The pulmonic valve was normal in structure. Pulmonic valve regurgitation is trivial. No evidence of pulmonic stenosis. Aorta: The aortic root is normal in size and structure. Venous: The inferior vena cava is dilated in size with greater than 50% respiratory variability, suggesting right atrial pressure of 8 mmHg. IAS/Shunts: No atrial level shunt detected by color flow Doppler.  LEFT VENTRICLE PLAX 2D LVIDd:         4.90 cm      Diastology LVIDs:         4.10 cm      LV e' medial:    3.70 cm/s LV PW:         1.30 cm      LV E/e' medial:  30.0 LV IVS:        1.10 cm      LV e' lateral:   10.60 cm/s LVOT diam:     2.10 cm      LV E/e' lateral: 10.5 LV SV:         48 LV SV Index:   22 LVOT Area:     3.46 cm  LV Volumes (MOD) LV vol d, MOD A4C: 116.0 ml LV vol s, MOD A4C: 79.1 ml LV SV MOD A4C:     116.0 ml RIGHT VENTRICLE             IVC RV S prime:     11.90 cm/s  IVC diam: 2.30 cm TAPSE (M-mode): 1.7 cm LEFT ATRIUM             Index        RIGHT ATRIUM           Index LA diam:         4.90 cm 2.28 cm/m   RA Area:     15.30 cm LA Vol (A2C):   75.9 ml 35.25 ml/m  RA Volume:   43.90 ml  20.39 ml/m LA Vol (A4C):   89.2 ml 41.42 ml/m LA Biplane Vol: 87.6 ml 40.68 ml/m  AORTIC VALVE LVOT Vmax:   71.80 cm/s LVOT Vmean:  50.400 cm/s LVOT VTI:    0.138 m  AORTA Ao Root diam: 3.20 cm Ao Asc diam:  3.40 cm MITRAL VALVE MV Area (PHT): 5.27 cm     SHUNTS  MV Decel Time: 144 msec     Systemic VTI:  0.14 m MV E velocity: 111.00 cm/s  Systemic Diam: 2.10 cm MV A velocity: 66.80 cm/s MV E/A ratio:  1.66 Olga Millers MD Electronically signed by Olga Millers MD Signature Date/Time: 09/27/2021/2:51:07 PM    Final      Discharge Exam: Vitals:   09/30/21 0428 09/30/21 0805  BP: 128/81 118/78  Pulse: 88 92  Resp: 20 15  Temp: (!) 97.5 F (36.4 C) 98.5 F (36.9 C)  SpO2: 92% 93%   Vitals:   09/29/21 1947 09/30/21 0014 09/30/21 0428 09/30/21 0805  BP: 108/71 120/83 128/81 118/78  Pulse: 99 85 88 92  Resp: Temp: 98.4 F (36.9 C) 98.3 F (36.8 C) (!) 97.5 F (36.4 C) 98.5 F (36.9 C)  TempSrc: Oral Oral Oral Oral  SpO2: 95% 93% 92% 93%  Weight:   91.7 kg   Height:        General: Pt is alert, awake, not in acute distress Cardiovascular: RRR, S1/S2 +, no rubs, no gallops Respiratory: CTA bilaterally, no wheezing, no rhonchi Abdominal: Soft, NT, ND, bowel sounds + Extremities: no edema, no cyanosis    The results of significant diagnostics from this hospitalization (including imaging, microbiology, ancillary and laboratory) are listed below for reference.     Microbiology: Recent Results (from the past 240 hour(s))  Resp Panel by RT-PCR (Flu A&B, Covid) Nasopharyngeal Swab     Status: None   Collection Time: 09/26/21 11:35 PM   Specimen: Nasopharyngeal Swab; Nasopharyngeal(NP) swabs in vial transport medium  Result Value Ref Range Status   SARS Coronavirus 2 by RT PCR NEGATIVE NEGATIVE Final    Comment: (NOTE) SARS-CoV-2 target nucleic acids are NOT  DETECTED.  The SARS-CoV-2 RNA is generally detectable in upper respiratory specimens during the acute phase of infection. The lowest concentration of SARS-CoV-2 viral copies this assay can detect is 138 copies/mL. A negative result does not preclude SARS-Cov-2 infection and should not be used as the sole basis for treatment or other patient management decisions. A negative result may occur with  improper specimen collection/handling, submission of specimen other than nasopharyngeal swab, presence of viral mutation(s) within the areas targeted by this assay, and inadequate number of viral copies(<138 copies/mL). A negative result must be combined with clinical observations, patient history, and epidemiological information. The expected result is Negative.  Fact Sheet for Patients:  BloggerCourse.com  Fact Sheet for Healthcare Providers:  SeriousBroker.it  This test is no t yet approved or cleared by the [place] FDA and  has been authorized for detection and/or diagnosis of SARS-CoV-2 by FDA under an Emergency Use Authorization (EUA). This EUA will remain  in effect (meaning this test can be used) for the duration of the COVID-19 declaration under Section 564(b)(1) of the Act, 21 U.S.C.section 360bbb-3(b)(1), unless the authorization is terminated  or revoked sooner.       Influenza A by PCR NEGATIVE NEGATIVE Final   Influenza B by PCR NEGATIVE NEGATIVE Final    Comment: (NOTE) The Xpert Xpress SARS-CoV-2/FLU/RSV plus assay is intended as an aid in the diagnosis of influenza from Nasopharyngeal swab specimens and should not be used as a sole basis for treatment. Nasal washings and aspirates are unacceptable for Xpert Xpress SARS-CoV-2/FLU/RSV testing.  Fact Sheet for Patients: BloggerCourse.com  Fact Sheet for Healthcare Providers: SeriousBroker.it  This test is not yet  approved or cleared by the [place] FDA and has been  authorized for detection and/or diagnosis of SARS-CoV-2 by FDA under an Emergency Use Authorization (EUA). This EUA will remain in effect (meaning this test can be used) for the duration of the COVID-19 declaration under Section 564(b)(1) of the Act, 21 U.S.C. section 360bbb-3(b)(1), unless the authorization is terminated or revoked.  Performed at [facility], [address] [place], [place] [address]      Labs: BNP (last 3 results) Recent Labs    09/26/21 2216  BNP 1,194.7*   Basic Metabolic Panel: Recent Labs  Lab 09/26/21 2216 09/28/21 0146 09/29/21 0155 09/29/21 1038 09/29/21 1048 09/29/21 1053 09/29/21 1104 09/30/21 0126  NA 138 135 134* 148* 138 145 145 136  K 4.7 4.5 3.8 2.8* 4.0 3.2* 3.1* 3.6  CL 111 104 102  --   --   --   --  104  CO2 21* 25 23  --   --   --   --  21*  GLUCOSE 148* 124* 143*  --   --   --   --  127*  BUN 23* 19 28*  --   --   --   --  20  CREATININE 1.15 1.37* 1.38*  --   --   --   --  1.21  CALCIUM 8.5* 9.1 8.9  --   --   --   --  8.4*  MG  --   --  1.9  --   --   --   --   --    Liver Function Tests: Recent Labs  Lab 09/26/21 2216  AST 16  ALT 21  ALKPHOS 56  BILITOT 0.5  PROT 6.0*  ALBUMIN 3.3*   No results for input(s): LIPASE, AMYLASE in the last 168 hours. No results for input(s): AMMONIA in the last 168 hours. CBC: Recent Labs  Lab 09/26/21 2216 09/28/21 0146 09/29/21 1038 09/29/21 1048 09/29/21 1053 09/29/21 1104  WBC 5.8 6.4  --   --   --   --   NEUTROABS 3.8  --   --   --   --   --   HGB 12.6* 14.1 10.9* 14.3 12.2* 11.6*  HCT 36.7* 42.3 32.0* 42.0 36.0* 34.0*  MCV 89.3 89.2  --   --   --   --   PLT 214 229  --   --   --   --    Cardiac Enzymes: No results for input(s): CKTOTAL, CKMB, CKMBINDEX, TROPONINI in the last 168 hours. BNP: Invalid input(s): POCBNP CBG: Recent Labs  Lab 09/29/21 1153 09/29/21 1727 09/29/21 2118  09/30/21 0555 09/30/21 0800  GLUCAP 158* 146* 184* 127* 126*   D-Dimer No results for input(s): DDIMER in the last 72 hours. Hgb A1c Recent Labs    09/27/21 1411  HGBA1C 6.8*   Lipid Profile Recent Labs    09/27/21 1411  CHOL 195  HDL 51  LDLCALC 136*  TRIG 38  CHOLHDL 3.8   Thyroid function studies No results for input(s): TSH, T4TOTAL, T3FREE, THYROIDAB in the last 72 hours.  Invalid input(s): FREET3 Anemia work up No results for input(s): VITAMINB12, FOLATE, FERRITIN, TIBC, IRON, RETICCTPCT in the last 72 hours. Urinalysis No results found for: COLORURINE, APPEARANCEUR, LABSPEC, PHURINE, GLUCOSEU, HGBUR, BILIRUBINUR, KETONESUR, PROTEINUR, UROBILINOGEN, NITRITE, LEUKOCYTESUR Sepsis Labs Invalid input(s): PROCALCITONIN,  WBC,  LACTICIDVEN Microbiology Recent Results (from the past 240 hour(s))  Resp Panel by RT-PCR (Flu A&B, Covid) Nasopharyngeal Swab     Status: None  Collection Time: 09/26/21 11:35 PM   Specimen: Nasopharyngeal Swab; Nasopharyngeal(NP) swabs in vial transport medium  Result Value Ref Range Status   SARS Coronavirus 2 by RT PCR NEGATIVE NEGATIVE Final    Comment: (NOTE) SARS-CoV-2 target nucleic acids are NOT DETECTED.  The SARS-CoV-2 RNA is generally detectable in upper respiratory specimens during the acute phase of infection. The lowest concentration of SARS-CoV-2 viral copies this assay can detect is 138 copies/mL. A negative result does not preclude SARS-Cov-2 infection and should not be used as the sole basis for treatment or other patient management decisions. A negative result may occur with  improper specimen collection/handling, submission of specimen other than nasopharyngeal swab, presence of viral mutation(s) within the areas targeted by this assay, and inadequate number of viral copies(<138 copies/mL). A negative result must be combined with clinical observations, patient history, and epidemiological information. The expected  result is Negative.  Fact Sheet for Patients:  BloggerCourse.com  Fact Sheet for Healthcare Providers:  SeriousBroker.it  This test is no t yet approved or cleared by the [place] FDA and  has been authorized for detection and/or diagnosis of SARS-CoV-2 by FDA under an Emergency Use Authorization (EUA). This EUA will remain  in effect (meaning this test can be used) for the duration of the COVID-19 declaration under Section 564(b)(1) of the Act, 21 U.S.C.section 360bbb-3(b)(1), unless the authorization is terminated  or revoked sooner.       Influenza A by PCR NEGATIVE NEGATIVE Final   Influenza B by PCR NEGATIVE NEGATIVE Final    Comment: (NOTE) The Xpert Xpress SARS-CoV-2/FLU/RSV plus assay is intended as an aid in the diagnosis of influenza from Nasopharyngeal swab specimens and should not be used as a sole basis for treatment. Nasal washings and aspirates are unacceptable for Xpert Xpress SARS-CoV-2/FLU/RSV testing.  Fact Sheet for Patients: BloggerCourse.com  Fact Sheet for Healthcare Providers: SeriousBroker.it  This test is not yet approved or cleared by the [place] FDA and has been authorized for detection and/or diagnosis of SARS-CoV-2 by FDA under an Emergency Use Authorization (EUA). This EUA will remain in effect (meaning this test can be used) for the duration of the COVID-19 declaration under Section 564(b)(1) of the Act, 21 U.S.C. section 360bbb-3(b)(1), unless the authorization is terminated or revoked.  Performed at [facility], [address] [place], [place] [address]      Time coordinating discharge: Over 30 minutes  SIGNED:   Hughie Closs, MD  [facility] 09/30/2021, 11:19 AM  If 7PM-7AM, please contact night-coverage [email or web address]

## 2021-09-30 NOTE — Progress Notes (Signed)
Patient ID: Brett Walker, male   DOB: [birth year], 60 y.o.   MRN: [id number]     Advanced Heart Failure Rounding Note  PCP-Cardiologist: None   Subjective:    Cath yesterday with severe 3VD.  RHC with mean RA 2, PA 34/12, mean PCWP 2, CI 3.5.    No complaint this morning, no chest pain.    Objective:   Weight Range: 91.7 kg Body mass index is 29.85 kg/m.   Vital Signs:   Temp:  [97.5 F (36.4 C)-98.5 F (36.9 C)] 98.5 F (36.9 C) (12/10 0805) Pulse Rate:  [85-99] 92 (12/10 0805) Resp:  [14-20] 15 (12/10 0805) BP: (104-128)/(71-83) 118/78 (12/10 0805) SpO2:  [92 %-97 %] 93 % (12/10 0805) Weight:  [91.7 kg] 91.7 kg (12/10 0428) Last BM Date: 09/27/21  Weight change: Filed Weights   09/28/21 0348 09/29/21 0447 09/30/21 0428  Weight: 95.3 kg 92.2 kg 91.7 kg    Intake/Output:   Intake/Output Summary (Last 24 hours) at 09/30/2021 1057 Last data filed at 09/30/2021 0018 Gross per 24 hour  Intake 853.12 ml  Output 900 ml  Net -46.88 ml      Physical Exam    General: NAD Neck: No JVD, no thyromegaly or thyroid nodule.  Lungs: Clear to auscultation bilaterally with normal respiratory effort. CV: Nondisplaced PMI.  Heart regular S1/S2, no S3/S4, no murmur.  No peripheral edema.   Abdomen: Soft, nontender, no hepatosplenomegaly, no distention.  Skin: Intact without lesions or rashes.  Neurologic: Alert and oriented x 3.  Psych: Normal affect. Extremities: No clubbing or cyanosis.  HEENT: Normal.    Telemetry   SR 80-90s, no significant arrhythmias.  Personally reviewed   Labs    CBC Recent Labs    09/28/21 0146 09/29/21 1038 09/29/21 1053 09/29/21 1104  WBC 6.4  --   --   --   HGB 14.1   < > 12.2* 11.6*  HCT 42.3   < > 36.0* 34.0*  MCV 89.2  --   --   --   PLT 229  --   --   --    < > = values in this interval not displayed.   Basic Metabolic Panel Recent Labs    01/60/10 0155 09/29/21 1038 09/29/21 1104 09/30/21 0126  NA 134*   < >  145 136  K 3.8   < > 3.1* 3.6  CL 102  --   --  104  CO2 23  --   --  21*  GLUCOSE 143*  --   --  127*  BUN 28*  --   --  20  CREATININE 1.38*  --   --  1.21  CALCIUM 8.9  --   --  8.4*  MG 1.9  --   --   --    < > = values in this interval not displayed.   Liver Function Tests No results for input(s): AST, ALT, ALKPHOS, BILITOT, PROT, ALBUMIN in the last 72 hours. No results for input(s): LIPASE, AMYLASE in the last 72 hours. Cardiac Enzymes No results for input(s): CKTOTAL, CKMB, CKMBINDEX, TROPONINI in the last 72 hours.  BNP: BNP (last 3 results) Recent Labs    09/26/21 2216  BNP 1,194.7*    ProBNP (last 3 results) No results for input(s): PROBNP in the last 8760 hours.   D-Dimer No results for input(s): DDIMER in the last 72 hours. Hemoglobin A1C Recent Labs    09/27/21 1411  HGBA1C 6.8*  Fasting Lipid Panel Recent Labs    09/27/21 1411  CHOL 195  HDL 51  LDLCALC 136*  TRIG 38  CHOLHDL 3.8   Thyroid Function Tests No results for input(s): TSH, T4TOTAL, T3FREE, THYROIDAB in the last 72 hours.  Invalid input(s): FREET3  Other results:   Imaging    DG Chest 2 View  Result Date: 09/30/2021 CLINICAL DATA:  Pleural effusion, chest pain. EXAM: CHEST - 2 VIEW COMPARISON:  09/29/2021 FINDINGS: Improvement in bibasilar airspace disease. Small bilateral pleural effusions are present. Normal pulmonary vascularity. Upper lobes remain clear. IMPRESSION: Decreased bibasilar atelectasis.  Small bilateral pleural effusions. Electronically Signed   By: Marlan Palau M.D.   On: 09/30/2021 09:58   DG CHEST PORT 1 VIEW  Result Date: 09/29/2021 CLINICAL DATA:  Short of breath, chest pain EXAM: PORTABLE CHEST 1 VIEW COMPARISON:  09/26/2021 FINDINGS: Single frontal view of the chest demonstrates a stable cardiac silhouette. There is mild residual central vascular congestion, with interval resolution of the bibasilar consolidation and effusions seen previously. No  pneumothorax. No acute bony abnormalities. IMPRESSION: 1. Improved volume status, with only minimal residual central vascular congestion. Electronically Signed   By: Sharlet Salina M.D.   On: 09/29/2021 19:09     Medications:     Scheduled Medications:  aspirin EC  81 mg Oral Daily   docusate sodium  100 mg Oral BID   enoxaparin (LOVENOX) injection  40 mg Subcutaneous Q24H   gabapentin  300 mg Oral QHS   insulin aspart  0-5 Units Subcutaneous QHS   insulin aspart  0-9 Units Subcutaneous TID WC   sacubitril-valsartan  1 tablet Oral BID   sodium chloride flush  3 mL Intravenous Q12H   sodium chloride flush  3 mL Intravenous Q12H   spironolactone  12.5 mg Oral Daily    Infusions:  sodium chloride      PRN Medications: sodium chloride, acetaminophen, bisacodyl, morphine injection, ondansetron (ZOFRAN) IV, oxyCODONE, polyethylene glycol, sodium chloride flush, zolpidem    Patient Profile   60 y.o. male with history of HTN, HLD, DM, obesity admitted with acute HF/new CM.    Assessment/Plan    1. Acute systolic HF: - presenting with 1-2 weeks chest discomfort and dyspnea.  - Echo with EF 30-35%, RV okay, mild MR - Suspect ischemic cardiomyopathy based on 12/9 cath. - has diuresed well. Weight down 30 pounds. RHC yesterday with low filling pressures.  Will send him home on Lasix 20 mg daily until CABG, can start tomorrow.  - SCr stable 1.2 today.  - Can add low dose Coreg 3.125 mg bid.  - Entresto 24/26 mg BID - Continue spiro 12.5 mg daily - Eventual SGLT2 inhibitor.  - Reports snoring. Will need sleep study.   2. Sinus tachycardia - Likely d/t HF - improved   3. Bilateral  pleural effusions - Appeared large in size on CTA - repeat CXR showed significant improvement.    4. DM: -On metformin PTA -A1c 6.8 -Management per TRH   5. HTN: - Controlled.    6. HLD: -Lipid panel pending -With history of DM, would benefit from addition of statin or PCSK-9i depending  on LHC -GI upset with atorvastatin and Crestor.  With CAD, for now would start pravastatin, plan for PCSK9 inhibitor in future.    7. Obesity: -Body mass index is 32.56 kg/m.   7. Hx alcohol abuse -Previously consumed large quantities of alcohol, minimal intake last few years   8. Abnormal imaging -CTA 08/2019  for abdominal pain and weight loss: Enlarged prostate, irrgular opacity greater curvature body of stomach, evidence of residual sclerosing mesenteritis, thickening of several loops of jejunum likely d/t enteritis -Has not had f/u on above findings -TRH working up -PSA 4.4 -> will need outpatient Urology eval  9. CAD - Severe 3 vessel disease on cath yesterday.  - Cannot get CABG until next Friday.  - No chest pain, has walked in halls without significant symptoms. No arrhythmias. Think he could go home for a few days then return for CABG.   I think he can go home today.  He has walked without chest pain or dyspnea.  He will need CABG next Friday, surgical coordinator will call him Monday with surgical details (discussed with Dr. Dorris Fetch).  Meds for home: Lasix 20 mg daily (start tomorrow), Coreg 3.125 mg bid, Entresto 24/26 bid, spironolactone 12.5 daily, pravastatin 40 mg daily, ASA 81 daily, home diabetes regimen.  Call [facility] office or HF clinic with any questions prior to surgery.     Length of Stay: 3  Marca Ancona, MD  09/30/2021, 10:57 AM  Advanced Heart Failure Team Pager [phone] (M-F; 7a - 5p)  Please contact [facility] for night-coverage after hours (5p -7a ) and weekends on [email or web address]

## 2021-10-01 ENCOUNTER — Other Ambulatory Visit: Payer: Self-pay | Admitting: *Deleted

## 2021-10-01 DIAGNOSIS — I2510 Atherosclerotic heart disease of native coronary artery without angina pectoris: Secondary | ICD-10-CM

## 2021-10-03 NOTE — Progress Notes (Signed)
Surgical Instructions    Your procedure is scheduled on 10/06/21.  Report to [facility] Main Entrance "A" at 5:30 A.M., then check in with the Admitting office.  Call this number if you have problems the morning of surgery:  [phone]   If you have any questions prior to your surgery date call [phone]: Open Monday-Friday 8am-4pm    Remember:  Do not eat or drink after midnight the night before your surgery      Take these medicines the morning of surgery with A SIP OF WATER  carvedilol (COREG)   As of today, STOP taking any Aspirin (unless otherwise instructed by your surgeon) Aleve, Naproxen, Ibuprofen, Motrin, Advil, Goody's, BC's, all herbal medications, fish oil, and all vitamins.  WHAT DO I DO ABOUT MY DIABETES MEDICATION?   Do not take oral diabetes medicines (pills) the morning of surgery.     THE MORNING OF SURGERY, do not take metFORMIN (GLUCOPHAGE-XR).  The day of surgery, do not take other diabetes injectables, including Byetta (exenatide), Bydureon (exenatide ER), Victoza (liraglutide), or Trulicity (dulaglutide).  If your CBG is greater than 220 mg/dL, you may take  of your sliding scale (correction) dose of insulin.   HOW TO MANAGE YOUR DIABETES BEFORE AND AFTER SURGERY  Why is it important to control my blood sugar before and after surgery? Improving blood sugar levels before and after surgery helps healing and can limit problems. A way of improving blood sugar control is eating a healthy diet by:  Eating less sugar and carbohydrates  Increasing activity/exercise  Talking with your doctor about reaching your blood sugar goals High blood sugars (greater than 180 mg/dL) can raise your risk of infections and slow your recovery, so you will need to focus on controlling your diabetes during the weeks before surgery. Make sure that the doctor who takes care of your diabetes knows about your planned surgery including the date and location.  How do I  manage my blood sugar before surgery? Check your blood sugar at least 4 times a day, starting 2 days before surgery, to make sure that the level is not too high or low.  Check your blood sugar the morning of your surgery when you wake up and every 2 hours until you get to the Short Stay unit.  If your blood sugar is less than 70 mg/dL, you will need to treat for low blood sugar: Do not take insulin. Treat a low blood sugar (less than 70 mg/dL) with  cup of clear juice (cranberry or apple), 4 glucose tablets, OR glucose gel. Recheck blood sugar in 15 minutes after treatment (to make sure it is greater than 70 mg/dL). If your blood sugar is not greater than 70 mg/dL on recheck, call [phone] for further instructions. Report your blood sugar to the short stay nurse when you get to Short Stay.  If you are admitted to the hospital after surgery: Your blood sugar will be checked by the staff and you will probably be given insulin after surgery (instead of oral diabetes medicines) to make sure you have good blood sugar levels. The goal for blood sugar control after surgery is 80-180 mg/dL.    After your COVID test   You are not required to quarantine however you are required to wear a well-fitting mask when you are out and around people not in your household.  If your mask becomes wet or soiled, replace with a new one.  Wash your hands often with soap and water  for 20 seconds or clean your hands with an alcohol-based hand sanitizer that contains at least 60% alcohol.  Do not share personal items.  Notify your provider: if you are in close contact with someone who has COVID  or if you develop a fever of 100.4 or greater, sneezing, cough, sore throat, shortness of breath or body aches.             Do not wear jewelry or makeup Do not wear lotions, powders, perfumes/colognes, or deodorant. Men may shave face and neck. Do not bring valuables to the hospital. DO Not wear nail polish, gel  polish, artificial nails, or any other type of covering on natural nails including finger and toenails. If patients have artificial nails, gel coating, etc. that need to be removed by a nail salon, please have this removed prior to surgery or surgery may need to be canceled/delayed if the surgeon/ anesthesia feels like the patient is unable to be adequately monitored.             [facility] is not responsible for any belongings or valuables.  Do NOT Smoke (Tobacco/Vaping)  24 hours prior to your procedure  If you use a CPAP at night, you may bring your mask for your overnight stay.   Contacts, glasses, hearing aids, dentures or partials may not be worn into surgery, please bring cases for these belongings   For patients admitted to the hospital, discharge time will be determined by your treatment team.   Patients discharged the day of surgery will not be allowed to drive home, and someone needs to stay with them for 24 hours.  NO VISITORS WILL BE ALLOWED IN PRE-OP WHERE PATIENTS ARE PREPPED FOR SURGERY.  ONLY 1 SUPPORT PERSON MAY BE PRESENT IN THE WAITING ROOM WHILE YOU ARE IN SURGERY.  IF YOU ARE TO BE ADMITTED, ONCE YOU ARE IN YOUR ROOM YOU WILL BE ALLOWED TWO (2) VISITORS. 1 (ONE) VISITOR MAY STAY OVERNIGHT BUT MUST ARRIVE TO THE ROOM BY 8pm.  Minor children may have two parents present. Special consideration for safety and communication needs will be reviewed on a case by case basis.  Special instructions:    Oral Hygiene is also important to reduce your risk of infection.  Remember - BRUSH YOUR TEETH THE MORNING OF SURGERY WITH YOUR REGULAR TOOTHPASTE   [facility]- Preparing For Surgery  Before surgery, you can play an important role. Because skin is not sterile, your skin needs to be as free of germs as possible. You can reduce the number of germs on your skin by washing with CHG (chlorahexidine gluconate) Soap before surgery.  CHG is an antiseptic cleaner which kills germs and bonds  with the skin to continue killing germs even after washing.     Please do not use if you have an allergy to CHG or antibacterial soaps. If your skin becomes reddened/irritated stop using the CHG.  Do not shave (including legs and underarms) for at least 48 hours prior to first CHG shower. It is OK to shave your face.  Please follow these instructions carefully.     Shower the NIGHT BEFORE SURGERY and the MORNING OF SURGERY with CHG Soap.   If you chose to wash your hair, wash your hair first as usual with your normal shampoo. After you shampoo, rinse your hair and body thoroughly to remove the shampoo.  Then [employer] and genitals (private parts) with your normal soap and rinse thoroughly to remove soap.  After that Use CHG Soap as you would any other liquid soap. You can apply CHG directly to the skin and wash gently with a scrungie or a clean washcloth.   Apply the CHG Soap to your body ONLY FROM THE NECK DOWN.  Do not use on open wounds or open sores. Avoid contact with your eyes, ears, mouth and genitals (private parts). Wash Face and genitals (private parts)  with your normal soap.   Wash thoroughly, paying special attention to the area where your surgery will be performed.  Thoroughly rinse your body with warm water from the neck down.  DO NOT shower/wash with your normal soap after using and rinsing off the CHG Soap.  Pat yourself dry with a CLEAN TOWEL.  Wear CLEAN PAJAMAS to bed the night before surgery  Place CLEAN SHEETS on your bed the night before your surgery  DO NOT SLEEP WITH PETS.   Day of Surgery: Take a shower with CHG soap. Wear Clean/Comfortable clothing the morning of surgery Do not apply any deodorants/lotions.   Remember to brush your teeth WITH YOUR REGULAR TOOTHPASTE.   Please read over the following fact sheets that you were given.

## 2021-10-04 ENCOUNTER — Other Ambulatory Visit: Payer: Self-pay

## 2021-10-04 ENCOUNTER — Encounter (HOSPITAL_COMMUNITY): Payer: Self-pay

## 2021-10-04 ENCOUNTER — Ambulatory Visit (HOSPITAL_BASED_OUTPATIENT_CLINIC_OR_DEPARTMENT_OTHER)
Admission: RE | Admit: 2021-10-04 | Discharge: 2021-10-04 | Disposition: A | Discharge status: Home / Self Care | Payer: BC Managed Care – PPO | Source: Ambulatory Visit | Attending: Surgery | Admitting: Surgery

## 2021-10-04 ENCOUNTER — Encounter (HOSPITAL_COMMUNITY)
Admission: RE | Admit: 2021-10-04 | Discharge: 2021-10-04 | Disposition: A | Discharge status: Home / Self Care | Payer: BC Managed Care – PPO | Source: Ambulatory Visit | Attending: Surgery | Admitting: Surgery

## 2021-10-04 VITALS — BP 109/72 | HR 77 | Temp 98.4°F | Resp 18 | Ht 69.0 in | Wt 203.4 lb

## 2021-10-04 DIAGNOSIS — Z7982 Long term (current) use of aspirin: Secondary | ICD-10-CM | POA: Diagnosis not present

## 2021-10-04 DIAGNOSIS — I5043 Acute on chronic combined systolic (congestive) and diastolic (congestive) heart failure: Secondary | ICD-10-CM | POA: Diagnosis not present

## 2021-10-04 DIAGNOSIS — Z880 Allergy status to penicillin: Secondary | ICD-10-CM | POA: Diagnosis not present

## 2021-10-04 DIAGNOSIS — R0600 Dyspnea, unspecified: Secondary | ICD-10-CM | POA: Insufficient documentation

## 2021-10-04 DIAGNOSIS — G621 Alcoholic polyneuropathy: Secondary | ICD-10-CM | POA: Diagnosis not present

## 2021-10-04 DIAGNOSIS — D696 Thrombocytopenia, unspecified: Secondary | ICD-10-CM | POA: Diagnosis not present

## 2021-10-04 DIAGNOSIS — E11319 Type 2 diabetes mellitus with unspecified diabetic retinopathy without macular edema: Secondary | ICD-10-CM | POA: Diagnosis not present

## 2021-10-04 DIAGNOSIS — I255 Ischemic cardiomyopathy: Secondary | ICD-10-CM | POA: Diagnosis not present

## 2021-10-04 DIAGNOSIS — I429 Cardiomyopathy, unspecified: Secondary | ICD-10-CM | POA: Insufficient documentation

## 2021-10-04 DIAGNOSIS — Z8249 Family history of ischemic heart disease and other diseases of the circulatory system: Secondary | ICD-10-CM | POA: Diagnosis not present

## 2021-10-04 DIAGNOSIS — I2510 Atherosclerotic heart disease of native coronary artery without angina pectoris: Secondary | ICD-10-CM

## 2021-10-04 DIAGNOSIS — Z7984 Long term (current) use of oral hypoglycemic drugs: Secondary | ICD-10-CM | POA: Diagnosis not present

## 2021-10-04 DIAGNOSIS — Z6829 Body mass index (BMI) 29.0-29.9, adult: Secondary | ICD-10-CM | POA: Diagnosis not present

## 2021-10-04 DIAGNOSIS — E1142 Type 2 diabetes mellitus with diabetic polyneuropathy: Secondary | ICD-10-CM | POA: Diagnosis not present

## 2021-10-04 DIAGNOSIS — Z888 Allergy status to other drugs, medicaments and biological substances status: Secondary | ICD-10-CM | POA: Diagnosis not present

## 2021-10-04 DIAGNOSIS — J9811 Atelectasis: Secondary | ICD-10-CM | POA: Diagnosis not present

## 2021-10-04 DIAGNOSIS — Z20822 Contact with and (suspected) exposure to covid-19: Secondary | ICD-10-CM | POA: Diagnosis not present

## 2021-10-04 DIAGNOSIS — I5020 Unspecified systolic (congestive) heart failure: Secondary | ICD-10-CM | POA: Insufficient documentation

## 2021-10-04 DIAGNOSIS — R531 Weakness: Secondary | ICD-10-CM | POA: Diagnosis not present

## 2021-10-04 DIAGNOSIS — D62 Acute posthemorrhagic anemia: Secondary | ICD-10-CM | POA: Diagnosis not present

## 2021-10-04 DIAGNOSIS — K5792 Diverticulitis of intestine, part unspecified, without perforation or abscess without bleeding: Secondary | ICD-10-CM | POA: Insufficient documentation

## 2021-10-04 DIAGNOSIS — I509 Heart failure, unspecified: Secondary | ICD-10-CM | POA: Diagnosis not present

## 2021-10-04 DIAGNOSIS — Z951 Presence of aortocoronary bypass graft: Secondary | ICD-10-CM | POA: Diagnosis not present

## 2021-10-04 DIAGNOSIS — E669 Obesity, unspecified: Secondary | ICD-10-CM | POA: Diagnosis not present

## 2021-10-04 DIAGNOSIS — I110 Hypertensive heart disease with heart failure: Secondary | ICD-10-CM | POA: Diagnosis not present

## 2021-10-04 DIAGNOSIS — Z79899 Other long term (current) drug therapy: Secondary | ICD-10-CM | POA: Diagnosis not present

## 2021-10-04 DIAGNOSIS — Z01818 Encounter for other preprocedural examination: Secondary | ICD-10-CM

## 2021-10-04 DIAGNOSIS — I340 Nonrheumatic mitral (valve) insufficiency: Secondary | ICD-10-CM | POA: Diagnosis not present

## 2021-10-04 DIAGNOSIS — E785 Hyperlipidemia, unspecified: Secondary | ICD-10-CM | POA: Diagnosis not present

## 2021-10-04 DIAGNOSIS — R1314 Dysphagia, pharyngoesophageal phase: Secondary | ICD-10-CM | POA: Insufficient documentation

## 2021-10-04 DIAGNOSIS — F1010 Alcohol abuse, uncomplicated: Secondary | ICD-10-CM | POA: Diagnosis not present

## 2021-10-04 DIAGNOSIS — J90 Pleural effusion, not elsewhere classified: Secondary | ICD-10-CM | POA: Insufficient documentation

## 2021-10-04 DIAGNOSIS — I5023 Acute on chronic systolic (congestive) heart failure: Secondary | ICD-10-CM | POA: Diagnosis not present

## 2021-10-04 DIAGNOSIS — I5022 Chronic systolic (congestive) heart failure: Secondary | ICD-10-CM | POA: Diagnosis not present

## 2021-10-04 DIAGNOSIS — N179 Acute kidney failure, unspecified: Secondary | ICD-10-CM | POA: Diagnosis not present

## 2021-10-04 DIAGNOSIS — I5021 Acute systolic (congestive) heart failure: Secondary | ICD-10-CM | POA: Diagnosis not present

## 2021-10-04 DIAGNOSIS — E7800 Pure hypercholesterolemia, unspecified: Secondary | ICD-10-CM | POA: Insufficient documentation

## 2021-10-04 HISTORY — DX: Family history of other specified conditions: Z84.89

## 2021-10-04 HISTORY — DX: Dyspnea, unspecified: R06.00

## 2021-10-04 HISTORY — DX: Cardiomyopathy, unspecified: I42.9

## 2021-10-04 HISTORY — DX: Atherosclerotic heart disease of native coronary artery without angina pectoris: I25.10

## 2021-10-04 HISTORY — DX: Heart failure, unspecified: I50.9

## 2021-10-04 LAB — CBC
HCT: 43 % (ref 39.0–52.0)
Hemoglobin: 14 g/dL (ref 13.0–17.0)
MCH: 29.9 pg (ref 26.0–34.0)
MCHC: 32.6 g/dL (ref 30.0–36.0)
MCV: 91.9 fL (ref 80.0–100.0)
Platelets: 177 10*3/uL (ref 150–400)
RBC: 4.68 MIL/uL (ref 4.22–5.81)
RDW: 13.2 % (ref 11.5–15.5)
WBC: 5.1 10*3/uL (ref 4.0–10.5)
nRBC: 0 % (ref 0.0–0.2)

## 2021-10-04 LAB — SURGICAL PCR SCREEN
MRSA, PCR: NEGATIVE
Staphylococcus aureus: NEGATIVE

## 2021-10-04 LAB — URINALYSIS, ROUTINE W REFLEX MICROSCOPIC
Bilirubin Urine: NEGATIVE
Glucose, UA: NEGATIVE mg/dL
Ketones, ur: NEGATIVE mg/dL
Leukocytes,Ua: NEGATIVE
Nitrite: NEGATIVE
Protein, ur: 100 mg/dL — AB
Specific Gravity, Urine: 1.025 (ref 1.005–1.030)
pH: 5.5 (ref 5.0–8.0)

## 2021-10-04 LAB — COMPREHENSIVE METABOLIC PANEL
ALT: 18 U/L (ref 0–44)
AST: 18 U/L (ref 15–41)
Albumin: 3.3 g/dL — ABNORMAL LOW (ref 3.5–5.0)
Alkaline Phosphatase: 59 U/L (ref 38–126)
Anion gap: 10 (ref 5–15)
BUN: 38 mg/dL — ABNORMAL HIGH (ref 6–20)
CO2: 24 mmol/L (ref 22–32)
Calcium: 9.2 mg/dL (ref 8.9–10.3)
Chloride: 103 mmol/L (ref 98–111)
Creatinine, Ser: 1.45 mg/dL — ABNORMAL HIGH (ref 0.61–1.24)
GFR, Estimated: 56 mL/min — ABNORMAL LOW (ref >60)
Glucose, Bld: 180 mg/dL — ABNORMAL HIGH (ref 70–99)
Potassium: 4.4 mmol/L (ref 3.5–5.1)
Sodium: 137 mmol/L (ref 135–145)
Total Bilirubin: 0.4 mg/dL (ref 0.3–1.2)
Total Protein: 6.1 g/dL — ABNORMAL LOW (ref 6.5–8.1)

## 2021-10-04 LAB — URINALYSIS, MICROSCOPIC (REFLEX): Bacteria, UA: NONE SEEN

## 2021-10-04 LAB — BLOOD GAS, ARTERIAL
Acid-base deficit: 1.5 mmol/L (ref 0.0–2.0)
Bicarbonate: 22.6 mmol/L (ref 20.0–28.0)
FIO2: 21
O2 Saturation: 97.1 %
Patient temperature: 37
pCO2 arterial: 37.3 mmHg (ref 32.0–48.0)
pH, Arterial: 7.4 (ref 7.350–7.450)
pO2, Arterial: 85.9 mmHg (ref 83.0–108.0)

## 2021-10-04 LAB — PROTIME-INR
INR: 1 (ref 0.8–1.2)
Prothrombin Time: 13 seconds (ref 11.4–15.2)

## 2021-10-04 LAB — APTT: aPTT: 27 seconds (ref 24–36)

## 2021-10-04 LAB — HEMOGLOBIN A1C
Hgb A1c MFr Bld: 6.6 % — ABNORMAL HIGH (ref 4.8–5.6)
Mean Plasma Glucose: 142.72 mg/dL

## 2021-10-04 LAB — SARS CORONAVIRUS 2 (TAT 6-24 HRS): SARS Coronavirus 2: NEGATIVE

## 2021-10-04 LAB — GLUCOSE, CAPILLARY: Glucose-Capillary: 245 mg/dL — ABNORMAL HIGH (ref 70–99)

## 2021-10-04 NOTE — Progress Notes (Signed)
Pre cabg has been completed.   Preliminary results in CV Proc.   Aundra Millet Rikki Smestad 10/04/2021 8:33 AM

## 2021-10-04 NOTE — Progress Notes (Signed)
PCP - Dr. [name] Cardiologist -   PPM/ICD - n/a Device Orders - n/a Rep Notified - n/a  Chest x-ray - 09/30/21 Within 2 weeks of surgery EKG - 09/29/21 Within 1 month of surgery Stress Test -  ECHO - 09/27/21 Cardiac Cath - 09/29/21  Sleep Study - denies CPAP - n/a  CBG today= 245 Patient states he rarely checks his blood sugar. Once a month if that. Patient states he does take his Metformin as prescribed.   Blood Thinner Instructions: n/a Aspirin Instructions: As of today stop taking Aspirin unless otherwise instructed by your surgeon.   ERAS Protcol - No. NPO after midnight   COVID TEST- 10/04/21. Pending.    Anesthesia review: Yes. Spoke to Hetty Ely, PA regarding elevated CBG today. Hetty Ely, PA stated he will review labs from today when resulted.   Patient denies shortness of breath, fever, cough and chest pain at PAT appointment   All instructions explained to the patient, with a verbal understanding of the material. Patient agrees to go over the instructions while at home for a better understanding. Patient also instructed to self quarantine after being tested for COVID-19. The opportunity to ask questions was provided.

## 2021-10-05 ENCOUNTER — Encounter (HOSPITAL_COMMUNITY): Payer: Self-pay

## 2021-10-05 DIAGNOSIS — I5021 Acute systolic (congestive) heart failure: Secondary | ICD-10-CM | POA: Diagnosis not present

## 2021-10-05 DIAGNOSIS — I2510 Atherosclerotic heart disease of native coronary artery without angina pectoris: Secondary | ICD-10-CM | POA: Diagnosis not present

## 2021-10-05 DIAGNOSIS — I255 Ischemic cardiomyopathy: Secondary | ICD-10-CM | POA: Diagnosis not present

## 2021-10-05 MED ORDER — MILRINONE LACTATE IN DEXTROSE 20-5 MG/100ML-% IV SOLN
0.3000 ug/kg/min | INTRAVENOUS | Status: AC
  Administered 2021-10-06: .25 ug/kg/min via INTRAVENOUS
  Filled 2021-10-05: qty 100

## 2021-10-05 MED ORDER — HEPARIN 30,000 UNITS/1000 ML (OHS) CELLSAVER SOLUTION
Status: DC
  Filled 2021-10-05: qty 1000

## 2021-10-05 MED ORDER — TRANEXAMIC ACID (OHS) BOLUS VIA INFUSION
15.0000 mg/kg | INTRAVENOUS | Status: AC
  Administered 2021-10-06: 1189.5 mg via INTRAVENOUS
  Filled 2021-10-05: qty 1190

## 2021-10-05 MED ORDER — NOREPINEPHRINE 4 MG/250ML-% IV SOLN
0.0000 ug/min | INTRAVENOUS | Status: DC
  Filled 2021-10-05: qty 250

## 2021-10-05 MED ORDER — POTASSIUM CHLORIDE 2 MEQ/ML IV SOLN
80.0000 meq | INTRAVENOUS | Status: DC
  Filled 2021-10-05: qty 40

## 2021-10-05 MED ORDER — CEFAZOLIN SODIUM-DEXTROSE 2-4 GM/100ML-% IV SOLN
2.0000 g | INTRAVENOUS | Status: AC
  Administered 2021-10-06: 2 g via INTRAVENOUS
  Filled 2021-10-05: qty 100

## 2021-10-05 MED ORDER — TRANEXAMIC ACID (OHS) PUMP PRIME SOLUTION
2.0000 mg/kg | INTRAVENOUS | Status: DC
  Filled 2021-10-05: qty 1.85

## 2021-10-05 MED ORDER — EPINEPHRINE HCL 5 MG/250ML IV SOLN IN NS
0.0000 ug/min | INTRAVENOUS | Status: DC
  Filled 2021-10-05: qty 250

## 2021-10-05 MED ORDER — PLASMA-LYTE A IV SOLN
INTRAVENOUS | Status: DC
  Filled 2021-10-05: qty 5

## 2021-10-05 MED ORDER — VANCOMYCIN HCL 1500 MG/300ML IV SOLN
1500.0000 mg | INTRAVENOUS | Status: AC
  Administered 2021-10-06: 1500 mg via INTRAVENOUS
  Filled 2021-10-05: qty 300

## 2021-10-05 MED ORDER — TRANEXAMIC ACID 1000 MG/10ML IV SOLN
1.5000 mg/kg/h | INTRAVENOUS | Status: AC
  Administered 2021-10-06: 1.5 mg/kg/h via INTRAVENOUS
  Filled 2021-10-05: qty 25

## 2021-10-05 MED ORDER — MANNITOL 20 % IV SOLN
INTRAVENOUS | Status: DC
  Filled 2021-10-05: qty 13

## 2021-10-05 MED ORDER — DEXMEDETOMIDINE HCL IN NACL 400 MCG/100ML IV SOLN
0.1000 ug/kg/h | INTRAVENOUS | Status: AC
  Administered 2021-10-06: .5 ug/kg/h via INTRAVENOUS
  Filled 2021-10-05: qty 100

## 2021-10-05 MED ORDER — PHENYLEPHRINE HCL-NACL 20-0.9 MG/250ML-% IV SOLN
30.0000 ug/min | INTRAVENOUS | Status: AC
  Administered 2021-10-06: 30 ug/min via INTRAVENOUS
  Filled 2021-10-05: qty 250

## 2021-10-05 MED ORDER — NITROGLYCERIN IN D5W 200-5 MCG/ML-% IV SOLN
2.0000 ug/min | INTRAVENOUS | Status: DC
  Filled 2021-10-05: qty 250

## 2021-10-05 MED ORDER — INSULIN REGULAR(HUMAN) IN NACL 100-0.9 UT/100ML-% IV SOLN
INTRAVENOUS | Status: AC
  Administered 2021-10-06: 4.2 [IU]/h via INTRAVENOUS
  Filled 2021-10-05: qty 100

## 2021-10-05 NOTE — H&P (Signed)
[address]       [name] [address]             [phone]      Cardiothoracic Surgery Admission History and Physical   Reason for Admission: Severe multi-vessel coronary artery disease with severe left ventricular systolic dysfunction Referring Physician: Dr. Thomes Cake III [name] is an 60 y.o. male.  HPI:    The patient is a 60 year old gentleman with a history of diabetes with peripheral neuropathy and retinopathy, hypertension, hyperlipidemia, and history of alcohol abuse who reports a 2-1/2-week history of substernal chest pain and shortness of breath that waxed and waned.  He said that it was fairly bad but he thought it would probably pass and may be related to the flu.  He then developed progressive lower extremity edema, orthopnea, and PND.  He called his PCP and was told to go to the emergency department.  D-dimer was 0.67.  He had a CTA of the chest which ruled out pulmonary embolism but did show moderate bilateral pleural effusions and mild vascular congestion.  High-sensitivity troponin was 55 and 56.  BNP was 1195.  He was negative for flu and COVID-19.  He was admitted for management of his acute heart failure.  2D echocardiogram on 09/27/2021 showed an ejection fraction of 30 to 35% with global hypokinesis and mild LVH.  There is grade 2 diastolic dysfunction.  Right ventricular systolic function was normal.  There was mild mitral regurgitation there is no aortic stenosis or insufficiency.  He underwent cardiac catheterization today showing severe multivessel coronary disease with ejection fraction of 25 to 35% by visual estimate.  Filling pressures were low and cardiac output was normal.   His family history is significant for his father having CABG at age 50 in the past 2 years.  The patient still works Electrical engineer.  He was a previous heavy alcohol user but reports minimal alcohol over the past few years.  He has never  smoked.         Past Medical History:  Diagnosis Date   Diabetes mellitus without complication (HCC)     Diabetic peripheral neuropathy (HCC)     Diabetic retinopathy (HCC)     Diverticulitis     Elevated LDL cholesterol level     Hypertension     Pharyngoesophageal dysphagia             Past Surgical History:  Procedure Laterality Date   EYE SURGERY       RIGHT/LEFT HEART CATH AND CORONARY ANGIOGRAPHY N/A 09/29/2021    Procedure: RIGHT/LEFT HEART CATH AND CORONARY ANGIOGRAPHY;  Surgeon: Dolores Patty, MD;  Location: [facility];  Service: Cardiovascular;  Laterality: N/A;   WISDOM TOOTH EXTRACTION               Family History  Problem Relation Age of Onset   Parkinson's disease Father     CAD Father     CAD Paternal Grandfather        Social History:  reports that he has never smoked. He has never used smokeless tobacco. He reports that he does not currently use alcohol. He reports that he does not use drugs.   Allergies:       Allergies  Allergen Reactions   Penicillin G Hives   Atorvastatin        Other reaction(s): fecal urgency   Crestor [Rosuvastatin]  Other reaction(s): GI cramping   Lipitor [Atorvastatin Calcium] Diarrhea   Ozempic (0.25 Or 0.5 Mg-Dose) [Semaglutide(0.25 Or 0.5mg -Dos)] Diarrhea      Medications: I have reviewed the patient's current medications. Prior to Admission:         Medications Prior to Admission  Medication Sig Dispense Refill Last Dose   bisacodyl (DULCOLAX) 5 MG EC tablet Take 5 mg by mouth daily as needed for moderate constipation.     Past Month   gabapentin (NEURONTIN) 300 MG capsule Take 300 mg by mouth at bedtime.     09/26/2021   metFORMIN (GLUCOPHAGE-XR) 750 MG 24 hr tablet Take 750 mg by mouth 2 (two) times daily.     09/26/2021   olmesartan (BENICAR) 20 MG tablet Take 20 mg by mouth daily.     09/26/2021   furosemide (LASIX) 20 MG tablet Take 20 mg by mouth daily. (Patient not taking: Reported on  09/27/2021)     Not Taking    Scheduled:  [START ON 09/30/2021] aspirin EC  81 mg Oral Daily   docusate sodium  100 mg Oral BID   [START ON 09/30/2021] enoxaparin (LOVENOX) injection  40 mg Subcutaneous Q24H   gabapentin  300 mg Oral QHS   insulin aspart  0-5 Units Subcutaneous QHS   insulin aspart  0-9 Units Subcutaneous TID WC   sacubitril-valsartan  1 tablet Oral BID   sodium chloride flush  3 mL Intravenous Q12H   sodium chloride flush  3 mL Intravenous Q12H   spironolactone  12.5 mg Oral Daily    Continuous:  sodium chloride 75 mL/hr at 09/29/21 1334   sodium chloride      OEV:OJJKKX chloride, acetaminophen, bisacodyl, hydrALAZINE, labetalol, morphine injection, ondansetron (ZOFRAN) IV, oxyCODONE, polyethylene glycol, sodium chloride flush Anti-infectives (From admission, onward)        None             Lab Results Last 48 Hours        Results for orders placed or performed during the hospital encounter of 09/26/21 (from the past 48 hour(s))  Basic metabolic panel     Status: Abnormal    Collection Time: 09/28/21  1:46 AM  Result Value Ref Range    Sodium 135 135 - 145 mmol/L    Potassium 4.5 3.5 - 5.1 mmol/L    Chloride 104 98 - 111 mmol/L    CO2 25 22 - 32 mmol/L    Glucose, Bld 124 (H) 70 - 99 mg/dL      Comment: Glucose reference range applies only to samples taken after fasting for at least 8 hours.    BUN 19 6 - 20 mg/dL    Creatinine, Ser 3.81 (H) 0.61 - 1.24 mg/dL    Calcium 9.1 8.9 - 82.9 mg/dL    GFR, Estimated >93 >71 mL/min      Comment: (NOTE) Calculated using the CKD-EPI Creatinine Equation (2021)      Anion gap 6 5 - 15      Comment: Performed at [facility], [address], [place], [place] [address]  CBC     Status: None    Collection Time: 09/28/21  1:46 AM  Result Value Ref Range    WBC 6.4 4.0 - 10.5 K/uL    RBC 4.74 4.22 - 5.81 MIL/uL    Hemoglobin 14.1 13.0 - 17.0 g/dL    HCT 93.8 10.1 - 75.1 %    MCV 89.2 80.0 - 100.0 fL  MCH 29.7 26.0 - 34.0 pg    MCHC 33.3 30.0 - 36.0 g/dL    RDW 16.1 09.6 - 04.5 %    Platelets 229 150 - 400 K/uL    nRBC 0.0 0.0 - 0.2 %      Comment: Performed at [facility], [address] [place], [place] [address]  Glucose, capillary     Status: Abnormal    Collection Time: 09/28/21  4:27 PM  Result Value Ref Range    Glucose-Capillary 196 (H) 70 - 99 mg/dL      Comment: Glucose reference range applies only to samples taken after fasting for at least 8 hours.  Glucose, capillary     Status: Abnormal    Collection Time: 09/28/21  9:17 PM  Result Value Ref Range    Glucose-Capillary 174 (H) 70 - 99 mg/dL      Comment: Glucose reference range applies only to samples taken after fasting for at least 8 hours.    Comment 1 Notify RN      Comment 2 Document in Chart    Basic metabolic panel     Status: Abnormal    Collection Time: 09/29/21  1:55 AM  Result Value Ref Range    Sodium 134 (L) 135 - 145 mmol/L    Potassium 3.8 3.5 - 5.1 mmol/L    Chloride 102 98 - 111 mmol/L    CO2 23 22 - 32 mmol/L    Glucose, Bld 143 (H) 70 - 99 mg/dL      Comment: Glucose reference range applies only to samples taken after fasting for at least 8 hours.    BUN 28 (H) 6 - 20 mg/dL    Creatinine, Ser 1.91 (H) 0.61 - 1.24 mg/dL    Calcium 8.9 8.9 - 47.8 mg/dL    GFR, Estimated 60 (L) >60 mL/min      Comment: (NOTE) Calculated using the CKD-EPI Creatinine Equation (2021)      Anion gap 9 5 - 15      Comment: Performed at [facility], [address] [place], [place] [address]  Magnesium     Status: None    Collection Time: 09/29/21  1:55 AM  Result Value Ref Range    Magnesium 1.9 1.7 - 2.4 mg/dL      Comment: Performed at [facility], [address] [place], [place] [address]  Glucose, capillary     Status: Abnormal    Collection Time: 09/29/21  6:05 AM  Result Value Ref Range    Glucose-Capillary 152 (H) 70 - 99 mg/dL      Comment: Glucose reference range applies only to  samples taken after fasting for at least 8 hours.    Comment 1 Notify RN      Comment 2 Document in Chart    Glucose, capillary     Status: Abnormal    Collection Time: 09/29/21 11:53 AM  Result Value Ref Range    Glucose-Capillary 158 (H) 70 - 99 mg/dL      Comment: Glucose reference range applies only to samples taken after fasting for at least 8 hours.         Imaging Results (Last 48 hours)  CARDIAC CATHETERIZATION   Result Date: 09/29/2021   Ost RCA to Prox RCA lesion is 90% stenosed.   Dist RCA lesion is 99% stenosed.   RPDA lesion is 100% stenosed.   RPAV lesion is 100% stenosed.   Ost Cx to  Prox Cx lesion is 80% stenosed.   1st Mrg lesion is 95% stenosed.   2nd Mrg lesion is 95% stenosed.   Prox LAD to Mid LAD lesion is 80% stenosed.   1st Diag lesion is 100% stenosed.   2nd Diag lesion is 99% stenosed.   Mid LAD to Dist LAD lesion is 75% stenosed.   The left ventricular ejection fraction is 25-35% by visual estimate. Findings: Ao = 101/71 (88) LV = 102/5 RA =  2 RV = 27/5 PA = 30/12 (19) PCW = 2 Fick cardiac output/index = 7.3/3.5 PVR = 2.2 WU SVR = 941 Ao sat = 95% PA sat = 72%, 73% SVC sat = 75% Assessment: 1. Severe 3v CAD 2. Low filling pressures with normal cardiac output 3. iCM EF ~25% 4. There was difficulty passing swan across pulmonary valve but no significant pulmonic stenosis (may be just angle of PA outflow tract) Plan/Discussion: Consult TCTS for CABG evaluation. Check cardiac MRI. Arvilla Meres, MD 11:58 AM      Review of Systems  Constitutional:  Positive for fatigue and unexpected weight change. Negative for chills and fever.  HENT: Negative.    Eyes: Negative.   Respiratory:  Positive for shortness of breath.   Cardiovascular:  Positive for chest pain and leg swelling.  Gastrointestinal:  Positive for abdominal distention.  Endocrine: Negative.   Genitourinary: Negative.   Musculoskeletal: Negative.   Skin: Negative.   Allergic/Immunologic: Negative.    Neurological:  Negative for dizziness and syncope.  Hematological: Negative.   Psychiatric/Behavioral: Negative.    Blood pressure 107/77, pulse 92, temperature 97.8 F (36.6 C), temperature source Oral, resp. rate 17, height  (1.753 m), weight 92.2 kg, SpO2 97 %. Physical Exam Constitutional:      Appearance: He is well-developed. He is obese.  HENT:     Head: Normocephalic and atraumatic.  Eyes:     Extraocular Movements: Extraocular movements intact.     Conjunctiva/sclera: Conjunctivae normal.     Pupils: Pupils are equal, round, and reactive to light.  Neck:     Vascular: No carotid bruit.  Cardiovascular:     Rate and Rhythm: Normal rate and regular rhythm.     Pulses: Normal pulses.     Heart sounds: Normal heart sounds. No murmur heard. Pulmonary:     Effort: Pulmonary effort is normal.     Breath sounds: Normal breath sounds.  Abdominal:     General: Abdomen is flat. Bowel sounds are normal.     Palpations: Abdomen is soft.  Musculoskeletal:        General: No deformity. Normal range of motion.     Cervical back: Normal range of motion and neck supple.     Comments: Has [name] on  Skin:    General: Skin is warm and dry.  Neurological:     General: No focal deficit present.     Mental Status: He is alert and oriented to person, place, and time.  Psychiatric:        Mood and Affect: Mood normal.        Behavior: Behavior normal.    ECHOCARDIOGRAM REPORT         Patient Name:   Ballard Russell Date of Exam: 09/27/2021  Medical Rec #:  [id number]         Height:       69.0 in  Accession #:    [id number]        Weight:  220.5 lb  Date of Birth:  Dec 03, 1960        BSA:          2.153 m  Patient Age:    59 years          BP:           158/95 mmHg  Patient Gender: M                 HR:           95 bpm.  Exam Location:  Inpatient   Procedure: 2D Echo   Indications:    congestive heart failure     History:        Patient has no prior history of  Echocardiogram  examinations.                  CHF; Risk Factors:Hypertension and Diabetes.     Sonographer:    Delcie Roch RDCS  Referring Phys: [id number] JENNIFER YATES   IMPRESSIONS     1. Left ventricular ejection fraction, by estimation, is 30 to 35%. The  left ventricle has moderately decreased function. The left ventricle  demonstrates global hypokinesis. There is mild left ventricular  hypertrophy. Left ventricular diastolic  parameters are consistent with Grade II diastolic dysfunction  (pseudonormalization). Elevated left atrial pressure.   2. Right ventricular systolic function is normal. The right ventricular  size is normal.   3. Left atrial size was mildly dilated.   4. A small pericardial effusion is present.   5. The mitral valve is normal in structure. Mild mitral valve  regurgitation. No evidence of mitral stenosis.   6. The aortic valve is tricuspid. Aortic valve regurgitation is not  visualized. Aortic valve sclerosis is present, with no evidence of aortic  valve stenosis.   7. The inferior vena cava is dilated in size with >50% respiratory  variability, suggesting right atrial pressure of 8 mmHg.   Comparison(s): No prior Echocardiogram.   FINDINGS   Left Ventricle: Left ventricular ejection fraction, by estimation, is 30  to 35%. The left ventricle has moderately decreased function. The left  ventricle demonstrates global hypokinesis. The left ventricular internal  cavity size was normal in size.  There is mild left ventricular hypertrophy. Left ventricular diastolic  parameters are consistent with Grade II diastolic dysfunction  (pseudonormalization). Elevated left atrial pressure.   Right Ventricle: The right ventricular size is normal. Right ventricular  systolic function is normal.   Left Atrium: Left atrial size was mildly dilated.   Right Atrium: Right atrial size was normal in size.   Pericardium: A small pericardial effusion is present.    Mitral Valve: The mitral valve is normal in structure. Mild mitral valve  regurgitation. No evidence of mitral valve stenosis.   Tricuspid Valve: The tricuspid valve is normal in structure. Tricuspid  valve regurgitation is trivial. No evidence of tricuspid stenosis.   Aortic Valve: The aortic valve is tricuspid. Aortic valve regurgitation is  not visualized. Aortic valve sclerosis is present, with no evidence of  aortic valve stenosis.   Pulmonic Valve: The pulmonic valve was normal in structure. Pulmonic valve  regurgitation is trivial. No evidence of pulmonic stenosis.   Aorta: The aortic root is normal in size and structure.   Venous: The inferior vena cava is dilated in size with greater than 50%  respiratory variability, suggesting right atrial pressure of 8 mmHg.   IAS/Shunts: No atrial level shunt detected by color flow Doppler.  LEFT VENTRICLE  PLAX 2D  LVIDd:         4.90 cm      Diastology  LVIDs:         4.10 cm      LV e' medial:    3.70 cm/s  LV PW:         1.30 cm      LV E/e' medial:  30.0  LV IVS:        1.10 cm      LV e' lateral:   10.60 cm/s  LVOT diam:     2.10 cm      LV E/e' lateral: 10.5  LV SV:         48  LV SV Index:   22  LVOT Area:     3.46 cm     LV Volumes (MOD)  LV vol d, MOD A4C: 116.0 ml  LV vol s, MOD A4C: 79.1 ml  LV SV MOD A4C:     116.0 ml   RIGHT VENTRICLE             IVC  RV S prime:     11.90 cm/s  IVC diam: 2.30 cm  TAPSE (M-mode): 1.7 cm   LEFT ATRIUM             Index        RIGHT ATRIUM           Index  LA diam:        4.90 cm 2.28 cm/m   RA Area:     15.30 cm  LA Vol (A2C):   75.9 ml 35.25 ml/m  RA Volume:   43.90 ml  20.39 ml/m  LA Vol (A4C):   89.2 ml 41.42 ml/m  LA Biplane Vol: 87.6 ml 40.68 ml/m   AORTIC VALVE  LVOT Vmax:   71.80 cm/s  LVOT Vmean:  50.400 cm/s  LVOT VTI:    0.138 m     AORTA  Ao Root diam: 3.20 cm  Ao Asc diam:  3.40 cm   MITRAL VALVE  MV Area (PHT): 5.27 cm     SHUNTS  MV Decel  Time: 144 msec     Systemic VTI:  0.14 m  MV E velocity: 111.00 cm/s  Systemic Diam: 2.10 cm  MV A velocity: 66.80 cm/s  MV E/A ratio:  1.66   Olga Millers MD  Electronically signed by Olga Millers MD  Signature Date/Time: 09/27/2021/2:51:07 PM         Final       Physicians   Panel Physicians Referring Physician Case Authorizing Physician  Bensimhon, Bevelyn Buckles, MD (Primary)        Procedures   RIGHT/LEFT HEART CATH AND CORONARY ANGIOGRAPHY    Conclusion       Ost RCA to Prox RCA lesion is 90% stenosed.   Dist RCA lesion is 99% stenosed.   RPDA lesion is 100% stenosed.   RPAV lesion is 100% stenosed.   Ost Cx to Prox Cx lesion is 80% stenosed.   1st Mrg lesion is 95% stenosed.   2nd Mrg lesion is 95% stenosed.   Prox LAD to Mid LAD lesion is 80% stenosed.   1st Diag lesion is 100% stenosed.   2nd Diag lesion is 99% stenosed.   Mid LAD to Dist LAD lesion is 75% stenosed.   The left ventricular ejection fraction is 25-35% by visual estimate.   Findings:   Ao = 101/71 (88) LV =  102/5 RA =  2 RV = 27/5 PA = 30/12 (19) PCW = 2 Fick cardiac output/index = 7.3/3.5 PVR = 2.2 WU SVR = 941 Ao sat = 95% PA sat = 72%, 73% SVC sat = 75%   Assessment: 1. Severe 3v CAD 2. Low filling pressures with normal cardiac output 3. iCM EF ~25% 4. There was difficulty passing swan across pulmonary valve but no significant pulmonic stenosis (may be just angle of PA outflow tract)   Plan/Discussion:   Consult TCTS for CABG evaluation. Check cardiac MRI.    Arvilla Meres, MD  11:58 AM     Indications   Acute systolic heart failure (HCC) [I50.21 (ICD-10-CM)]    Procedural Details   Technical Details The risks and indication of the procedure were explained. Consent was signed and placed on the chart. An appropriate timeout was taken prior to the procedure.   The right AC fossa was prepped and draped in the routine sterile fashion and anesthetized with 1% local  lidocaine. The pre-existing PIV in the right [facility] was exchanged over a wire for a 5 FR venous sheath using a modified Seldinger technique. A standard Swan-Ganz catheter was used for the procedure. We had significant difficulty getting the swan across the pulmonic valve. The wire went easily but the angle made it difficult to advance the catheter. No evidence of pulmonic stenosis.   After a normal Allen's test was confirmed, the right wrist was prepped and draped in the routine sterile fashion and anesthetized with 1% local lidocaine. A 5 FR arterial sheath was then placed in the right radial artery using a modified Seldinger technique. 3mg  IV verapamil was given through the sheath. Systemic heparin was administered.  Standard catheters including a JL 3.5 and a JR 4 were used. All catheter exchanges were made over a wire.   Estimated blood loss <50 mL.   During this procedure medications were administered to achieve and maintain moderate conscious sedation while the patient's heart rate, blood pressure, and oxygen saturation were continuously monitored and I was present face-to-face 100% of this time.    Medications (Filter: Administrations occurring from 1011 to 1111 on 09/29/21)  important  Continuous medications are totaled by the amount administered until 09/29/21 1111.    midazolam (VERSED) injection (mg) Total dose:  2 mg Date/Time Rate/Dose/Volume Action    09/29/21 1026 2 mg Given      fentaNYL (SUBLIMAZE) injection (mcg) Total dose:  25 mcg Date/Time Rate/Dose/Volume Action    09/29/21 1026 25 mcg Given      lidocaine (PF) (XYLOCAINE) 1 % injection (mL) Total volume:  4 mL Date/Time Rate/Dose/Volume Action    09/29/21 1033 2 mL Given    1033 2 mL Given      Radial Cocktail/Verapamil only (mL) Total volume:  10 mL Date/Time Rate/Dose/Volume Action    09/29/21 1037 10 mL Given      heparin sodium (porcine) injection (Units) Total dose:  4,500 Units Date/Time Rate/Dose/Volume  Action    09/29/21 1049 4,500 Units Given      Heparin (Porcine) in NaCl 1000-0.9 UT/500ML-% SOLN (mL) Total volume:  1,000 mL Date/Time Rate/Dose/Volume Action    09/29/21 1052 500 mL Given    1052 500 mL Given      iohexol (OMNIPAQUE) 350 MG/ML injection (mL) Total volume:  70 mL Date/Time Rate/Dose/Volume Action    09/29/21 1104 70 mL Given      bisacodyl (DULCOLAX) EC tablet 5 mg (mg) Total dose:  Cannot be  calculated* *Administration dose not documented Date/Time Rate/Dose/Volume Action    09/29/21 1015 *Not included in total MAR Hold      docusate sodium (COLACE) capsule 100 mg (mg) Total dose:  Cannot be calculated* Dosing weight:  100 *Administration dose not documented Date/Time Rate/Dose/Volume Action    09/29/21 1015 *Not included in total MAR Hold      gabapentin (NEURONTIN) capsule 300 mg (mg) Total dose:  Cannot be calculated* *Administration dose not documented Date/Time Rate/Dose/Volume Action    09/29/21 1015 *Not included in total MAR Hold      insulin aspart (novoLOG) injection 0-5 Units (Units) Total dose:  Cannot be calculated* Dosing weight:  95.3 *Administration dose not documented Date/Time Rate/Dose/Volume Action    09/29/21 1015 *Not included in total MAR Hold      insulin aspart (novoLOG) injection 0-9 Units (Units) Total dose:  Cannot be calculated* Dosing weight:  95.3 *Administration dose not documented Date/Time Rate/Dose/Volume Action    09/29/21 1015 *Not included in total MAR Hold      morphine 2 MG/ML injection 2 mg (mg) Total dose:  Cannot be calculated* Dosing weight:  100 *Administration dose not documented Date/Time Rate/Dose/Volume Action    09/29/21 1015 *Not included in total MAR Hold      oxyCODONE (Oxy IR/ROXICODONE) immediate release tablet 5 mg (mg) Total dose:  Cannot be calculated* Dosing weight:  100 *Administration dose not documented Date/Time Rate/Dose/Volume Action    09/29/21 1015 *Not included in total MAR  Hold      polyethylene glycol (MIRALAX / GLYCOLAX) packet 17 g (g) Total dose:  Cannot be calculated* Dosing weight:  100 *Administration dose not documented Date/Time Rate/Dose/Volume Action    09/29/21 1015 *Not included in total MAR Hold      sacubitril-valsartan (ENTRESTO) 24-26 mg per tablet (tablet) Total dose:  Cannot be calculated* Dosing weight:  100 *Administration dose not documented Date/Time Rate/Dose/Volume Action    09/29/21 1015 *Not included in total MAR Hold      sodium chloride flush (NS) 0.9 % injection 3 mL (mL) Total dose:  Cannot be calculated* Dosing weight:  100 *Administration dose not documented Date/Time Rate/Dose/Volume Action    09/29/21 1015 *Not included in total MAR Hold      spironolactone (ALDACTONE) tablet 12.5 mg (mg) Total dose:  Cannot be calculated* Dosing weight:  95.3 *Administration dose not documented Date/Time Rate/Dose/Volume Action    09/29/21 1015 *Not included in total MAR Hold      enoxaparin (LOVENOX) injection 40 mg (mg) Total dose:  Cannot be calculated* Dosing weight:  100 *Administration dose not documented Date/Time Rate/Dose/Volume Action    09/29/21 1015 *Not included in total MAR Hold      hydrALAZINE (APRESOLINE) injection 5 mg (mg) Total dose:  Cannot be calculated* Dosing weight:  100 *Administration dose not documented Date/Time Rate/Dose/Volume Action    09/29/21 1015 *Not included in total MAR Hold      Sedation Time   Sedation Time Physician-1: 36 minutes 28 seconds Contrast   Medication Name Total Dose  iohexol (OMNIPAQUE) 350 MG/ML injection 70 mL    Radiation/Fluoro   Fluoro time: 12.2 (min) DAP: 71165 (mGycm2) Cumulative Air Kerma: 1261 (mGy) Coronary Findings   Diagnostic Dominance: Right Left Anterior Descending  Prox LAD to Mid LAD lesion is 80% stenosed.  Mid LAD to Dist LAD lesion is 75% stenosed.    First Diagonal Branch  Collaterals  1st Diag filled by collaterals from Dist LAD.  1st Diag lesion is 100% stenosed.    Second Diagonal Branch  2nd Diag lesion is 99% stenosed.    Left Circumflex  Ost Cx to Prox Cx lesion is 80% stenosed.    First Obtuse Marginal Branch  1st Mrg lesion is 95% stenosed.    Second Obtuse Marginal Branch  2nd Mrg lesion is 95% stenosed.    Right Coronary Artery  Ost RCA to Prox RCA lesion is 90% stenosed.  Dist RCA lesion is 99% stenosed.    Right Posterior Descending Artery  RPDA lesion is 100% stenosed.    Right Posterior Atrioventricular Artery  RPAV lesion is 100% stenosed.    Third Right Posterolateral Branch  Collaterals  3rd RPL filled by collaterals from 2nd Sept.       Intervention    No interventions have been documented.    Left Heart   Left Ventricle The left ventricular ejection fraction is 25-35% by visual estimate.    Coronary Diagrams   Diagnostic Dominance: Right Intervention   Implants      No implant documentation for this case.    Syngo Images    Show images for CARDIAC CATHETERIZATION Images on Long Term Storage    Show images for [place], Cam[place] III Link to Procedure Log   Procedure Log    Hemo Data   Flowsheet Row Most Recent Value  Fick Cardiac Output 7.31 L/min  Fick Cardiac Output Index 3.47 (L/min)/BSA  RA A Wave 6 mmHg  RA V Wave 3 mmHg  RA Mean 2 mmHg  RV Systolic Pressure 27 mmHg  RV Diastolic Pressure 0 mmHg  RV EDP 5 mmHg  PA Systolic Pressure 30 mmHg  PA Diastolic Pressure 12 mmHg  PA Mean 19 mmHg  PW A Wave 5 mmHg  PW V Wave 6 mmHg  PW Mean 2 mmHg  AO Systolic Pressure 101 mmHg  AO Diastolic Pressure 71 mmHg  AO Mean 88 mmHg  LV Systolic Pressure 102 mmHg  LV Diastolic Pressure -2 mmHg  LV EDP 5 mmHg  AOp Systolic Pressure 91 mmHg  AOp Diastolic Pressure 66 mmHg  LVp Systolic Pressure 92 mmHg  LVp Diastolic Pressure 2 mmHg  LVp EDP Pressure 8 mmHg  QP/QS 0.87  TPVR Index 5.47 HRUI  TSVR Index 25.36 HRUI  PVR SVR Ratio 0.2  TPVR/TSVR Ratio  0.22        Assessment/Plan:   This 60 year old gentleman has 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  I think that coronary artery bypass graft surgery is the best treatment for relief of his symptoms and to prevent further myocardial loss and worsening heart failure. I discussed the operative procedure with the patient and his sister including alternatives, benefits and risks; including but not limited to bleeding, blood transfusion, infection, stroke, myocardial infarction, graft failure, heart block requiring a permanent pacemaker, organ dysfunction, and death.  Ballard Russell understands and agrees to proceed.      [name]

## 2021-10-05 NOTE — Anesthesia Preprocedure Evaluation (Addendum)
Anesthesia Evaluation  Patient identified by MRN, date of birth, ID band Patient awake    Reviewed: Allergy & Precautions, H&P , NPO status , Patient's Chart, lab work & pertinent test results  Airway Mallampati: II   Neck ROM: full    Dental   Pulmonary shortness of breath,    breath sounds clear to auscultation       Cardiovascular hypertension, + CAD and +CHF   Rhythm:regular Rate:Normal  EF 35%   Neuro/Psych  Neuromuscular disease    GI/Hepatic   Endo/Other  diabetes, Type 2  Renal/GU      Musculoskeletal   Abdominal   Peds  Hematology   Anesthesia Other Findings   Reproductive/Obstetrics                            Anesthesia Physical Anesthesia Plan  ASA: 3  Anesthesia Plan: General   Post-op Pain Management:    Induction: Intravenous  PONV Risk Score and Plan: 2 and Ondansetron, Dexamethasone, Midazolam and Treatment may vary due to age or medical condition  Airway Management Planned: Oral ETT  Additional Equipment: Arterial line, CVP, PA Cath, TEE and Ultrasound Guidance Line Placement  Intra-op Plan:   Post-operative Plan: Post-operative intubation/ventilation  Informed Consent: I have reviewed the patients History and Physical, chart, labs and discussed the procedure including the risks, benefits and alternatives for the proposed anesthesia with the patient or authorized representative who has indicated his/her understanding and acceptance.     Dental advisory given  Plan Discussed with: CRNA, Anesthesiologist and Surgeon  Anesthesia Plan Comments: (PAT note written 10/05/2021 by Shonna Chock, PA-C. )       Anesthesia Quick Evaluation

## 2021-10-05 NOTE — Progress Notes (Signed)
Anesthesia Chart Review:  Case: [id number] Date/Time: 10/06/21 0715   Procedures:      CORONARY ARTERY BYPASS GRAFTING (CABG) (Chest)     TRANSESOPHAGEAL ECHOCARDIOGRAM (TEE)   Anesthesia type: General   Pre-op diagnosis: CAD   [facility] / [facility]   Surgeons: [name], MD       DISCUSSION: Patient is a 60 year old male scheduled for the above procedure.   History includes never smoker, CAD, cardiomyopathy (likely ischemic  based on 09/29/21 LHC), systolic CHF (acute 09/26/21), HTN, hypercholesterolemia, DM2 (with neuropathy, retinopathy), dyspnea, pharyngoesophageal dysphagia, diverticulitis. For family history of anesthesia reactions, he reported his father was agitated and had to be restrained. Cardiology notes indicate that he does have a history of heavy alcohol intake, but with minimal intake in the last few years.   Brett Walker admission 09/26/21-09/30/21 for worsening dyspnea x 2 weeks. CTA negative for PE, but showed large pleural effusions. Admitted for presumed new onset CHF, and echocardiogram confirmed systolic CHF with EF 30-35%. Effusions and dyspnea improved with diuresis. HF Cardiology consulted, and he underwent a cardiac cath which showed severe three-vessel CAD.  CT surgery was consulted and recommended CABG. He was cleared for discharge with plans to return for CABG 10/06/21. Eventually sleep study is also planned for snoring history.   10/04/2021 presurgical COVID-19 test negative.  Anesthesia team to evaluate on the day of surgery.   VS: BP 109/72    Pulse 77    Temp 36.9 C (Oral)    Resp 18    Ht 5\' 9"  (1.753 m)    Wt 92.3 kg    SpO2 100%    BMI 30.04 kg/m    PROVIDERS: , MD is PCP  [name], MD is HF cardiologist   LABS: Labs reviewed: Acceptable for surgery. Cr 1.45, previously 1.21-1.38 last week. (all labs ordered are listed, but only abnormal results are displayed)  Labs Reviewed  GLUCOSE, CAPILLARY - Abnormal; Notable  for the following components:      Result Value   Glucose-Capillary 245 (*)    All other components within normal limits  COMPREHENSIVE METABOLIC PANEL - Abnormal; Notable for the following components:   Glucose, Bld 180 (*)    BUN 38 (*)    Creatinine, Ser 1.45 (*)    Total Protein 6.1 (*)    Albumin 3.3 (*)    GFR, Estimated 56 (*)    All other components within normal limits  URINALYSIS, ROUTINE W REFLEX MICROSCOPIC - Abnormal; Notable for the following components:   Hgb urine dipstick TRACE (*)    Protein, ur 100 (*)    All other components within normal limits  BLOOD GAS, ARTERIAL - Abnormal; Notable for the following components:   Allens test (pass/fail) BRACHIAL ARTERY (*)    All other components within normal limits  HEMOGLOBIN A1C - Abnormal; Notable for the following components:   Hgb A1c MFr Bld 6.6 (*)    All other components within normal limits  SURGICAL PCR SCREEN  SARS CORONAVIRUS 2 (TAT 6-24 HRS)  CBC  PROTIME-INR  APTT  URINALYSIS, MICROSCOPIC (REFLEX)  TYPE AND SCREEN     IMAGES: CXR 09/30/21: FINDINGS: Improvement in bibasilar airspace disease. Small bilateral pleural effusions are present. Normal pulmonary vascularity. Upper lobes remain clear. IMPRESSION: Decreased bibasilar atelectasis.  Small bilateral pleural effusions.   CTA Chest 09/26/21: IMPRESSION: No evidence pulmonary embolism. Large bilateral pleural effusions with compensatory lower lobe atelectatic changes.  EKG: 09/29/21: Normal sinus rhythm Minimal voltage criteria for LVH, may be normal variant ( Sokolow-Lyon ) Cannot rule out Inferior infarct , age undetermined ST & T wave abnormality, consider lateral ischemia Prolonged QT Abnormal ECG No significant change since last tracing Confirmed by Zoila Shutter [phone]) on 09/29/2021 4:46:16 PM   CV: US Carotid 10/04/21: Summary:  Right Carotid: The extracranial vessels were near-normal with only minimal wall thickening or  plaque.  Left Carotid: The extracranial vessels were near-normal with only minimal wall thickening or plaque.  Vertebrals: Bilateral vertebral arteries demonstrate antegrade flow.    RHC/LHC 09/29/21:   Ost RCA to Prox RCA lesion is 90% stenosed.   Dist RCA lesion is 99% stenosed.   RPDA lesion is 100% stenosed.   RPAV lesion is 100% stenosed.   Ost Cx to Prox Cx lesion is 80% stenosed.   1st Mrg lesion is 95% stenosed.   2nd Mrg lesion is 95% stenosed.   Prox LAD to Mid LAD lesion is 80% stenosed.   1st Diag lesion is 100% stenosed.   2nd Diag lesion is 99% stenosed.   Mid LAD to Dist LAD lesion is 75% stenosed.   The left ventricular ejection fraction is 25-35% by visual estimate.   Findings:   Ao = 101/71 (88) LV = 102/5 RA =  2 RV = 27/5 PA = 30/12 (19) PCW = 2 Fick cardiac output/index = 7.3/3.5 PVR = 2.2 WU SVR = 941 Ao sat = 95% PA sat = 72%, 73% SVC sat = 75%   Assessment: 1. Severe 3v CAD 2. Low filling pressures with normal cardiac output 3. iCM EF ~25% 4. There was difficulty passing swan across pulmonary valve but no significant pulmonic stenosis (may be just angle of PA outflow tract)   Plan/Discussion: Consult TCTS for CABG evaluation. Check cardiac MRI.  - Per Dr. Gala Romney, cMRI had been planeed if no obstructive CAD was seen on LHC. Cath showed severe 3V CAD. Cardiac MRI was not done.    Echo 09/27/21: IMPRESSIONS   1. Left ventricular ejection fraction, by estimation, is 30 to 35%. The  left ventricle has moderately decreased function. The left ventricle  demonstrates global hypokinesis. There is mild left ventricular  hypertrophy. Left ventricular diastolic  parameters are consistent with Grade II diastolic dysfunction  (pseudonormalization). Elevated left atrial pressure.   2. Right ventricular systolic function is normal. The right ventricular  size is normal.   3. Left atrial size was mildly dilated.   4. A small pericardial effusion is  present.   5. The mitral valve is normal in structure. Mild mitral valve  regurgitation. No evidence of mitral stenosis.   6. The aortic valve is tricuspid. Aortic valve regurgitation is not  visualized. Aortic valve sclerosis is present, with no evidence of aortic  valve stenosis.   7. The inferior vena cava is dilated in size with >50% respiratory  variability, suggesting right atrial pressure of 8 mmHg.  - Comparison(s): No prior Echocardiogram.    BLE Venous US 09/26/21: IMPRESSION: Negative.  Past Medical History:  Diagnosis Date   Cardiomyopathy (HCC)    CHF (congestive heart failure) (HCC)    Coronary artery disease    Diabetes mellitus without complication (HCC)    Diabetic peripheral neuropathy (HCC)    Diabetic retinopathy (HCC)    Diverticulitis    Dyspnea    Elevated LDL cholesterol level    Family history of adverse reaction to anesthesia    Patient states his  dad "woke up crazy from anesthesia and had to be restrained"   Hypertension    Pharyngoesophageal dysphagia     Past Surgical History:  Procedure Laterality Date   EYE SURGERY     RIGHT/LEFT HEART CATH AND CORONARY ANGIOGRAPHY N/A 09/29/2021   Procedure: RIGHT/LEFT HEART CATH AND CORONARY ANGIOGRAPHY;  Surgeon: Dolores Patty, MD;  Location: [facility];  Service: Cardiovascular;  Laterality: N/A;   WISDOM TOOTH EXTRACTION      MEDICATIONS:  aspirin EC 325 MG tablet   aspirin EC 81 MG EC tablet   carvedilol (COREG) 3.125 MG tablet   furosemide (LASIX) 20 MG tablet   gabapentin (NEURONTIN) 300 MG capsule   metFORMIN (GLUCOPHAGE-XR) 750 MG 24 hr tablet   OVER THE COUNTER MEDICATION   pravastatin (PRAVACHOL) 40 MG tablet   sacubitril-valsartan (ENTRESTO) 24-26 MG   spironolactone (ALDACTONE) 25 MG tablet   No current facility-administered medications for this encounter.    Shonna Chock, PA-C Surgical Short Stay/Anesthesiology [facility] Phone [phone] [facility] Phone [phone] 10/05/2021 11:10 AM

## 2021-10-06 ENCOUNTER — Inpatient Hospital Stay (HOSPITAL_COMMUNITY): Payer: BC Managed Care – PPO | Admitting: Vascular Surgery

## 2021-10-06 ENCOUNTER — Inpatient Hospital Stay (HOSPITAL_COMMUNITY): Payer: BC Managed Care – PPO

## 2021-10-06 ENCOUNTER — Inpatient Hospital Stay (HOSPITAL_COMMUNITY)
Admission: RE | Admit: 2021-10-06 | Discharge: 2021-10-10 | DRG: 235 | Disposition: A | Discharge status: Home / Self Care | Payer: BC Managed Care – PPO | Attending: Surgery | Admitting: Surgery

## 2021-10-06 ENCOUNTER — Inpatient Hospital Stay (HOSPITAL_COMMUNITY): Payer: BC Managed Care – PPO | Admitting: Registered Nurse

## 2021-10-06 ENCOUNTER — Other Ambulatory Visit: Payer: Self-pay

## 2021-10-06 ENCOUNTER — Inpatient Hospital Stay (HOSPITAL_COMMUNITY)
Admission: RE | Disposition: A | Discharge status: Home / Self Care | Payer: Self-pay | Source: Home / Self Care | Attending: Surgery

## 2021-10-06 ENCOUNTER — Encounter (HOSPITAL_COMMUNITY): Payer: Self-pay | Admitting: Surgery

## 2021-10-06 DIAGNOSIS — G621 Alcoholic polyneuropathy: Secondary | ICD-10-CM | POA: Diagnosis present

## 2021-10-06 DIAGNOSIS — I5023 Acute on chronic systolic (congestive) heart failure: Secondary | ICD-10-CM | POA: Diagnosis not present

## 2021-10-06 DIAGNOSIS — E11319 Type 2 diabetes mellitus with unspecified diabetic retinopathy without macular edema: Secondary | ICD-10-CM | POA: Diagnosis present

## 2021-10-06 DIAGNOSIS — Z7982 Long term (current) use of aspirin: Secondary | ICD-10-CM

## 2021-10-06 DIAGNOSIS — Z79899 Other long term (current) drug therapy: Secondary | ICD-10-CM | POA: Diagnosis not present

## 2021-10-06 DIAGNOSIS — Z888 Allergy status to other drugs, medicaments and biological substances status: Secondary | ICD-10-CM | POA: Diagnosis not present

## 2021-10-06 DIAGNOSIS — Z951 Presence of aortocoronary bypass graft: Secondary | ICD-10-CM | POA: Diagnosis not present

## 2021-10-06 DIAGNOSIS — I255 Ischemic cardiomyopathy: Secondary | ICD-10-CM | POA: Diagnosis present

## 2021-10-06 DIAGNOSIS — I2510 Atherosclerotic heart disease of native coronary artery without angina pectoris: Principal | ICD-10-CM

## 2021-10-06 DIAGNOSIS — E669 Obesity, unspecified: Secondary | ICD-10-CM | POA: Diagnosis present

## 2021-10-06 DIAGNOSIS — I5043 Acute on chronic combined systolic (congestive) and diastolic (congestive) heart failure: Secondary | ICD-10-CM | POA: Diagnosis not present

## 2021-10-06 DIAGNOSIS — I5022 Chronic systolic (congestive) heart failure: Secondary | ICD-10-CM | POA: Diagnosis not present

## 2021-10-06 DIAGNOSIS — Z880 Allergy status to penicillin: Secondary | ICD-10-CM | POA: Diagnosis not present

## 2021-10-06 DIAGNOSIS — Z20822 Contact with and (suspected) exposure to covid-19: Secondary | ICD-10-CM | POA: Diagnosis not present

## 2021-10-06 DIAGNOSIS — N179 Acute kidney failure, unspecified: Secondary | ICD-10-CM | POA: Diagnosis not present

## 2021-10-06 DIAGNOSIS — Z6829 Body mass index (BMI) 29.0-29.9, adult: Secondary | ICD-10-CM

## 2021-10-06 DIAGNOSIS — E1142 Type 2 diabetes mellitus with diabetic polyneuropathy: Secondary | ICD-10-CM | POA: Diagnosis not present

## 2021-10-06 DIAGNOSIS — E785 Hyperlipidemia, unspecified: Secondary | ICD-10-CM | POA: Diagnosis present

## 2021-10-06 DIAGNOSIS — J90 Pleural effusion, not elsewhere classified: Secondary | ICD-10-CM

## 2021-10-06 DIAGNOSIS — I110 Hypertensive heart disease with heart failure: Secondary | ICD-10-CM | POA: Diagnosis present

## 2021-10-06 DIAGNOSIS — F1010 Alcohol abuse, uncomplicated: Secondary | ICD-10-CM | POA: Diagnosis not present

## 2021-10-06 DIAGNOSIS — D696 Thrombocytopenia, unspecified: Secondary | ICD-10-CM | POA: Diagnosis not present

## 2021-10-06 DIAGNOSIS — Z7984 Long term (current) use of oral hypoglycemic drugs: Secondary | ICD-10-CM | POA: Diagnosis not present

## 2021-10-06 DIAGNOSIS — Z8249 Family history of ischemic heart disease and other diseases of the circulatory system: Secondary | ICD-10-CM | POA: Diagnosis not present

## 2021-10-06 DIAGNOSIS — D62 Acute posthemorrhagic anemia: Secondary | ICD-10-CM | POA: Diagnosis not present

## 2021-10-06 DIAGNOSIS — Z09 Encounter for follow-up examination after completed treatment for conditions other than malignant neoplasm: Secondary | ICD-10-CM

## 2021-10-06 HISTORY — PX: TEE WITHOUT CARDIOVERSION: SHX5443

## 2021-10-06 HISTORY — PX: ENDOVEIN HARVEST OF GREATER SAPHENOUS VEIN: SHX5059

## 2021-10-06 HISTORY — PX: CORONARY ARTERY BYPASS GRAFT: SHX141

## 2021-10-06 LAB — POCT I-STAT, CHEM 8
BUN: 30 mg/dL — ABNORMAL HIGH (ref 6–20)
BUN: 27 mg/dL — ABNORMAL HIGH (ref 6–20)
BUN: 29 mg/dL — ABNORMAL HIGH (ref 6–20)
BUN: 27 mg/dL — ABNORMAL HIGH (ref 6–20)
BUN: 27 mg/dL — ABNORMAL HIGH (ref 6–20)
Calcium, Ion: 1.23 mmol/L (ref 1.15–1.40)
Calcium, Ion: 1.08 mmol/L — ABNORMAL LOW (ref 1.15–1.40)
Calcium, Ion: 1.24 mmol/L (ref 1.15–1.40)
Calcium, Ion: 1.04 mmol/L — ABNORMAL LOW (ref 1.15–1.40)
Calcium, Ion: 1.11 mmol/L — ABNORMAL LOW (ref 1.15–1.40)
Chloride: 107 mmol/L (ref 98–111)
Chloride: 104 mmol/L (ref 98–111)
Chloride: 107 mmol/L (ref 98–111)
Chloride: 104 mmol/L (ref 98–111)
Chloride: 105 mmol/L (ref 98–111)
Creatinine, Ser: 1.1 mg/dL (ref 0.61–1.24)
Creatinine, Ser: 1.1 mg/dL (ref 0.61–1.24)
Creatinine, Ser: 1.1 mg/dL (ref 0.61–1.24)
Creatinine, Ser: 1 mg/dL (ref 0.61–1.24)
Creatinine, Ser: 1 mg/dL (ref 0.61–1.24)
Glucose, Bld: 159 mg/dL — ABNORMAL HIGH (ref 70–99)
Glucose, Bld: 140 mg/dL — ABNORMAL HIGH (ref 70–99)
Glucose, Bld: 99 mg/dL (ref 70–99)
Glucose, Bld: 135 mg/dL — ABNORMAL HIGH (ref 70–99)
Glucose, Bld: 154 mg/dL — ABNORMAL HIGH (ref 70–99)
HCT: 35 % — ABNORMAL LOW (ref 39.0–52.0)
HCT: 24 % — ABNORMAL LOW (ref 39.0–52.0)
HCT: 31 % — ABNORMAL LOW (ref 39.0–52.0)
HCT: 25 % — ABNORMAL LOW (ref 39.0–52.0)
HCT: 24 % — ABNORMAL LOW (ref 39.0–52.0)
Hemoglobin: 11.9 g/dL — ABNORMAL LOW (ref 13.0–17.0)
Hemoglobin: 10.5 g/dL — ABNORMAL LOW (ref 13.0–17.0)
Hemoglobin: 8.2 g/dL — ABNORMAL LOW (ref 13.0–17.0)
Hemoglobin: 8.5 g/dL — ABNORMAL LOW (ref 13.0–17.0)
Hemoglobin: 8.2 g/dL — ABNORMAL LOW (ref 13.0–17.0)
Potassium: 4.3 mmol/L (ref 3.5–5.1)
Potassium: 4.2 mmol/L (ref 3.5–5.1)
Potassium: 4.3 mmol/L (ref 3.5–5.1)
Potassium: 5.4 mmol/L — ABNORMAL HIGH (ref 3.5–5.1)
Potassium: 4.5 mmol/L (ref 3.5–5.1)
Sodium: 139 mmol/L (ref 135–145)
Sodium: 138 mmol/L (ref 135–145)
Sodium: 138 mmol/L (ref 135–145)
Sodium: 136 mmol/L (ref 135–145)
Sodium: 138 mmol/L (ref 135–145)
TCO2: 23 mmol/L (ref 22–32)
TCO2: 23 mmol/L (ref 22–32)
TCO2: 25 mmol/L (ref 22–32)
TCO2: 24 mmol/L (ref 22–32)
TCO2: 24 mmol/L (ref 22–32)

## 2021-10-06 LAB — CBC
HCT: 25.3 % — ABNORMAL LOW (ref 39.0–52.0)
Hemoglobin: 8.2 g/dL — ABNORMAL LOW (ref 13.0–17.0)
MCH: 29.5 pg (ref 26.0–34.0)
MCHC: 32.4 g/dL (ref 30.0–36.0)
MCV: 91 fL (ref 80.0–100.0)
Platelets: 111 10*3/uL — ABNORMAL LOW (ref 150–400)
RBC: 2.78 MIL/uL — ABNORMAL LOW (ref 4.22–5.81)
RDW: 12.9 % (ref 11.5–15.5)
WBC: 8 10*3/uL (ref 4.0–10.5)
nRBC: 0 % (ref 0.0–0.2)

## 2021-10-06 LAB — POCT I-STAT 7, (LYTES, BLD GAS, ICA,H+H)
Acid-Base Excess: 2 mmol/L (ref 0.0–2.0)
Acid-Base Excess: 0 mmol/L (ref 0.0–2.0)
Acid-Base Excess: 0 mmol/L (ref 0.0–2.0)
Acid-Base Excess: 1 mmol/L (ref 0.0–2.0)
Acid-base deficit: 1 mmol/L (ref 0.0–2.0)
Bicarbonate: 24.6 mmol/L (ref 20.0–28.0)
Bicarbonate: 27.9 mmol/L (ref 20.0–28.0)
Bicarbonate: 25.4 mmol/L (ref 20.0–28.0)
Bicarbonate: 24.3 mmol/L (ref 20.0–28.0)
Bicarbonate: 25.8 mmol/L (ref 20.0–28.0)
Calcium, Ion: 1.22 mmol/L (ref 1.15–1.40)
Calcium, Ion: 1.06 mmol/L — ABNORMAL LOW (ref 1.15–1.40)
Calcium, Ion: 1.11 mmol/L — ABNORMAL LOW (ref 1.15–1.40)
Calcium, Ion: 1.12 mmol/L — ABNORMAL LOW (ref 1.15–1.40)
Calcium, Ion: 1.12 mmol/L — ABNORMAL LOW (ref 1.15–1.40)
HCT: 33 % — ABNORMAL LOW (ref 39.0–52.0)
HCT: 26 % — ABNORMAL LOW (ref 39.0–52.0)
HCT: 23 % — ABNORMAL LOW (ref 39.0–52.0)
HCT: 23 % — ABNORMAL LOW (ref 39.0–52.0)
HCT: 25 % — ABNORMAL LOW (ref 39.0–52.0)
Hemoglobin: 11.2 g/dL — ABNORMAL LOW (ref 13.0–17.0)
Hemoglobin: 8.8 g/dL — ABNORMAL LOW (ref 13.0–17.0)
Hemoglobin: 7.8 g/dL — ABNORMAL LOW (ref 13.0–17.0)
Hemoglobin: 7.8 g/dL — ABNORMAL LOW (ref 13.0–17.0)
Hemoglobin: 8.5 g/dL — ABNORMAL LOW (ref 13.0–17.0)
O2 Saturation: 100 %
O2 Saturation: 100 %
O2 Saturation: 100 %
O2 Saturation: 100 %
O2 Saturation: 100 %
Potassium: 4.3 mmol/L (ref 3.5–5.1)
Potassium: 4.3 mmol/L (ref 3.5–5.1)
Potassium: 4.7 mmol/L (ref 3.5–5.1)
Potassium: 4.6 mmol/L (ref 3.5–5.1)
Potassium: 5.1 mmol/L (ref 3.5–5.1)
Sodium: 140 mmol/L (ref 135–145)
Sodium: 141 mmol/L (ref 135–145)
Sodium: 139 mmol/L (ref 135–145)
Sodium: 138 mmol/L (ref 135–145)
Sodium: 139 mmol/L (ref 135–145)
TCO2: 26 mmol/L (ref 22–32)
TCO2: 29 mmol/L (ref 22–32)
TCO2: 27 mmol/L (ref 22–32)
TCO2: 25 mmol/L (ref 22–32)
TCO2: 27 mmol/L (ref 22–32)
pCO2 arterial: 42.3 mmHg (ref 32.0–48.0)
pCO2 arterial: 51.9 mmHg — ABNORMAL HIGH (ref 32.0–48.0)
pCO2 arterial: 45.6 mmHg (ref 32.0–48.0)
pCO2 arterial: 39.1 mmHg (ref 32.0–48.0)
pCO2 arterial: 43.9 mmHg (ref 32.0–48.0)
pH, Arterial: 7.373 (ref 7.350–7.450)
pH, Arterial: 7.338 — ABNORMAL LOW (ref 7.350–7.450)
pH, Arterial: 7.353 (ref 7.350–7.450)
pH, Arterial: 7.377 (ref 7.350–7.450)
pH, Arterial: 7.4 (ref 7.350–7.450)
pO2, Arterial: 336 mmHg — ABNORMAL HIGH (ref 83.0–108.0)
pO2, Arterial: 486 mmHg — ABNORMAL HIGH (ref 83.0–108.0)
pO2, Arterial: 332 mmHg — ABNORMAL HIGH (ref 83.0–108.0)
pO2, Arterial: 256 mmHg — ABNORMAL HIGH (ref 83.0–108.0)
pO2, Arterial: 328 mmHg — ABNORMAL HIGH (ref 83.0–108.0)

## 2021-10-06 LAB — GLUCOSE, CAPILLARY
Glucose-Capillary: 175 mg/dL — ABNORMAL HIGH (ref 70–99)
Glucose-Capillary: 95 mg/dL (ref 70–99)
Glucose-Capillary: 102 mg/dL — ABNORMAL HIGH (ref 70–99)
Glucose-Capillary: 104 mg/dL — ABNORMAL HIGH (ref 70–99)
Glucose-Capillary: 104 mg/dL — ABNORMAL HIGH (ref 70–99)
Glucose-Capillary: 119 mg/dL — ABNORMAL HIGH (ref 70–99)
Glucose-Capillary: 122 mg/dL — ABNORMAL HIGH (ref 70–99)
Glucose-Capillary: 121 mg/dL — ABNORMAL HIGH (ref 70–99)
Glucose-Capillary: 146 mg/dL — ABNORMAL HIGH (ref 70–99)
Glucose-Capillary: 133 mg/dL — ABNORMAL HIGH (ref 70–99)

## 2021-10-06 LAB — POCT I-STAT EG7
Acid-base deficit: 2 mmol/L (ref 0.0–2.0)
Bicarbonate: 24.4 mmol/L (ref 20.0–28.0)
Calcium, Ion: 1.09 mmol/L — ABNORMAL LOW (ref 1.15–1.40)
HCT: 29 % — ABNORMAL LOW (ref 39.0–52.0)
Hemoglobin: 9.9 g/dL — ABNORMAL LOW (ref 13.0–17.0)
O2 Saturation: 84 %
Potassium: 4.2 mmol/L (ref 3.5–5.1)
Sodium: 141 mmol/L (ref 135–145)
TCO2: 26 mmol/L (ref 22–32)
pCO2, Ven: 46.6 mmHg (ref 44.0–60.0)
pH, Ven: 7.328 (ref 7.250–7.430)
pO2, Ven: 52 mmHg — ABNORMAL HIGH (ref 32.0–45.0)

## 2021-10-06 LAB — PROTIME-INR
INR: 1.5 — ABNORMAL HIGH (ref 0.8–1.2)
Prothrombin Time: 17.7 seconds — ABNORMAL HIGH (ref 11.4–15.2)

## 2021-10-06 LAB — ABO/RH: ABO/RH(D): A POS

## 2021-10-06 LAB — HEMOGLOBIN AND HEMATOCRIT, BLOOD
HCT: 25.1 % — ABNORMAL LOW (ref 39.0–52.0)
Hemoglobin: 8.2 g/dL — ABNORMAL LOW (ref 13.0–17.0)

## 2021-10-06 LAB — APTT: aPTT: 32 seconds (ref 24–36)

## 2021-10-06 LAB — FIBRINOGEN: Fibrinogen: 251 mg/dL (ref 210–475)

## 2021-10-06 LAB — PREPARE RBC (CROSSMATCH)

## 2021-10-06 LAB — PLATELET COUNT: Platelets: 101 10*3/uL — ABNORMAL LOW (ref 150–400)

## 2021-10-06 SURGERY — CORONARY ARTERY BYPASS GRAFTING (CABG)
Anesthesia: General | Site: Leg Upper | Laterality: Right

## 2021-10-06 MED ORDER — ACETAMINOPHEN 160 MG/5ML PO SOLN
1000.0000 mg | Freq: Four times a day (QID) | ORAL | Status: DC
  Filled 2021-10-06: qty 40.6

## 2021-10-06 MED ORDER — HEPARIN SODIUM (PORCINE) 1000 UNIT/ML IJ SOLN
INTRAMUSCULAR | Status: AC
  Filled 2021-10-06: qty 1

## 2021-10-06 MED ORDER — POTASSIUM CHLORIDE 10 MEQ/50ML IV SOLN: 10.0000 meq | INTRAVENOUS | Status: AC

## 2021-10-06 MED ORDER — 0.9 % SODIUM CHLORIDE (POUR BTL) OPTIME
TOPICAL | Status: DC | PRN
  Administered 2021-10-06: 5000 mL

## 2021-10-06 MED ORDER — VANCOMYCIN HCL IN DEXTROSE 1-5 GM/200ML-% IV SOLN
1000.0000 mg | Freq: Once | INTRAVENOUS | Status: AC
  Administered 2021-10-06: 1000 mg via INTRAVENOUS
  Filled 2021-10-06: qty 200

## 2021-10-06 MED ORDER — PROTAMINE SULFATE 10 MG/ML IV SOLN
INTRAVENOUS | Status: DC | PRN
  Administered 2021-10-06: 320 mg via INTRAVENOUS

## 2021-10-06 MED ORDER — SODIUM CHLORIDE 0.9% FLUSH
10.0000 mL | Freq: Two times a day (BID) | INTRAVENOUS | Status: DC
  Administered 2021-10-06: 10 mL
  Administered 2021-10-06: 20 mL
  Administered 2021-10-07: 40 mL

## 2021-10-06 MED ORDER — HEPARIN SODIUM (PORCINE) 1000 UNIT/ML IJ SOLN
INTRAMUSCULAR | Status: DC | PRN
  Administered 2021-10-06: 32000 [IU] via INTRAVENOUS

## 2021-10-06 MED ORDER — LACTATED RINGERS IV SOLN: INTRAVENOUS | Status: DC

## 2021-10-06 MED ORDER — MIDAZOLAM HCL 2 MG/2ML IJ SOLN: 2.0000 mg | INTRAMUSCULAR | Status: DC | PRN

## 2021-10-06 MED ORDER — MIDAZOLAM HCL (PF) 10 MG/2ML IJ SOLN
INTRAMUSCULAR | Status: AC
  Filled 2021-10-06: qty 2

## 2021-10-06 MED ORDER — SODIUM CHLORIDE 0.9% IV SOLUTION: Freq: Once | INTRAVENOUS | Status: DC

## 2021-10-06 MED ORDER — LACTATED RINGERS IV SOLN: INTRAVENOUS | Status: DC | PRN

## 2021-10-06 MED ORDER — INSULIN REGULAR(HUMAN) IN NACL 100-0.9 UT/100ML-% IV SOLN: INTRAVENOUS | Status: DC

## 2021-10-06 MED ORDER — SODIUM CHLORIDE 0.9% FLUSH: 3.0000 mL | INTRAVENOUS | Status: DC | PRN

## 2021-10-06 MED ORDER — FENTANYL CITRATE (PF) 250 MCG/5ML IJ SOLN
INTRAMUSCULAR | Status: AC
  Filled 2021-10-06: qty 5

## 2021-10-06 MED ORDER — CHLORHEXIDINE GLUCONATE 4 % EX LIQD: 30.0000 mL | CUTANEOUS | Status: DC

## 2021-10-06 MED ORDER — ALBUMIN HUMAN 5 % IV SOLN: INTRAVENOUS | Status: DC | PRN

## 2021-10-06 MED ORDER — NICARDIPINE HCL IN NACL 20-0.86 MG/200ML-% IV SOLN
3.0000 mg/h | INTRAVENOUS | Status: DC
  Filled 2021-10-06: qty 200

## 2021-10-06 MED ORDER — HEMOSTATIC AGENTS (NO CHARGE) OPTIME
TOPICAL | Status: DC | PRN
  Administered 2021-10-06: 1 via TOPICAL

## 2021-10-06 MED ORDER — METOPROLOL TARTRATE 25 MG/10 ML ORAL SUSPENSION: 12.5000 mg | Freq: Two times a day (BID) | ORAL | Status: DC

## 2021-10-06 MED ORDER — ROCURONIUM BROMIDE 10 MG/ML (PF) SYRINGE
PREFILLED_SYRINGE | INTRAVENOUS | Status: DC | PRN
  Administered 2021-10-06: 80 mg via INTRAVENOUS
  Administered 2021-10-06 (×2): 20 mg via INTRAVENOUS
  Administered 2021-10-06 (×2): 50 mg via INTRAVENOUS

## 2021-10-06 MED ORDER — ACETAMINOPHEN 500 MG PO TABS
1000.0000 mg | ORAL_TABLET | Freq: Four times a day (QID) | ORAL | Status: DC
  Administered 2021-10-07 – 2021-10-10 (×14): 1000 mg via ORAL
  Filled 2021-10-06 (×14): qty 2

## 2021-10-06 MED ORDER — SODIUM CHLORIDE 0.9 % IV SOLN: INTRAVENOUS | Status: DC

## 2021-10-06 MED ORDER — ACETAMINOPHEN 160 MG/5ML PO SOLN
650.0000 mg | Freq: Once | ORAL | Status: AC
  Administered 2021-10-06: 650 mg
  Filled 2021-10-06: qty 20.3

## 2021-10-06 MED ORDER — ORAL CARE MOUTH RINSE
15.0000 mL | OROMUCOSAL | Status: DC
  Administered 2021-10-06 – 2021-10-07 (×7): 15 mL via OROMUCOSAL

## 2021-10-06 MED ORDER — CHLORHEXIDINE GLUCONATE 0.12 % MT SOLN
15.0000 mL | OROMUCOSAL | Status: AC
  Administered 2021-10-06: 15 mL via OROMUCOSAL

## 2021-10-06 MED ORDER — METOPROLOL TARTRATE 5 MG/5ML IV SOLN: 2.5000 mg | INTRAVENOUS | Status: DC | PRN

## 2021-10-06 MED ORDER — MIDAZOLAM HCL (PF) 5 MG/ML IJ SOLN
INTRAMUSCULAR | Status: DC | PRN
  Administered 2021-10-06 (×5): 2 mg via INTRAVENOUS

## 2021-10-06 MED ORDER — PHENYLEPHRINE HCL-NACL 20-0.9 MG/250ML-% IV SOLN: 0.0000 ug/min | INTRAVENOUS | Status: DC

## 2021-10-06 MED ORDER — DOCUSATE SODIUM 100 MG PO CAPS
200.0000 mg | ORAL_CAPSULE | Freq: Every day | ORAL | Status: DC
  Administered 2021-10-07 – 2021-10-09 (×3): 200 mg via ORAL
  Filled 2021-10-06 (×3): qty 2

## 2021-10-06 MED ORDER — CHLORHEXIDINE GLUCONATE CLOTH 2 % EX PADS
6.0000 | MEDICATED_PAD | Freq: Every day | CUTANEOUS | Status: DC
  Administered 2021-10-06 – 2021-10-10 (×5): 6 via TOPICAL

## 2021-10-06 MED ORDER — SODIUM CHLORIDE 0.9% IV SOLUTION: Freq: Once | INTRAVENOUS | Status: AC

## 2021-10-06 MED ORDER — MAGNESIUM SULFATE 4 GM/100ML IV SOLN
4.0000 g | Freq: Once | INTRAVENOUS | Status: AC
  Administered 2021-10-06: 4 g via INTRAVENOUS
  Filled 2021-10-06: qty 100

## 2021-10-06 MED ORDER — BISACODYL 10 MG RE SUPP: 10.0000 mg | Freq: Every day | RECTAL | Status: DC

## 2021-10-06 MED ORDER — ACETAMINOPHEN 650 MG RE SUPP: 650.0000 mg | Freq: Once | RECTAL | Status: AC

## 2021-10-06 MED ORDER — SODIUM CHLORIDE 0.9% FLUSH: 10.0000 mL | INTRAVENOUS | Status: DC | PRN

## 2021-10-06 MED ORDER — PANTOPRAZOLE SODIUM 40 MG PO TBEC
40.0000 mg | DELAYED_RELEASE_TABLET | Freq: Every day | ORAL | Status: DC
  Administered 2021-10-08 – 2021-10-10 (×3): 40 mg via ORAL
  Filled 2021-10-06 (×3): qty 1

## 2021-10-06 MED ORDER — OXYCODONE HCL 5 MG PO TABS
5.0000 mg | ORAL_TABLET | ORAL | Status: DC | PRN
  Administered 2021-10-06: 22:00:00 10 mg via ORAL
  Administered 2021-10-10: 14:00:00 5 mg via ORAL
  Filled 2021-10-06: qty 2
  Filled 2021-10-06: qty 1

## 2021-10-06 MED ORDER — CHLORHEXIDINE GLUCONATE 0.12% ORAL RINSE (MEDLINE KIT)
15.0000 mL | Freq: Two times a day (BID) | OROMUCOSAL | Status: DC
  Administered 2021-10-06 – 2021-10-07 (×2): 15 mL via OROMUCOSAL

## 2021-10-06 MED ORDER — PROPOFOL 10 MG/ML IV BOLUS
INTRAVENOUS | Status: DC | PRN
  Administered 2021-10-06: 80 mg via INTRAVENOUS

## 2021-10-06 MED ORDER — THROMBIN (RECOMBINANT) 20000 UNITS EX SOLR
CUTANEOUS | Status: AC
  Filled 2021-10-06: qty 20000

## 2021-10-06 MED ORDER — METOPROLOL TARTRATE 12.5 MG HALF TABLET: 12.5000 mg | ORAL_TABLET | Freq: Two times a day (BID) | ORAL | Status: DC

## 2021-10-06 MED ORDER — ~~LOC~~ CARDIAC SURGERY, PATIENT & FAMILY EDUCATION
Freq: Once | Status: DC
  Filled 2021-10-06: qty 1

## 2021-10-06 MED ORDER — CHLORHEXIDINE GLUCONATE 0.12 % MT SOLN
15.0000 mL | Freq: Once | OROMUCOSAL | Status: AC
  Administered 2021-10-06: 15 mL via OROMUCOSAL
  Filled 2021-10-06: qty 15

## 2021-10-06 MED ORDER — FAMOTIDINE IN NACL 20-0.9 MG/50ML-% IV SOLN
20.0000 mg | Freq: Two times a day (BID) | INTRAVENOUS | Status: AC
  Administered 2021-10-06 (×2): 20 mg via INTRAVENOUS
  Filled 2021-10-06 (×2): qty 50

## 2021-10-06 MED ORDER — ASPIRIN EC 325 MG PO TBEC
325.0000 mg | DELAYED_RELEASE_TABLET | Freq: Every day | ORAL | Status: DC
  Administered 2021-10-07 – 2021-10-09 (×3): 325 mg via ORAL
  Filled 2021-10-06 (×3): qty 1

## 2021-10-06 MED ORDER — TRAMADOL HCL 50 MG PO TABS
50.0000 mg | ORAL_TABLET | ORAL | Status: DC | PRN
  Administered 2021-10-07: 50 mg via ORAL
  Administered 2021-10-07: 100 mg via ORAL
  Filled 2021-10-06: qty 2
  Filled 2021-10-06: qty 1

## 2021-10-06 MED ORDER — THROMBIN 20000 UNITS EX SOLR
OROMUCOSAL | Status: DC | PRN
  Administered 2021-10-06 (×3): 4 mL via TOPICAL

## 2021-10-06 MED ORDER — ONDANSETRON HCL 4 MG/2ML IJ SOLN
4.0000 mg | Freq: Four times a day (QID) | INTRAMUSCULAR | Status: DC | PRN
  Administered 2021-10-07 – 2021-10-08 (×2): 4 mg via INTRAVENOUS
  Filled 2021-10-06 (×2): qty 2

## 2021-10-06 MED ORDER — PROTAMINE SULFATE 10 MG/ML IV SOLN
INTRAVENOUS | Status: AC
  Filled 2021-10-06: qty 25

## 2021-10-06 MED ORDER — ASPIRIN 81 MG PO CHEW: 324.0000 mg | CHEWABLE_TABLET | Freq: Every day | ORAL | Status: DC

## 2021-10-06 MED ORDER — LACTATED RINGERS IV SOLN: 500.0000 mL | Freq: Once | INTRAVENOUS | Status: DC | PRN

## 2021-10-06 MED ORDER — PROPOFOL 10 MG/ML IV BOLUS
INTRAVENOUS | Status: AC
  Filled 2021-10-06: qty 20

## 2021-10-06 MED ORDER — SODIUM CHLORIDE 0.45 % IV SOLN: INTRAVENOUS | Status: DC | PRN

## 2021-10-06 MED ORDER — ALBUMIN HUMAN 5 % IV SOLN
250.0000 mL | INTRAVENOUS | Status: DC | PRN
  Administered 2021-10-06 – 2021-10-07 (×2): 12.5 g via INTRAVENOUS

## 2021-10-06 MED ORDER — LEVOFLOXACIN IN D5W 750 MG/150ML IV SOLN
750.0000 mg | INTRAVENOUS | Status: AC
  Administered 2021-10-07: 750 mg via INTRAVENOUS
  Filled 2021-10-06: qty 150

## 2021-10-06 MED ORDER — ROCURONIUM BROMIDE 10 MG/ML (PF) SYRINGE
PREFILLED_SYRINGE | INTRAVENOUS | Status: AC
  Filled 2021-10-06: qty 20

## 2021-10-06 MED ORDER — MORPHINE SULFATE (PF) 2 MG/ML IV SOLN
1.0000 mg | INTRAVENOUS | Status: DC | PRN
  Administered 2021-10-06 – 2021-10-07 (×7): 2 mg via INTRAVENOUS
  Filled 2021-10-06 (×7): qty 1

## 2021-10-06 MED ORDER — MILRINONE LACTATE IN DEXTROSE 20-5 MG/100ML-% IV SOLN
0.1250 ug/kg/min | INTRAVENOUS | Status: DC
Start: 2021-10-06 — End: 2021-10-08
  Administered 2021-10-07: 0.125 ug/kg/min via INTRAVENOUS
  Filled 2021-10-06: qty 100

## 2021-10-06 MED ORDER — DEXMEDETOMIDINE HCL IN NACL 400 MCG/100ML IV SOLN
0.0000 ug/kg/h | INTRAVENOUS | Status: DC
  Administered 2021-10-06: 0.5 ug/kg/h via INTRAVENOUS
  Filled 2021-10-06: qty 100

## 2021-10-06 MED ORDER — EPHEDRINE 5 MG/ML INJ
INTRAVENOUS | Status: AC
  Filled 2021-10-06: qty 5

## 2021-10-06 MED ORDER — DEXTROSE 50 % IV SOLN: 0.0000 mL | INTRAVENOUS | Status: DC | PRN

## 2021-10-06 MED ORDER — PROTAMINE SULFATE 10 MG/ML IV SOLN
INTRAVENOUS | Status: AC
  Filled 2021-10-06: qty 5

## 2021-10-06 MED ORDER — PHENYLEPHRINE 40 MCG/ML (10ML) SYRINGE FOR IV PUSH (FOR BLOOD PRESSURE SUPPORT)
PREFILLED_SYRINGE | INTRAVENOUS | Status: AC
  Filled 2021-10-06: qty 10

## 2021-10-06 MED ORDER — SODIUM CHLORIDE 0.9 % IV SOLN: 250.0000 mL | INTRAVENOUS | Status: DC

## 2021-10-06 MED ORDER — METOPROLOL TARTRATE 12.5 MG HALF TABLET
12.5000 mg | ORAL_TABLET | Freq: Once | ORAL | Status: DC
  Filled 2021-10-06: qty 1

## 2021-10-06 MED ORDER — PHENYLEPHRINE 40 MCG/ML (10ML) SYRINGE FOR IV PUSH (FOR BLOOD PRESSURE SUPPORT)
PREFILLED_SYRINGE | INTRAVENOUS | Status: DC | PRN
  Administered 2021-10-06 (×6): 80 ug via INTRAVENOUS

## 2021-10-06 MED ORDER — BISACODYL 5 MG PO TBEC
10.0000 mg | DELAYED_RELEASE_TABLET | Freq: Every day | ORAL | Status: DC
  Administered 2021-10-07 – 2021-10-09 (×3): 10 mg via ORAL
  Filled 2021-10-06 (×3): qty 2

## 2021-10-06 MED ORDER — SODIUM CHLORIDE 0.9% FLUSH
3.0000 mL | Freq: Two times a day (BID) | INTRAVENOUS | Status: DC
  Administered 2021-10-07: 3 mL via INTRAVENOUS
  Administered 2021-10-07: 10 mL via INTRAVENOUS
  Administered 2021-10-08 – 2021-10-09 (×3): 3 mL via INTRAVENOUS

## 2021-10-06 MED ORDER — NITROGLYCERIN IN D5W 200-5 MCG/ML-% IV SOLN: 0.0000 ug/min | INTRAVENOUS | Status: DC

## 2021-10-06 MED ORDER — FENTANYL CITRATE (PF) 250 MCG/5ML IJ SOLN
INTRAMUSCULAR | Status: DC | PRN
  Administered 2021-10-06: 350 ug via INTRAVENOUS
  Administered 2021-10-06: 100 ug via INTRAVENOUS
  Administered 2021-10-06: 50 ug via INTRAVENOUS
  Administered 2021-10-06: 100 ug via INTRAVENOUS
  Administered 2021-10-06: 50 ug via INTRAVENOUS

## 2021-10-06 SURGICAL SUPPLY — 102 items
BAG DECANTER FOR FLEXI CONT (MISCELLANEOUS) ×6 IMPLANT
BLADE CLIPPER SURG (BLADE) ×6 IMPLANT
BLADE STERNUM SYSTEM 6 (BLADE) ×6 IMPLANT
BNDG ELASTIC 4X5.8 VLCR STR LF (GAUZE/BANDAGES/DRESSINGS) ×6 IMPLANT
BNDG ELASTIC 6X5.8 VLCR STR LF (GAUZE/BANDAGES/DRESSINGS) ×6 IMPLANT
BNDG GAUZE ELAST 4 BULKY (GAUZE/BANDAGES/DRESSINGS) ×6 IMPLANT
CANISTER SUCT 3000ML PPV (MISCELLANEOUS) ×6 IMPLANT
CATH ROBINSON RED A/P 18FR (CATHETERS) ×12 IMPLANT
CATH THORACIC 28FR (CATHETERS) ×8 IMPLANT
CATH THORACIC 36FR (CATHETERS) ×6 IMPLANT
CATH THORACIC 36FR RT ANG (CATHETERS) ×6 IMPLANT
CLIP TI WIDE RED SMALL 24 (CLIP) ×2 IMPLANT
CLIP VESOCCLUDE MED 24/CT (CLIP) IMPLANT
CLIP VESOCCLUDE SM WIDE 24/CT (CLIP) IMPLANT
CONN 3/8X3/8 GISH STERILE (MISCELLANEOUS) ×4 IMPLANT
CONTAINER PROTECT SURGISLUSH (MISCELLANEOUS) ×12 IMPLANT
DRAPE CARDIOVASCULAR INCISE (DRAPES) ×6
DRAPE SRG 135X102X78XABS (DRAPES) ×4 IMPLANT
DRAPE WARM FLUID 44X44 (DRAPES) ×6 IMPLANT
DRSG COVADERM 4X14 (GAUZE/BANDAGES/DRESSINGS) ×6 IMPLANT
ELECT BLADE 4.0 EZ CLEAN MEGAD (MISCELLANEOUS) ×6
ELECT CAUTERY BLADE 6.4 (BLADE) ×6 IMPLANT
ELECT REM PT RETURN 9FT ADLT (ELECTROSURGICAL) ×12
ELECTRODE BLDE 4.0 EZ CLN MEGD (MISCELLANEOUS) IMPLANT
ELECTRODE REM PT RTRN 9FT ADLT (ELECTROSURGICAL) ×8 IMPLANT
FELT TEFLON 1X6 (MISCELLANEOUS) ×10 IMPLANT
GAUZE 4X4 16PLY ~~LOC~~+RFID DBL (SPONGE) ×2 IMPLANT
GAUZE SPONGE 4X4 12PLY STRL (GAUZE/BANDAGES/DRESSINGS) ×12 IMPLANT
GAUZE SPONGE 4X4 12PLY STRL LF (GAUZE/BANDAGES/DRESSINGS) ×4 IMPLANT
GLOVE SURG ENC MOIS LTX SZ6 (GLOVE) IMPLANT
GLOVE SURG ENC MOIS LTX SZ6.5 (GLOVE) IMPLANT
GLOVE SURG ENC MOIS LTX SZ7 (GLOVE) IMPLANT
GLOVE SURG ENC MOIS LTX SZ7.5 (GLOVE) IMPLANT
GLOVE SURG MICRO LTX SZ6.5 (GLOVE) ×8 IMPLANT
GLOVE SURG MICRO LTX SZ7 (GLOVE) ×12 IMPLANT
GLOVE SURG ORTHO LTX SZ7.5 (GLOVE) IMPLANT
GLOVE SURG UNDER POLY LF SZ6 (GLOVE) IMPLANT
GLOVE SURG UNDER POLY LF SZ6.5 (GLOVE) IMPLANT
GLOVE SURG UNDER POLY LF SZ7 (GLOVE) IMPLANT
GOWN STRL REUS W/ TWL LRG LVL3 (GOWN DISPOSABLE) ×16 IMPLANT
GOWN STRL REUS W/ TWL XL LVL3 (GOWN DISPOSABLE) ×4 IMPLANT
GOWN STRL REUS W/TWL LRG LVL3 (GOWN DISPOSABLE) ×36
GOWN STRL REUS W/TWL XL LVL3 (GOWN DISPOSABLE) ×12
HEMOSTAT POWDER SURGIFOAM 1G (HEMOSTASIS) ×18 IMPLANT
HEMOSTAT SURGICEL 2X14 (HEMOSTASIS) ×8 IMPLANT
INSERT FOGARTY 61MM (MISCELLANEOUS) IMPLANT
INSERT FOGARTY XLG (MISCELLANEOUS) ×2 IMPLANT
KIT BASIN OR (CUSTOM PROCEDURE TRAY) ×6 IMPLANT
KIT CATH CPB BARTLE (MISCELLANEOUS) ×6 IMPLANT
KIT SUCTION CATH 14FR (SUCTIONS) ×6 IMPLANT
KIT TURNOVER KIT B (KITS) ×6 IMPLANT
KIT VASOVIEW HEMOPRO 2 VH 4000 (KITS) ×6 IMPLANT
NS IRRIG 1000ML POUR BTL (IV SOLUTION) ×30 IMPLANT
PACK E OPEN HEART (SUTURE) ×6 IMPLANT
PACK OPEN HEART (CUSTOM PROCEDURE TRAY) ×6 IMPLANT
PAD ARMBOARD 7.5X6 YLW CONV (MISCELLANEOUS) ×12 IMPLANT
PAD ELECT DEFIB RADIOL ZOLL (MISCELLANEOUS) ×6 IMPLANT
PENCIL BUTTON HOLSTER BLD 10FT (ELECTRODE) ×6 IMPLANT
POSITIONER HEAD DONUT 9IN (MISCELLANEOUS) ×6 IMPLANT
PUNCH AORTIC ROTATE 4.0MM (MISCELLANEOUS) IMPLANT
PUNCH AORTIC ROTATE 4.5MM 8IN (MISCELLANEOUS) ×6 IMPLANT
PUNCH AORTIC ROTATE 5MM 8IN (MISCELLANEOUS) IMPLANT
SET MPS 3-ND DEL (MISCELLANEOUS) ×2 IMPLANT
SPONGE INTESTINAL PEANUT (DISPOSABLE) IMPLANT
SPONGE T-LAP 18X18 ~~LOC~~+RFID (SPONGE) ×10 IMPLANT
SPONGE T-LAP 4X18 ~~LOC~~+RFID (SPONGE) ×8 IMPLANT
SUPPORT HEART JANKE-BARRON (MISCELLANEOUS) ×6 IMPLANT
SUT BONE WAX W31G (SUTURE) ×6 IMPLANT
SUT MNCRL AB 4-0 PS2 18 (SUTURE) IMPLANT
SUT PROLENE 3 0 SH DA (SUTURE) IMPLANT
SUT PROLENE 3 0 SH1 36 (SUTURE) ×6 IMPLANT
SUT PROLENE 4 0 RB 1 (SUTURE)
SUT PROLENE 4 0 SH DA (SUTURE) IMPLANT
SUT PROLENE 4-0 RB1 .5 CRCL 36 (SUTURE) IMPLANT
SUT PROLENE 5 0 C 1 36 (SUTURE) IMPLANT
SUT PROLENE 6 0 C 1 30 (SUTURE) IMPLANT
SUT PROLENE 7 0 BV 1 (SUTURE) ×2 IMPLANT
SUT PROLENE 7 0 BV1 MDA (SUTURE) ×6 IMPLANT
SUT PROLENE 8 0 BV175 6 (SUTURE) ×2 IMPLANT
SUT SILK  1 MH (SUTURE) ×6
SUT SILK 1 MH (SUTURE) IMPLANT
SUT SILK 2 0 SH (SUTURE) IMPLANT
SUT STEEL STERNAL CCS#1 18IN (SUTURE) ×2 IMPLANT
SUT STEEL SZ 6 DBL 3X14 BALL (SUTURE) ×4 IMPLANT
SUT VIC AB 1 CTX 36 (SUTURE) ×18
SUT VIC AB 1 CTX36XBRD ANBCTR (SUTURE) ×8 IMPLANT
SUT VIC AB 2-0 CT1 27 (SUTURE) ×6
SUT VIC AB 2-0 CT1 TAPERPNT 27 (SUTURE) IMPLANT
SUT VIC AB 2-0 CTX 27 (SUTURE) IMPLANT
SUT VIC AB 3-0 SH 27 (SUTURE)
SUT VIC AB 3-0 SH 27X BRD (SUTURE) IMPLANT
SUT VIC AB 3-0 X1 27 (SUTURE) ×2 IMPLANT
SUT VICRYL 4-0 PS2 18IN ABS (SUTURE) IMPLANT
SYSTEM SAHARA CHEST DRAIN ATS (WOUND CARE) ×6 IMPLANT
TAPE CLOTH SURG 4X10 WHT LF (GAUZE/BANDAGES/DRESSINGS) ×4 IMPLANT
TAPE PAPER 2X10 WHT MICROPORE (GAUZE/BANDAGES/DRESSINGS) ×2 IMPLANT
TOWEL GREEN STERILE (TOWEL DISPOSABLE) ×6 IMPLANT
TOWEL GREEN STERILE FF (TOWEL DISPOSABLE) ×6 IMPLANT
TRAY FOLEY SLVR 16FR TEMP STAT (SET/KITS/TRAYS/PACK) ×6 IMPLANT
TUBING LAP HI FLOW INSUFFLATIO (TUBING) ×6 IMPLANT
UNDERPAD 30X36 HEAVY ABSORB (UNDERPADS AND DIAPERS) ×6 IMPLANT
WATER STERILE IRR 1000ML POUR (IV SOLUTION) ×12 IMPLANT

## 2021-10-06 NOTE — Procedures (Signed)
Extubation Procedure Note  Patient Details:   Name: Brett Walker DOB: March 30, 1961 MRN: [id number]   Airway Documentation:    Vent end date: 10/06/21 Vent end time: 2144   Evaluation  O2 sats: stable throughout Complications: No apparent complications Patient did tolerate procedure well. Bilateral Breath Sounds: Diminished, Coarse crackles   Yes, ability to speak post extubation. No stridor, clear/dim lung sounds. No distress noted. Placed on 2L [place]  Seleny Allbright W Demiana Crumbley 10/06/2021, 9:55 PM

## 2021-10-06 NOTE — Progress Notes (Signed)
NIF and Vital capacity performed. Greater than -40, 1.68. Cuff leak noted, pt awake and alert. Abg drawn and resulted.

## 2021-10-06 NOTE — Interval H&P Note (Signed)
History and Physical Interval Note:  10/06/2021 7:04 AM  Brett Walker  has presented today for surgery, with the diagnosis of CAD.  The various methods of treatment have been discussed with the patient and family. After consideration of risks, benefits and other options for treatment, the patient has consented to  Procedure(s): CORONARY ARTERY BYPASS GRAFTING (CABG) (N/A) TRANSESOPHAGEAL ECHOCARDIOGRAM (TEE) (N/A) as a surgical intervention.  The patient's history has been reviewed, patient examined, no change in status, stable for surgery.  I have reviewed the patient's chart and labs.  Questions were answered to the patient's satisfaction.     [name]

## 2021-10-06 NOTE — Anesthesia Procedure Notes (Addendum)
Central Venous Catheter Insertion Performed by: Achille Rich, MD, anesthesiologist Start/End12/16/2022 7:22 AM, 10/06/2021 7:26 AM Patient location: Pre-op. Preanesthetic checklist: patient identified, IV checked, site marked, risks and benefits discussed, surgical consent, monitors and equipment checked, pre-op evaluation, timeout performed and anesthesia consent Position: Trendelenburg Patient sedated Hand hygiene performed  and maximum sterile barriers used  PA cath was placed.Swan type:thermodilution Procedure performed without using ultrasound guided technique. Attempts: 1 Patient tolerated the procedure well with no immediate complications.

## 2021-10-06 NOTE — Transfer of Care (Signed)
Immediate Anesthesia Transfer of Care Note  Patient: Brett Walker  Procedure(s) Performed: CORONARY ARTERY BYPASS GRAFTING (CABG) x 5 ON CARDIOPULMONARY BYPASS USING [place] AND RIGHT GREATER SAPHENOUS VEIN (Chest) TRANSESOPHAGEAL ECHOCARDIOGRAM (TEE) APPLICATION OF CELL SAVER ENDOVEIN HARVEST OF GREATER SAPHENOUS VEIN (Right: Leg Upper)  Patient Location: PACU and ICU  Anesthesia Type:General  Level of Consciousness: sedated and Patient remains intubated per anesthesia plan  Airway & Oxygen Therapy: Patient remains intubated per anesthesia plan and Patient placed on Ventilator (see vital sign flow sheet for setting)  Post-op Assessment: Report given to RN and Post -op Vital signs reviewed and stable  Post vital signs: Reviewed and stable  Last Vitals:  Vitals Value Taken Time  BP    Temp 35.7 C 10/06/21 1338  Pulse 86 10/06/21 1338  Resp 12 10/06/21 1338  SpO2 98 % 10/06/21 1338  Vitals shown include unvalidated device data.  Last Pain:  Vitals:   10/06/21 0608  TempSrc: Oral         Complications: No notable events documented.

## 2021-10-06 NOTE — Consult Note (Addendum)
Advanced Heart Failure Team Consult Note   Primary Physician: Antony Contras, MD PCP-Cardiologist:  Dr. Haroldine Laws   Reason for Consultation: Post CABG Heart Failure Management   HPI:    Brett Walker is seen today for evaluation of post CABG heart failure managment at the request of Dr. Cyndia Bent, [facility].   60 y/o male w/ HTN, HLD, T2DM and newly diagnosed systolic heart failure/ ischemic CM and multivessel disease. Recently admitted for chest pain. Hs trop 55>>56. Echo EF 30-35%, GIIDD, RV normal. R/LHC showed severe 3VCAD (see angiographic details below), low filling pressures and normal CO, Fick cardiac output/index = 7.3/3.5. Referred for CABG.   Underwent CABGx 5 today (LIMA-LAD, SVG-Diag, sequential SVG-OM/OM2 and SVG-PDA).   Just returned from OR. Intubated and sedated.   On Milrinone 0.125 + Nitro gtt at 50 mcg, SBP goal <110 per Dr. Cyndia Bent. Current SBP 127 on A-line. MAP 81. RN triturating.    Hgb 8.2. Plts 111  SCr 1.0. UOP 1.3L   Swan #s PAP 33/19 (25) CO 6.11 CI 2.95  No CVP set up    2D Echo 09/27/21 left ventricular ejection fraction, by estimation, is 30 to 35%. The left ventricle has moderately decreased function. The left ventricle demonstrates global hypokinesis. There is mild left ventricular hypertrophy. Left ventricular diastolic parameters are consistent with Grade II diastolic dysfunction (pseudonormalization). Elevated left atrial pressure. 1. 2. Right ventricular systolic function is normal. The right ventricular size is normal. 3. Left atrial size was mildly dilated. 4. A small pericardial effusion is present. The mitral valve is normal in structure. Mild mitral valve regurgitation. No evidence of mitral stenosis. 5. The aortic valve is tricuspid. Aortic valve regurgitation is not visualized. Aortic valve sclerosis is present, with no evidence of aortic valve stenosis. 6. The inferior vena cava is dilated in size with >50% respiratory  variability, suggesting right atrial pressure of 8 mmHg.  Pre-CABG R/LHC 09/29/21   Ost RCA to Prox RCA lesion is 90% stenosed.   Dist RCA lesion is 99% stenosed.   RPDA lesion is 100% stenosed.   RPAV lesion is 100% stenosed.   Ost Cx to Prox Cx lesion is 80% stenosed.   1st Mrg lesion is 95% stenosed.   2nd Mrg lesion is 95% stenosed.   Prox LAD to Mid LAD lesion is 80% stenosed.   1st Diag lesion is 100% stenosed.   2nd Diag lesion is 99% stenosed.   Mid LAD to Dist LAD lesion is 75% stenosed.   The left ventricular ejection fraction is 25-35% by visual estimate.   Findings:   Ao = 101/71 (88) LV = 102/5 RA =  2 RV = 27/5 PA = 30/12 (19) PCW = 2 Fick cardiac output/index = 7.3/3.5 PVR = 2.2 WU SVR = 941 Ao sat = 95% PA sat = 72%, 73% SVC sat = 75%   Review of Systems: Unable to check ROS as pt is currently intubated and sedated.    Home Medications Prior to Admission medications   Medication Sig Start Date End Date Taking? Authorizing Provider  aspirin EC 81 MG EC tablet Take 1 tablet (81 mg total) by mouth daily. Swallow whole. 10/01/21  Yes Darliss Cheney, MD  carvedilol (COREG) 3.125 MG tablet Take 1 tablet (3.125 mg total) by mouth 2 (two) times daily with a meal. 09/30/21 10/30/21 Yes Pahwani, Einar Grad, MD  furosemide (LASIX) 20 MG tablet Take 20 mg by mouth daily. 09/26/21  Yes [provider]  gabapentin (NEURONTIN) 300 MG capsule Take 900 mg by mouth at bedtime.   Yes [provider]  metFORMIN (GLUCOPHAGE-XR) 750 MG 24 hr tablet Take 750 mg by mouth 2 (two) times daily. 09/05/21  Yes [provider]  OVER THE COUNTER MEDICATION Take 1 capsule by mouth at bedtime as needed (sleep). CBD gummies   Yes [provider]  pravastatin (PRAVACHOL) 40 MG tablet Take 1 tablet (40 mg total) by mouth daily at 6 PM. 09/30/21 10/30/21 Yes Pahwani, Einar Grad, MD  sacubitril-valsartan (ENTRESTO) 24-26 MG Take 1 tablet by mouth 2 (two) times daily.  09/30/21 10/30/21 Yes Pahwani, Einar Grad, MD  spironolactone (ALDACTONE) 25 MG tablet Take 0.5 tablets (12.5 mg total) by mouth daily. 10/01/21 10/31/21 Yes Pahwani, Einar Grad, MD  aspirin EC 325 MG tablet Take 972 mg by mouth 2 (two) times daily as needed for moderate pain.    [provider]    Past Medical History: Past Medical History:  Diagnosis Date   Cardiomyopathy ([place])    CHF (congestive heart failure) ([place])    Coronary artery disease    Diabetes mellitus without complication ([employer])    Diabetic peripheral neuropathy ([facility])    Diabetic retinopathy ([place])    Diverticulitis    Dyspnea    Elevated LDL cholesterol level    Family history of adverse reaction to anesthesia    Patient states his dad "woke up crazy from anesthesia and had to be restrained"   Hypertension    Pharyngoesophageal dysphagia     Past Surgical History: Past Surgical History:  Procedure Laterality Date   EYE SURGERY     RIGHT/LEFT HEART CATH AND CORONARY ANGIOGRAPHY N/A 09/29/2021   Procedure: RIGHT/LEFT HEART CATH AND CORONARY ANGIOGRAPHY;  Surgeon: Jolaine Artist, MD;  Location: [facility];  Service: Cardiovascular;  Laterality: N/A;   WISDOM TOOTH EXTRACTION      Family History: Family History  Problem Relation Age of Onset   Parkinson's disease Father    CAD Father    Hydrocephalus Brother    CAD Paternal Grandfather     Social History: Social History   Socioeconomic History   Marital status: Single    Spouse name: Not on file   Number of children: 0   Years of education: 14   Highest education level: Not on file  Occupational History   Occupation: [employer]  Tobacco Use   Smoking status: Never   Smokeless tobacco: Never  Vaping Use   Vaping Use: Never used  Substance and Sexual Activity   Alcohol use: Not Currently    Comment: h/o heavy use   Drug use: Never   Sexual activity: Not on file  Other Topics Concern   Not on file  Social History Narrative   Works at [employer]   Right handed   First floor apartment   Social Determinants of Health   Financial Resource Strain: Not on file  Food Insecurity: Not on file  Transportation Needs: Not on file  Physical Activity: Not on file  Stress: Not on file  Social Connections: Not on file    Allergies:  Allergies  Allergen Reactions   Penicillin G Hives   Atorvastatin     fecal urgency   Crestor [Rosuvastatin]     GI cramping   Lipitor [Atorvastatin Calcium] Diarrhea   Ozempic (0.25 Or 0.5 Mg-Dose) [Semaglutide(0.25 Or 0.52m-Dos)] Diarrhea    Objective:    Vital Signs:   Temp:  [96.1 F (35.6  C)-98.2 F (36.8 C)] 96.1 F (35.6 C) (12/16 1449) Pulse Rate:  [73-90] 90 (12/16 1449) Resp:  [12-23] 14 (12/16 1449) BP: (85-127)/(65-72) 85/65 (12/16 1449) SpO2:  [98 %-100 %] 100 % (12/16 1449) Arterial Line BP: (75-125)/(36-71) 108/56 (12/16 1449) FiO2 (%):  [50 %] 50 % (12/16 1449) Weight:  [90.7 kg] 90.7 kg (12/16 0608) Last BM Date: 09/30/21  Weight change: Filed Weights   10/06/21 0608  Weight: 90.7 kg    Intake/Output:   Intake/Output Summary (Last 24 hours) at 10/06/2021 1509 Last data filed at 10/06/2021 1449 Gross per 24 hour  Intake 4281.61 ml  Output 1725 ml  Net 2556.61 ml      Physical Exam    General:  intubated and sedated  HEENT: +ETT  Neck: supple. JVP elevated. + Rt IJ Swan cath. Carotids 2+ bilat; no bruits. No lymphadenopathy or thyromegaly appreciated. Cor: + sternal dressings, + CTs. +ECPW, Regular rate & rhythm. No rubs, gallops or murmurs. Lungs: intubated and clear  Abdomen: soft, nontender, nondistended. No hepatosplenomegaly. No bruits or masses. Good bowel sounds. Extremities: no cyanosis, clubbing, rash, edema + bilateral SCDs  Neuro:intubated and sedated GU: + foley   Telemetry   NSR 90s, personally reviewed   EKG    NSR 89 bpm, nonspecific [place]ave abnormalities   Labs   Basic Metabolic Panel: Recent Labs  Lab 09/30/21 0126  10/04/21 0925 10/06/21 0802 10/06/21 0805 10/06/21 0935 10/06/21 1000 10/06/21 1025 10/06/21 1050 10/06/21 1126 10/06/21 1145 10/06/21 1219 10/06/21 1223  NA 136 137   < > 139 138   < > 138 139 136 138 139 138  K 3.6 4.4   < > 4.3 4.2   < > 4.3 4.7 5.4* 5.1 4.6 4.5  CL 104 103  --  107 107  --  104  --  104  --   --  105  CO2 21* 24  --   --   --   --   --   --   --   --   --   --   GLUCOSE 127* 180*  --  159* 140*  --  99  --  135*  --   --  154*  BUN 20 38*  --  30* 29*  --  27*  --  27*  --   --  27*  CREATININE 1.21 1.45*  --  1.10 1.10  --  1.10  --  1.00  --   --  1.00  CALCIUM 8.4* 9.2  --   --   --   --   --   --   --   --   --   --    < > = values in this interval not displayed.    Liver Function Tests: Recent Labs  Lab 10/04/21 0925  AST 18  ALT 18  ALKPHOS 59  BILITOT 0.4  PROT 6.1*  ALBUMIN 3.3*   No results for input(s): LIPASE, AMYLASE in the last 168 hours. No results for input(s): AMMONIA in the last 168 hours.  CBC: Recent Labs  Lab 10/04/21 0925 10/06/21 0802 10/06/21 1108 10/06/21 1126 10/06/21 1145 10/06/21 1219 10/06/21 1223 10/06/21 1358  WBC 5.1  --   --   --   --   --   --  8.0  HGB 14.0   < > 8.2* 8.5* 8.5* 7.8* 8.2* 8.2*  HCT 43.0   < > 25.1* 25.0* 25.0* 23.0* 24.0* 25.3*  MCV  91.9  --   --   --   --   --   --  91.0  PLT 177  --  101*  --   --   --   --  PENDING   < > = values in this interval not displayed.    Cardiac Enzymes: No results for input(s): CKTOTAL, CKMB, CKMBINDEX, TROPONINI in the last 168 hours.  BNP: BNP (last 3 results) Recent Labs    09/26/21 2216  BNP 1,194.7*    ProBNP (last 3 results) No results for input(s): PROBNP in the last 8760 hours.   CBG: Recent Labs  Lab 09/30/21 1144 10/04/21 0833 10/06/21 0621 10/06/21 1358 10/06/21 1431  GLUCAP 275* 245* 175* 95 102*    Coagulation Studies: Recent Labs    10/04/21 0925 10/06/21 1358  LABPROT 13.0 17.7*  INR 1.0 1.5*     Imaging   DG  Chest Port 1 View  Result Date: 10/06/2021 CLINICAL DATA:  Status post CABG x5. EXAM: PORTABLE CHEST 1 VIEW COMPARISON:  09/30/2021 FINDINGS: Interval median sternotomy for CABG. Endotracheal tube terminates 5.4 cm above carina. Nasogastric tube terminates in the body of the stomach with the side port just below the gastroesophageal junction. Right internal jugular Swan-Ganz catheter tip at pulmonary outflow tract. Bilateral chest tubes. Normal heart size for level of inspiration. Probable small left pleural effusion. No pneumothorax. Low lung volumes with resultant pulmonary interstitial prominence. Left base atelectasis. IMPRESSION: Expected appearance after median sternotomy for CABG. Appropriate position of support apparatus with trace left pleural fluid and adjacent left base atelectasis. No pneumothorax or other acute complication. Electronically Signed   By: Abigail Miyamoto M.D.   On: 10/06/2021 14:00     Medications:     Current Medications:  [START ON 10/07/2021] acetaminophen  1,000 mg Oral Q6H   Or   [START ON 10/07/2021] acetaminophen (TYLENOL) oral liquid 160 mg/5 mL  1,000 mg Per Tube Q6H   acetaminophen (TYLENOL) oral liquid 160 mg/5 mL  650 mg Per Tube Once   Or   acetaminophen  650 mg Rectal Once   [START ON 10/07/2021] aspirin EC  325 mg Oral Daily   Or   [START ON 10/07/2021] aspirin  324 mg Per Tube Daily   [START ON 10/07/2021] bisacodyl  10 mg Oral Daily   Or   [START ON 10/07/2021] bisacodyl  10 mg Rectal Daily   chlorhexidine gluconate (MEDLINE KIT)  15 mL Mouth Rinse BID   Chlorhexidine Gluconate Cloth  6 each Topical Daily   [START ON 10/07/2021] docusate sodium  200 mg Oral Daily   mouth rinse  15 mL Mouth Rinse 10 times per day   metoprolol tartrate  12.5 mg Oral BID   Or   metoprolol tartrate  12.5 mg Per Tube BID   [START ON 10/08/2021] pantoprazole  40 mg Oral Daily   sodium chloride flush  10-40 mL Intracatheter Q12H   [START ON 10/07/2021] sodium chloride  flush  3 mL Intravenous Q12H    Infusions:  sodium chloride 10 mL/hr at 10/06/21 1400   [START ON 10/07/2021] sodium chloride     sodium chloride 10 mL/hr at 10/06/21 1406   albumin human     dexmedetomidine (PRECEDEX) IV infusion 0.7 mcg/kg/hr (10/06/21 1400)   famotidine (PEPCID) IV 20 mg (10/06/21 1407)   insulin Stopped (10/06/21 1359)   lactated ringers     lactated ringers     lactated ringers 50 mL/hr at 10/06/21 1400   [  START ON 10/07/2021] levofloxacin (LEVAQUIN) IV     magnesium sulfate 20 mL/hr at 10/06/21 1400   milrinone 0.125 mcg/kg/min (10/06/21 1400)   nitroGLYCERIN 0 mcg/min (10/06/21 1330)   phenylephrine (NEO-SYNEPHRINE) Adult infusion 20 mcg/min (10/06/21 1400)   potassium chloride     vancomycin       Assessment/Plan   Multivessel CAD - s/p CABG x 5 (LIMA-LAD, SVG-Diag, sequential SVG-OM/OM2 and SVG-PDA) - asa, statin + ? blocker  2. 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 MAPs 80s. No pressor requirements currently  - CO 6.11, CI 2.95 off Swan. Continue to follow hemodynamics and wean Milrinone as tolerated - Set up CVP monitoring. Will begin gentle diuresis POD 2-3 if needed    - Will try adding GDMT later once stable and off milrinone  3. ABLA   - expected surgical blood loss - Hgb 8.2 - transfusion threshold per CT surgery   4. Type 2DM  - on insulin gtt per surgery   Length of Stay: 0  Brett Jester, PA-C  10/06/2021, 3:09 PM  Advanced Heart Failure Team Pager [phone] (M-F; 7a - 5p)  Please contact [facility] for night-coverage after hours (4p -7a ) and weekends on [email or web address]  Patient seen and examined with the above-signed Advanced Practice Provider and/or Housestaff. I personally reviewed laboratory data, imaging studies and relevant notes. I independently examined the patient and  formulated the important aspects of the plan. I have edited the note to reflect any of my changes or salient points. I have personally discussed the plan with the patient and/or family.  60 y/o male with recently diagnosed 3v CAD and ischemic CM. Underwent 5v CABG today with Dr. Cyndia Bent.   He remains intubated immediately post-op but came off pump easily. Only on milrinone 0.125. Chest tubes have dried out. PA pressures low. Maintaining NSR  General:  Intubated/sedated HEENT: normal + ETT Neck: supple. RIJ swan  Carotids 2+ bilat; no bruits. No lymphadenopathy or thryomegaly appreciated. Cor: Sternal dressing and CTs in place Regular rate & rhythm.  Lungs: clear Abdomen: soft, nontender, nondistended. Hypoactive bowel sounds. Extremities: no cyanosis, clubbing, rash, edema Neuro: intubated/sedated  Doing well immediately post-op. Swan numbers look good. Low PA pressures. Good output. On low-dose milrinone. Should be able to extubate today. Will follow hemodynamics. D/w Dr. Cyndia Bent at bedside.   Glori Bickers, MD  7:48 PM

## 2021-10-06 NOTE — OR Nursing (Signed)
Forty-five minute call to [employer] Nurse at 1210. Spoke to [place].

## 2021-10-06 NOTE — Anesthesia Procedure Notes (Signed)
Procedure Name: Intubation Date/Time: 10/06/2021 7:47 AM Performed by: Trinna Post., CRNA Pre-anesthesia Checklist: Patient identified, Emergency Drugs available, Suction available, Patient being monitored and Timeout performed Patient Re-evaluated:Patient Re-evaluated prior to induction Oxygen Delivery Method: Circle system utilized Preoxygenation: Pre-oxygenation with 100% oxygen Induction Type: IV induction Ventilation: Mask ventilation without difficulty and Oral airway inserted - appropriate to patient size Laryngoscope Size: Mac and 4 Grade View: Grade II Tube type: Oral Tube size: 7.5 mm Number of attempts: 1 Airway Equipment and Method: Stylet Placement Confirmation: ETT inserted through vocal cords under direct vision, positive ETCO2 and breath sounds checked- equal and bilateral Secured at: 22 cm Tube secured with: Tape Dental Injury: Teeth and Oropharynx as per pre-operative assessment

## 2021-10-06 NOTE — Op Note (Signed)
CARDIOVASCULAR SURGERY OPERATIVE NOTE  10/06/2021  Surgeon:  [name], MD  First Assistant: Gershon Crane, PA-C:   An experienced assistant was required given the complexity of this surgery and the standard of surgical care. The assistant was needed for exposure, dissection, suctioning, retraction of delicate tissues and sutures, instrument exchange and for overall help during this procedure.   Preoperative Diagnosis:  Severe multi-vessel coronary artery disease and severe LV systolic dysfunction.    Postoperative Diagnosis:  Same   Procedure:  Median Sternotomy Extracorporeal circulation 3.   Coronary artery bypass grafting x 5  Left internal mammary artery graft to the LAD SVG to diagonal Sequential SVG to OM1 and OM2 SVG to PDA 4.   Endoscopic vein harvest from the right leg   Anesthesia:  General Endotracheal   Clinical History/Surgical Indication:  This 60 year old gentleman has 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  I think that coronary artery bypass graft surgery is the best treatment for relief of his symptoms and to prevent further myocardial loss and worsening heart failure. I discussed the operative procedure with the patient and his sister including alternatives, benefits and risks; including but not limited to bleeding, blood transfusion, infection, stroke, myocardial infarction, graft failure, heart block requiring a permanent pacemaker, organ dysfunction, and death.  Brett Walker understands and agrees to proceed.    Preparation:  The patient was seen in the preoperative holding area and the correct patient, correct operation were confirmed with the patient after reviewing the medical record and catheterization. The consent was signed by me. Preoperative antibiotics  were given. A pulmonary arterial line and radial arterial line were placed by the anesthesia team. The patient was taken back to the operating room and positioned supine on the operating room table. After being placed under general endotracheal anesthesia by the anesthesia team a foley catheter was placed. The neck, chest, abdomen, and both legs were prepped with betadine soap and solution and draped in the usual sterile manner. A surgical time-out was taken and the correct patient and operative procedure were confirmed with the nursing and anesthesia staff.   Cardiopulmonary Bypass:  A median sternotomy was performed. The pericardium was opened in the midline. Right ventricular function appeared normal. The ascending aorta was of normal size and had no palpable plaque. There were no contraindications to aortic cannulation or cross-clamping. The patient was fully systemically heparinized and the ACT was maintained > 400 sec. The proximal aortic arch was cannulated with a 20 F aortic cannula for arterial inflow. Venous cannulation was performed via the right atrial appendage using a two-staged venous cannula. An antegrade cardioplegia/vent cannula was inserted into the mid-ascending aorta. Aortic occlusion was performed with a single cross-clamp. Systemic cooling to 32 degrees Centigrade and topical cooling of the heart with iced saline were used. Hyperkalemic antegrade cold blood cardioplegia was used to induce diastolic arrest and was then given at about 20 minute intervals throughout the period of arrest to maintain myocardial temperature at or below 10 degrees centigrade. A temperature probe was inserted into the interventricular septum and an insulating pad was placed in the pericardium.   Left internal mammary artery harvest:  The left side of the sternum was retracted using the Rultract retractor. The left internal mammary artery was harvested as a pedicle graft. All side branches were clipped. It  was a medium-sized vessel of good quality with excellent blood flow. It was ligated  distally and divided. It was sprayed with topical papaverine solution to prevent vasospasm.   Endoscopic vein harvest:  The right greater saphenous vein was harvested endoscopically through a 2 cm incision medial to the right knee. It was harvested from the upper thigh to below the knee. It was a medium-sized vein of good quality. The side branches were all ligated with 4-0 silk ties.    Coronary arteries:  The coronary arteries were examined.  LAD:  diffusely diseased but graftable distally. The diagonal was diffusely diseased but graftable distally. LCX:  OM1 and OM2  were small but graftable with no distal disease RCA:  Diffusely diseased with calcific plaque. The PDA was small but graftable. The PL branchs were small and diffusely diseased.   Grafts:  LIMA to the LAD: 1.75 mm. It was sewn end to side using 8-0 prolene continuous suture. SVG to diagonal:  1.5 mm. It was sewn end to side using 7-0 prolene continuous suture. Sequential SVG to OM1:  1.5 mm. It was sewn sequential side to side using 7-0 prolene continuous suture. Sequential SVG to OM2:  1.5 mm. It was sewn sequential end to side using 7-0 prolene continuous suture. SVG to PDA:  1.5 mm. It was sewn end to side using 7-0 prolene continuous suture.  The proximal vein graft anastomoses were performed to the mid-ascending aorta using continuous 6-0 prolene suture. Graft markers were placed around the proximal anastomoses.   Completion:  The patient was rewarmed to 37 degrees Centigrade. The clamp was removed from the LIMA pedicle and there was rapid warming of the septum and return of ventricular fibrillation. The crossclamp was removed with a time of 103 minutes. There was spontaneous return of sinus rhythm. The distal and proximal anastomoses were checked for hemostasis. The position of the grafts was satisfactory. Two temporary epicardial  pacing wires were placed on the right atrium and two on the right ventricle. The patient was weaned from CPB without difficulty on milrinone 0.125. CPB time was 124 minutes. Cardiac output was 5 LPM. TEE showed essentially unchanged LVEF of 25-30%. Heparin was fully reversed with protamine and the aortic and venous cannulas removed. He was coagulopathic and we gave a unit of platelets and ordered 2 units of FFP. Hemostasis was achieved. Mediastinal and bilateral pleural drainage tubes were placed. The sternum was closed with double #6 stainless steel wires. The fascia was closed with continuous # 1 vicryl suture. The subcutaneous tissue was closed with 2-0 vicryl continuous suture. The skin was closed with 3-0 vicryl subcuticular suture. All sponge, needle, and instrument counts were reported correct at the end of the case. Dry sterile dressings were placed over the incisions and around the chest tubes which were connected to pleurevac suction. The patient was then transported to the surgical intensive care unit in critical but stable condition.

## 2021-10-06 NOTE — Anesthesia Procedure Notes (Addendum)
Central Venous Catheter Insertion Performed by: Achille Rich, MD, anesthesiologist Start/End12/16/2022 7:12 AM, 10/06/2021 7:25 AM Patient location: Pre-op. Preanesthetic checklist: patient identified, IV checked, site marked, risks and benefits discussed, surgical consent, monitors and equipment checked, pre-op evaluation, timeout performed and anesthesia consent Lidocaine 1% used for infiltration and patient sedated Hand hygiene performed  and maximum sterile barriers used  Catheter size: 8.5 Fr Sheath introducer Procedure performed using ultrasound guided technique. Ultrasound Notes:anatomy identified, needle tip was noted to be adjacent to the nerve/plexus identified, no ultrasound evidence of intravascular and/or intraneural injection and image(s) printed for medical record Attempts: 1 Following insertion, line sutured and dressing applied. Post procedure assessment: blood return through all ports, free fluid flow and no air  Patient tolerated the procedure well with no immediate complications.

## 2021-10-06 NOTE — Progress Notes (Signed)
Patient ID: Brett Walker, male   DOB: [birth year], 60 y.o.   MRN: [id number]  TCTS Evening Rounds:   Hemodynamically stable  CI = 2.7  Has started to wake up on vent.   Urine output good  CT output has decreased the last couple hrs.  CBC    Component Value Date/Time   WBC 8.0 10/06/2021 1358   RBC 2.78 (L) 10/06/2021 1358   HGB 8.2 (L) 10/06/2021 1358   HCT 25.3 (L) 10/06/2021 1358   PLT 111 (L) 10/06/2021 1358   MCV 91.0 10/06/2021 1358   MCH 29.5 10/06/2021 1358   MCHC 32.4 10/06/2021 1358   RDW 12.9 10/06/2021 1358   LYMPHSABS 1.1 09/26/2021 2216   MONOABS 0.6 09/26/2021 2216   EOSABS 0.2 09/26/2021 2216   BASOSABS 0.1 09/26/2021 2216   Fibrinogen normal at 251  BMET    Component Value Date/Time   NA 138 10/06/2021 1223   K 4.5 10/06/2021 1223   CL 105 10/06/2021 1223   CO2 24 10/04/2021 0925   GLUCOSE 154 (H) 10/06/2021 1223   BUN 27 (H) 10/06/2021 1223   CREATININE 1.00 10/06/2021 1223   CALCIUM 9.2 10/04/2021 0925   GFRNONAA 56 (L) 10/04/2021 0925     A/P:  Stable postop course. Continue current plans. Wean to extubate.

## 2021-10-06 NOTE — Brief Op Note (Signed)
10/06/2021  6:59 AM  PATIENT:  Ritta Slot III  60 y.o. male  PRE-OPERATIVE DIAGNOSIS:  CAD  POST-OPERATIVE DIAGNOSIS:  CAD  PROCEDURE:  Procedure(s): CORONARY ARTERY BYPASS GRAFTING (CABG) x 5 ON CARDIOPULMONARY BYPASS USING LIMA AND RIGHT GREATER SAPHENOUS VEIN (N/A) TRANSESOPHAGEAL ECHOCARDIOGRAM (TEE) (N/A) APPLICATION OF CELL SAVER ENDOVEIN HARVEST OF GREATER SAPHENOUS VEIN (Right) LIMA-LAD SEQ SVG-OM1-OM2 SVG-PD SVG-DIAG EVH  45/15  SURGEON:  Surgeon(s) and Role:    * Bartle, Payton Doughty, MD - Primary  PHYSICIAN ASSISTANT: Forest Pruden PA-C  ASSISTANTS: STAFF   ANESTHESIA:   general  EBL:  500 mL   BLOOD ADMINISTERED: 1 UNIT PLTS  DRAINS:  BIL PLEURAL AND MEDIASINAL CHEST DRAINS    LOCAL MEDICATIONS USED:  NONE  SPECIMEN:  No Specimen  DISPOSITION OF SPECIMEN:  N/A  COUNTS:  YES  TOURNIQUET:  * No tourniquets in log *  DICTATION: .Dragon Dictation  PLAN OF CARE: Admit to inpatient   PATIENT DISPOSITION:  ICU - intubated and hemodynamically stable.   Delay start of Pharmacological VTE agent (>24hrs) due to surgical blood loss or risk of bleeding: yes  COMPLICATIONS: NO KNOWN

## 2021-10-06 NOTE — Hospital Course (Addendum)
HPI:    The patient is a 60 year old gentleman with a history of diabetes with peripheral neuropathy and retinopathy, hypertension, hyperlipidemia, and history of alcohol abuse who reports a 2-1/2-week history of substernal chest pain and shortness of breath that waxed and waned.  He said that it was fairly bad but he thought it would probably pass and may be related to the flu.  He then developed progressive lower extremity edema, orthopnea, and PND.  He called his PCP and was told to go to the emergency department.  D-dimer was 0.67.  He had a CTA of the chest which ruled out pulmonary embolism but did show moderate bilateral pleural effusions and mild vascular congestion.  High-sensitivity troponin was 55 and 56.  BNP was 1195.  He was negative for flu and COVID-19.  He was admitted for management of his acute heart failure.  2D echocardiogram on 09/27/2021 showed an ejection fraction of 30 to 35% with global hypokinesis and mild LVH.  There is grade 2 diastolic dysfunction.  Right ventricular systolic function was normal.  There was mild mitral regurgitation there is no aortic stenosis or insufficiency.  He underwent cardiac catheterization today showing severe multivessel coronary disease with ejection fraction of 25 to 35% by visual estimate.  Filling pressures were low and cardiac output was normal.   His family history is significant for his father having CABG at age 63 in the past 2 years.  The patient still works Electrical engineer.  He was a previous heavy alcohol user but reports minimal alcohol over the past few years.  He has never smoked.  Dr. Laneta Simmers evaluated the patient and all relevant studies and recommended proceeding with coronary artery surgical revascularization.  He was admitted this hospitalization for the procedure.  Hospital course:  The patient was admitted electively and taken the operating room on 10/06/2021 at which time he underwent CABG x5.  He tolerated the  procedure well was taken to the surgical intensive care unit in stable condition.  The patient was extubated the evening of surgery.  The patient was weaned off Milrinone as hemodynamics allowed.  His chest tubes and arterial lines were removed without difficulty. He was volume overloaded and will be initiated once stable off inotropic support.  His Milrinone was discontinued on 12/18 he was restarted on Coreg and Entresto as blood pressure allowed.  He developed a mild elevation in his creatinine level peaking at 1.79.  He was started on iron for expected post operative blood loss anemia. Advanced heart failure followed post op and medications were adjusted accordingly. Patient was felt surgically stable for transfer from the ICU to 4E for further convalescence on 12/19. Patient has been ambulating on room air with good oxygenation. He has been tolerating a diet and has had a bowel movement. Wounds are clean, dry, healing without signs of infection. Epicardial pacing wires were removed on 12/20.  The patient remained clinically stable. His medications have been adjusted by Advanced Heart failure.  He is medically stable for discharge home today.

## 2021-10-06 NOTE — Anesthesia Procedure Notes (Signed)
Arterial Line Insertion Start/End12/16/2022 7:00 AM Performed by: Laruth Bouchard., CRNA, CRNA  Patient location: Pre-op. Preanesthetic checklist: patient identified, IV checked, site marked, risks and benefits discussed, surgical consent, monitors and equipment checked, pre-op evaluation, timeout performed and anesthesia consent Lidocaine 1% used for infiltration Left, radial was placed Catheter size: 20 G Hand hygiene performed  and maximum sterile barriers used   Attempts: 1 Procedure performed without using ultrasound guided technique. Following insertion, dressing applied and Biopatch. Post procedure assessment: normal  Patient tolerated the procedure well with no immediate complications.

## 2021-10-06 NOTE — Progress Notes (Signed)
Pt titrated to 40%, rate dropped to 4 per wean protocol. Pt awake and alert. No resp distress noted at this time. Will continue to monitor.

## 2021-10-07 ENCOUNTER — Inpatient Hospital Stay: Payer: Self-pay

## 2021-10-07 ENCOUNTER — Inpatient Hospital Stay (HOSPITAL_COMMUNITY): Payer: BC Managed Care – PPO

## 2021-10-07 DIAGNOSIS — I5043 Acute on chronic combined systolic (congestive) and diastolic (congestive) heart failure: Secondary | ICD-10-CM

## 2021-10-07 LAB — POCT I-STAT 7, (LYTES, BLD GAS, ICA,H+H)
Acid-base deficit: 5 mmol/L — ABNORMAL HIGH (ref 0.0–2.0)
Acid-base deficit: 3 mmol/L — ABNORMAL HIGH (ref 0.0–2.0)
Acid-base deficit: 2 mmol/L (ref 0.0–2.0)
Bicarbonate: 20.9 mmol/L (ref 20.0–28.0)
Bicarbonate: 21.4 mmol/L (ref 20.0–28.0)
Bicarbonate: 23.5 mmol/L (ref 20.0–28.0)
Calcium, Ion: 1.14 mmol/L — ABNORMAL LOW (ref 1.15–1.40)
Calcium, Ion: 1.13 mmol/L — ABNORMAL LOW (ref 1.15–1.40)
Calcium, Ion: 1.14 mmol/L — ABNORMAL LOW (ref 1.15–1.40)
HCT: 24 % — ABNORMAL LOW (ref 39.0–52.0)
HCT: 25 % — ABNORMAL LOW (ref 39.0–52.0)
HCT: 26 % — ABNORMAL LOW (ref 39.0–52.0)
Hemoglobin: 8.2 g/dL — ABNORMAL LOW (ref 13.0–17.0)
Hemoglobin: 8.5 g/dL — ABNORMAL LOW (ref 13.0–17.0)
Hemoglobin: 8.8 g/dL — ABNORMAL LOW (ref 13.0–17.0)
O2 Saturation: 97 %
O2 Saturation: 99 %
O2 Saturation: 97 %
Patient temperature: 35.8
Patient temperature: 38
Patient temperature: 38
Potassium: 4.1 mmol/L (ref 3.5–5.1)
Potassium: 4.7 mmol/L (ref 3.5–5.1)
Potassium: 4.7 mmol/L (ref 3.5–5.1)
Sodium: 141 mmol/L (ref 135–145)
Sodium: 139 mmol/L (ref 135–145)
Sodium: 138 mmol/L (ref 135–145)
TCO2: 22 mmol/L (ref 22–32)
TCO2: 22 mmol/L (ref 22–32)
TCO2: 25 mmol/L (ref 22–32)
pCO2 arterial: 38.4 mmHg (ref 32.0–48.0)
pCO2 arterial: 35.5 mmHg (ref 32.0–48.0)
pCO2 arterial: 41.7 mmHg (ref 32.0–48.0)
pH, Arterial: 7.339 — ABNORMAL LOW (ref 7.350–7.450)
pH, Arterial: 7.392 (ref 7.350–7.450)
pH, Arterial: 7.364 (ref 7.350–7.450)
pO2, Arterial: 97 mmHg (ref 83.0–108.0)
pO2, Arterial: 129 mmHg — ABNORMAL HIGH (ref 83.0–108.0)
pO2, Arterial: 101 mmHg (ref 83.0–108.0)

## 2021-10-07 LAB — BASIC METABOLIC PANEL
Anion gap: 6 (ref 5–15)
Anion gap: 6 (ref 5–15)
BUN: 26 mg/dL — ABNORMAL HIGH (ref 6–20)
BUN: 30 mg/dL — ABNORMAL HIGH (ref 6–20)
CO2: 21 mmol/L — ABNORMAL LOW (ref 22–32)
CO2: 23 mmol/L (ref 22–32)
Calcium: 7.6 mg/dL — ABNORMAL LOW (ref 8.9–10.3)
Calcium: 7.9 mg/dL — ABNORMAL LOW (ref 8.9–10.3)
Chloride: 110 mmol/L (ref 98–111)
Chloride: 107 mmol/L (ref 98–111)
Creatinine, Ser: 1.33 mg/dL — ABNORMAL HIGH (ref 0.61–1.24)
Creatinine, Ser: 1.79 mg/dL — ABNORMAL HIGH (ref 0.61–1.24)
GFR, Estimated: >60 mL/min (ref >60)
GFR, Estimated: 43 mL/min — ABNORMAL LOW (ref >60)
Glucose, Bld: 114 mg/dL — ABNORMAL HIGH (ref 70–99)
Glucose, Bld: 171 mg/dL — ABNORMAL HIGH (ref 70–99)
Potassium: 4.5 mmol/L (ref 3.5–5.1)
Potassium: 4.6 mmol/L (ref 3.5–5.1)
Sodium: 137 mmol/L (ref 135–145)
Sodium: 136 mmol/L (ref 135–145)

## 2021-10-07 LAB — PREPARE FRESH FROZEN PLASMA
Unit division: 0
Unit division: 0

## 2021-10-07 LAB — BPAM PLATELET PHERESIS
Blood Product Expiration Date: 202212172359
ISSUE DATE / TIME: 202212161140
Unit Type and Rh: 5100

## 2021-10-07 LAB — BPAM FFP
Blood Product Expiration Date: 202212172359
Blood Product Expiration Date: 202212202359
ISSUE DATE / TIME: 202212161331
ISSUE DATE / TIME: 202212161331
Unit Type and Rh: 6200
Unit Type and Rh: 6200

## 2021-10-07 LAB — CBC
HCT: 24.9 % — ABNORMAL LOW (ref 39.0–52.0)
HCT: 26 % — ABNORMAL LOW (ref 39.0–52.0)
Hemoglobin: 8.2 g/dL — ABNORMAL LOW (ref 13.0–17.0)
Hemoglobin: 8.3 g/dL — ABNORMAL LOW (ref 13.0–17.0)
MCH: 30.1 pg (ref 26.0–34.0)
MCH: 29.9 pg (ref 26.0–34.0)
MCHC: 32.9 g/dL (ref 30.0–36.0)
MCHC: 31.9 g/dL (ref 30.0–36.0)
MCV: 91.5 fL (ref 80.0–100.0)
MCV: 93.5 fL (ref 80.0–100.0)
Platelets: 97 10*3/uL — ABNORMAL LOW (ref 150–400)
Platelets: 104 10*3/uL — ABNORMAL LOW (ref 150–400)
RBC: 2.72 MIL/uL — ABNORMAL LOW (ref 4.22–5.81)
RBC: 2.78 MIL/uL — ABNORMAL LOW (ref 4.22–5.81)
RDW: 13.2 % (ref 11.5–15.5)
RDW: 13.4 % (ref 11.5–15.5)
WBC: 8.7 10*3/uL (ref 4.0–10.5)
WBC: 8.9 10*3/uL (ref 4.0–10.5)
nRBC: 0 % (ref 0.0–0.2)
nRBC: 0 % (ref 0.0–0.2)

## 2021-10-07 LAB — PREPARE PLATELET PHERESIS: Unit division: 0

## 2021-10-07 LAB — GLUCOSE, CAPILLARY
Glucose-Capillary: 103 mg/dL — ABNORMAL HIGH (ref 70–99)
Glucose-Capillary: 106 mg/dL — ABNORMAL HIGH (ref 70–99)
Glucose-Capillary: 109 mg/dL — ABNORMAL HIGH (ref 70–99)
Glucose-Capillary: 114 mg/dL — ABNORMAL HIGH (ref 70–99)
Glucose-Capillary: 117 mg/dL — ABNORMAL HIGH (ref 70–99)
Glucose-Capillary: 118 mg/dL — ABNORMAL HIGH (ref 70–99)
Glucose-Capillary: 124 mg/dL — ABNORMAL HIGH (ref 70–99)
Glucose-Capillary: 132 mg/dL — ABNORMAL HIGH (ref 70–99)
Glucose-Capillary: 136 mg/dL — ABNORMAL HIGH (ref 70–99)
Glucose-Capillary: 139 mg/dL — ABNORMAL HIGH (ref 70–99)
Glucose-Capillary: 179 mg/dL — ABNORMAL HIGH (ref 70–99)
Glucose-Capillary: 185 mg/dL — ABNORMAL HIGH (ref 70–99)

## 2021-10-07 LAB — TYPE AND SCREEN
ABO/RH(D): A POS
Antibody Screen: NEGATIVE
Unit division: 0

## 2021-10-07 LAB — MAGNESIUM
Magnesium: 2.7 mg/dL — ABNORMAL HIGH (ref 1.7–2.4)
Magnesium: 2.7 mg/dL — ABNORMAL HIGH (ref 1.7–2.4)

## 2021-10-07 LAB — COOXEMETRY PANEL
Carboxyhemoglobin: 2 % — ABNORMAL HIGH (ref 0.5–1.5)
Methemoglobin: 0.9 % (ref 0.0–1.5)
O2 Saturation: 74.5 %
Total hemoglobin: 8.1 g/dL — ABNORMAL LOW (ref 12.0–16.0)

## 2021-10-07 LAB — BPAM RBC
Blood Product Expiration Date: 202301172359
ISSUE DATE / TIME: 202212161835
Unit Type and Rh: 6200

## 2021-10-07 MED ORDER — SODIUM CHLORIDE 0.9% FLUSH: 10.0000 mL | INTRAVENOUS | Status: DC | PRN

## 2021-10-07 MED ORDER — INSULIN ASPART 100 UNIT/ML IJ SOLN
0.0000 [IU] | INTRAMUSCULAR | Status: DC
  Administered 2021-10-07 (×2): 2 [IU] via SUBCUTANEOUS
  Administered 2021-10-07: 4 [IU] via SUBCUTANEOUS
  Administered 2021-10-07: 2 [IU] via SUBCUTANEOUS

## 2021-10-07 MED ORDER — FE FUMARATE-B12-VIT C-FA-IFC PO CAPS
1.0000 | ORAL_CAPSULE | Freq: Two times a day (BID) | ORAL | Status: DC
  Administered 2021-10-07 – 2021-10-10 (×6): 1 via ORAL
  Filled 2021-10-07 (×6): qty 1

## 2021-10-07 MED ORDER — ORAL CARE MOUTH RINSE: 15.0000 mL | Freq: Two times a day (BID) | OROMUCOSAL | Status: DC

## 2021-10-07 MED ORDER — ORAL CARE MOUTH RINSE
15.0000 mL | Freq: Two times a day (BID) | OROMUCOSAL | Status: DC
  Administered 2021-10-07 – 2021-10-09 (×3): 15 mL via OROMUCOSAL

## 2021-10-07 MED ORDER — FUROSEMIDE 10 MG/ML IJ SOLN
60.0000 mg | Freq: Once | INTRAMUSCULAR | Status: AC
  Administered 2021-10-07: 60 mg via INTRAVENOUS
  Filled 2021-10-07: qty 6

## 2021-10-07 MED ORDER — SODIUM CHLORIDE 0.9% FLUSH
10.0000 mL | Freq: Two times a day (BID) | INTRAVENOUS | Status: DC
  Administered 2021-10-07: 40 mL
  Administered 2021-10-07: 10 mL
  Administered 2021-10-08: 22:00:00 20 mL
  Administered 2021-10-08: 09:00:00 40 mL
  Administered 2021-10-09 – 2021-10-10 (×3): 10 mL

## 2021-10-07 MED ORDER — PRAVASTATIN SODIUM 40 MG PO TABS
40.0000 mg | ORAL_TABLET | Freq: Every day | ORAL | Status: DC
  Administered 2021-10-07 – 2021-10-09 (×3): 40 mg via ORAL
  Filled 2021-10-07 (×3): qty 1

## 2021-10-07 MED ORDER — INSULIN DETEMIR 100 UNIT/ML ~~LOC~~ SOLN
20.0000 [IU] | Freq: Every day | SUBCUTANEOUS | Status: DC
  Administered 2021-10-07 – 2021-10-10 (×4): 20 [IU] via SUBCUTANEOUS
  Filled 2021-10-07 (×4): qty 0.2

## 2021-10-07 MED ORDER — INSULIN DETEMIR 100 UNIT/ML ~~LOC~~ SOLN: 20.0000 [IU] | Freq: Every day | SUBCUTANEOUS | Status: DC

## 2021-10-07 NOTE — Progress Notes (Signed)
1 Day Post-Op Procedure(s) (LRB): CORONARY ARTERY BYPASS GRAFTING (CABG) x 5 ON CARDIOPULMONARY BYPASS USING LIMA AND RIGHT GREATER SAPHENOUS VEIN (N/A) TRANSESOPHAGEAL ECHOCARDIOGRAM (TEE) (N/A) APPLICATION OF CELL SAVER ENDOVEIN HARVEST OF GREATER SAPHENOUS VEIN (Right) Subjective: No complaints.  Objective: Vital signs in last 24 hours: Temp:  [96.1 F (35.6 C)-101.3 F (38.5 C)] 99.3 F (37.4 C) (12/17 0715) Pulse Rate:  [87-114] 93 (12/17 0715) Cardiac Rhythm: Normal sinus rhythm;Sinus tachycardia (12/17 0400) Resp:  [12-32] 19 (12/17 0715) BP: (76-127)/(36-74) 93/56 (12/17 0700) SpO2:  [95 %-100 %] 98 % (12/17 0715) Arterial Line BP: (75-179)/(35-120) 95/47 (12/17 0715) FiO2 (%):  [40 %-50 %] 40 % (12/16 2102) Weight:  [94.3 kg] 94.3 kg (12/17 0500)  Hemodynamic parameters for last 24 hours: PAP: (21-50)/(9-24) 31/9 CO:  [4.6 L/min-7.6 L/min] 6.3 L/min CI:  [2.2 L/min/m2-3.7 L/min/m2] 3.1 L/min/m2  Intake/Output from previous day: 12/16 0701 - 12/17 0700 In: 6194.6 [I.V.:2701.1; Blood:1383; IV Piggyback:2110.6] Out: 3685 [Urine:2005; Emesis/NG output:100; Blood:500; Chest Tube:1080] Intake/Output this shift: No intake/output data recorded.  General appearance: alert and cooperative Neurologic: intact Heart: regular rate and rhythm, S1, S2 normal, no murmur Lungs: clear to auscultation bilaterally Extremities: edema mild Wound: dressings dry  Lab Results: Recent Labs    10/06/21 1358 10/06/21 1401 10/06/21 2328 10/07/21 0155  WBC 8.0  --   --  8.7  HGB 8.2*   < > 8.8* 8.2*  HCT 25.3*   < > 26.0* 24.9*  PLT 111*  --   --  97*   < > = values in this interval not displayed.   BMET:  Recent Labs    10/04/21 0925 10/06/21 0802 10/06/21 1223 10/06/21 1401 10/06/21 2328 10/07/21 0155  NA 137   < > 138   < > 138 137  K 4.4   < > 4.5   < > 4.7 4.5  CL 103   < > 105  --   --  110  CO2 24  --   --   --   --  21*  GLUCOSE 180*   < > 154*  --   --  114*   BUN 38*   < > 27*  --   --  26*  CREATININE 1.45*   < > 1.00  --   --  1.33*  CALCIUM 9.2  --   --   --   --  7.6*   < > = values in this interval not displayed.    PT/INR:  Recent Labs    10/06/21 1358  LABPROT 17.7*  INR 1.5*   ABG    Component Value Date/Time   PHART 7.364 10/06/2021 2328   HCO3 23.5 10/06/2021 2328   TCO2 25 10/06/2021 2328   ACIDBASEDEF 2.0 10/06/2021 2328   O2SAT 74.5 10/07/2021 0622   CBG (last 3)  Recent Labs    10/07/21 0455 10/07/21 0607 10/07/21 0644  GLUCAP 103* 132* 109*   CXR: Clear  ECG: sinus rhythm, no acute changes  Assessment/Plan: S/P Procedure(s) (LRB): CORONARY ARTERY BYPASS GRAFTING (CABG) x 5 ON CARDIOPULMONARY BYPASS USING LIMA AND RIGHT GREATER SAPHENOUS VEIN (N/A) TRANSESOPHAGEAL ECHOCARDIOGRAM (TEE) (N/A) APPLICATION OF CELL SAVER ENDOVEIN HARVEST OF GREATER SAPHENOUS VEIN (Right)  POD 1 Hemodynamically stable on milrinone 0.125. Co-ox 74.5 and CI 3.1. Will continue today and plan to stop tomorrow if hemodynamics remain stable. Preop EF 25%. Resume heart failure meds as BP allows.  Volume excess: Wt is 8 lbs over preop. Will wait  to start diuresis since SBP 90's.  DM: glucose under good control Transition to Levemir and SSI.  DC chest tubes, swan, arterial line.  IS, OOB.   LOS: 1 day    [name] 10/07/2021

## 2021-10-07 NOTE — Progress Notes (Signed)
Patient ID: Brett Walker, male   DOB: 07-13-1961, 60 y.o.   MRN: [id number] TCTS Evening Rounds:  Hemodynamically stable in sinus rhythm. Milrinone 0.125  UO ok after lasix today.  Had PICC line placed so will remove sleeve.  Continue IS, ambulation  Pm labs pending.

## 2021-10-07 NOTE — Plan of Care (Signed)
°  Problem: Education: Goal: Knowledge of General Education information will improve Description: Including pain rating scale, medication(s)/side effects and non-pharmacologic comfort measures Outcome: Progressing   Problem: Health Behavior/Discharge Planning: Goal: Ability to manage health-related needs will improve Outcome: Progressing   Problem: Clinical Measurements: Goal: Ability to maintain clinical measurements within normal limits will improve Outcome: Progressing Goal: Will remain free from infection Outcome: Progressing Goal: Diagnostic test results will improve Outcome: Progressing Goal: Respiratory complications will improve Outcome: Progressing Goal: Cardiovascular complication will be avoided Outcome: Progressing   Problem: Activity: Goal: Risk for activity intolerance will decrease Outcome: Progressing   Problem: Nutrition: Goal: Adequate nutrition will be maintained Outcome: Progressing   Problem: Coping: Goal: Level of anxiety will decrease Outcome: Progressing   Problem: Elimination: Goal: Will not experience complications related to bowel motility Outcome: Progressing   Problem: Pain Managment: Goal: General experience of comfort will improve Outcome: Progressing   Problem: Safety: Goal: Ability to remain free from injury will improve Outcome: Progressing   Problem: Skin Integrity: Goal: Risk for impaired skin integrity will decrease Outcome: Progressing   Problem: Activity: Goal: Risk for activity intolerance will decrease Outcome: Progressing   Problem: Cardiac: Goal: Will achieve and/or maintain hemodynamic stability Outcome: Progressing   Problem: Clinical Measurements: Goal: Postoperative complications will be avoided or minimized Outcome: Progressing   Problem: Respiratory: Goal: Respiratory status will improve Outcome: Progressing   Problem: Skin Integrity: Goal: Wound healing without signs and symptoms of infection Outcome:  Progressing Goal: Risk for impaired skin integrity will decrease Outcome: Progressing   Problem: Urinary Elimination: Goal: Ability to achieve and maintain adequate renal perfusion and functioning will improve Outcome: Progressing

## 2021-10-07 NOTE — Progress Notes (Signed)
Patient ID: Brett Walker, male   DOB: 06/19/1961, 60 y.o.   MRN: [id number]     Advanced Heart Failure Rounding Note  PCP-Cardiologist: None   Subjective:    Mild surgical site pain, o/w no complaints.    SBP 100s, he is on milrinone 0.125. Weight up post-CABG.   Swan: 30/15 CI 3.1 Co-ox 75%   Objective:   Weight Range: 94.3 kg Body mass index is 30.7 kg/m.   Vital Signs:   Temp:  [96.1 F (35.6 C)-101.3 F (38.5 C)] 99.3 F (37.4 C) (12/17 0830) Pulse Rate:  [87-114] 92 (12/17 1030) Resp:  [9-32] 16 (12/17 1030) BP: (76-127)/(36-74) 93/61 (12/17 1000) SpO2:  [94 %-100 %] 99 % (12/17 1030) Arterial Line BP: (75-179)/(35-120) 118/55 (12/17 1030) FiO2 (%):  [40 %-50 %] 40 % (12/16 2102) Weight:  [94.3 kg] 94.3 kg (12/17 0500) Last BM Date: 09/30/21  Weight change: Filed Weights   10/06/21 0608 10/07/21 0500  Weight: 90.7 kg 94.3 kg    Intake/Output:   Intake/Output Summary (Last 24 hours) at 10/07/2021 1103 Last data filed at 10/07/2021 1015 Gross per 24 hour  Intake 4960.23 ml  Output 3680 ml  Net 1280.23 ml      Physical Exam    General: NAD Neck: JVP 10-12 cm, no thyromegaly or thyroid nodule.  Lungs: Clear to auscultation bilaterally with normal respiratory effort. CV: Nondisplaced PMI.  Heart regular S1/S2, no S3/S4, no murmur.  No peripheral edema.   Abdomen: Soft, nontender, no hepatosplenomegaly, no distention.  Skin: Intact without lesions or rashes.  Neurologic: Alert and oriented x 3.  Psych: Normal affect. Extremities: No clubbing or cyanosis.  HEENT: Normal.    Telemetry   NSR 90s  Labs    CBC Recent Labs    10/06/21 1358 10/06/21 1401 10/06/21 2328 10/07/21 0155  WBC 8.0  --   --  8.7  HGB 8.2*   < > 8.8* 8.2*  HCT 25.3*   < > 26.0* 24.9*  MCV 91.0  --   --  91.5  PLT 111*  --   --  97*   < > = values in this interval not displayed.   Basic Metabolic Panel Recent Labs    10/06/21 1223 10/06/21 1401  10/06/21 2328 10/07/21 0155  NA 138   < > 138 137  K 4.5   < > 4.7 4.5  CL 105  --   --  110  CO2  --   --   --  21*  GLUCOSE 154*  --   --  114*  BUN 27*  --   --  26*  CREATININE 1.00  --   --  1.33*  CALCIUM  --   --   --  7.6*  MG  --   --   --  2.7*   < > = values in this interval not displayed.   Liver Function Tests No results for input(s): AST, ALT, ALKPHOS, BILITOT, PROT, ALBUMIN in the last 72 hours. No results for input(s): LIPASE, AMYLASE in the last 72 hours. Cardiac Enzymes No results for input(s): CKTOTAL, CKMB, CKMBINDEX, TROPONINI in the last 72 hours.  BNP: BNP (last 3 results) Recent Labs    09/26/21 2216  BNP 1,194.7*    ProBNP (last 3 results) No results for input(s): PROBNP in the last 8760 hours.   D-Dimer No results for input(s): DDIMER in the last 72 hours. Hemoglobin A1C No results for input(s): HGBA1C in the  last 72 hours. Fasting Lipid Panel No results for input(s): CHOL, HDL, LDLCALC, TRIG, CHOLHDL, LDLDIRECT in the last 72 hours. Thyroid Function Tests No results for input(s): TSH, T4TOTAL, T3FREE, THYROIDAB in the last 72 hours.  Invalid input(s): FREET3  Other results:   Imaging    DG Chest Port 1 View  Result Date: 10/07/2021 CLINICAL DATA:  Follow-up exam.  Status post CABG surgery. EXAM: PORTABLE CHEST 1 VIEW COMPARISON:  10/06/2021 and older studies. FINDINGS: Endotracheal tube and nasal/orogastric tube have been removed since the prior study. Right internal jugular Swan-Ganz catheter, mediastinal tube and bilateral chest tubes are stable. No mediastinal widening.  Cardiac silhouette normal in size. Mild improvement in left lung base opacity consistent with improved atelectasis, decreased pleural fluid. Mild medial right lung base atelectasis is stable. Remainder of the lungs is clear. No pneumothorax. IMPRESSION: 1. No acute findings or evidence of an operative complication. 2. Mild improvement in left lung base  atelectasis/pleural fluid. Mild persistent medial right lung base atelectasis. 3. Status post extubation and removal of the nasal/orogastric tube. Remaining support apparatus is stable and well positioned. Electronically Signed   By: Lajean Manes M.D.   On: 10/07/2021 09:10   DG Chest Port 1 View  Result Date: 10/06/2021 CLINICAL DATA:  Status post CABG x5. EXAM: PORTABLE CHEST 1 VIEW COMPARISON:  09/30/2021 FINDINGS: Interval median sternotomy for CABG. Endotracheal tube terminates 5.4 cm above carina. Nasogastric tube terminates in the body of the stomach with the side port just below the gastroesophageal junction. Right internal jugular Swan-Ganz catheter tip at pulmonary outflow tract. Bilateral chest tubes. Normal heart size for level of inspiration. Probable small left pleural effusion. No pneumothorax. Low lung volumes with resultant pulmonary interstitial prominence. Left base atelectasis. IMPRESSION: Expected appearance after median sternotomy for CABG. Appropriate position of support apparatus with trace left pleural fluid and adjacent left base atelectasis. No pneumothorax or other acute complication. Electronically Signed   By: Abigail Miyamoto M.D.   On: 10/06/2021 14:00     Medications:     Scheduled Medications:  acetaminophen  1,000 mg Oral Q6H   Or   acetaminophen (TYLENOL) oral liquid 160 mg/5 mL  1,000 mg Per Tube Q6H   aspirin EC  325 mg Oral Daily   Or   aspirin  324 mg Per Tube Daily   bisacodyl  10 mg Oral Daily   Or   bisacodyl  10 mg Rectal Daily   chlorhexidine gluconate (MEDLINE KIT)  15 mL Mouth Rinse BID   Chlorhexidine Gluconate Cloth  6 each Topical Daily   docusate sodium  200 mg Oral Daily   furosemide  60 mg Intravenous Once   insulin aspart  0-24 Units Subcutaneous Q4H   insulin detemir  20 Units Subcutaneous Daily   mouth rinse  15 mL Mouth Rinse BID   [START ON 10/08/2021] pantoprazole  40 mg Oral Daily   pravastatin  40 mg Oral q1800   sodium chloride  flush  10-40 mL Intracatheter Q12H   sodium chloride flush  3 mL Intravenous Q12H    Infusions:  sodium chloride Stopped (10/06/21 2012)   sodium chloride     sodium chloride 10 mL/hr at 10/06/21 1406   lactated ringers     lactated ringers 20 mL/hr at 10/07/21 1000   milrinone 0.125 mcg/kg/min (10/07/21 1000)   nitroGLYCERIN Stopped (10/07/21 0441)   phenylephrine (NEO-SYNEPHRINE) Adult infusion Stopped (10/07/21 0918)    PRN Medications: sodium chloride, dextrose, morphine injection, ondansetron (ZOFRAN)  IV, oxyCODONE, sodium chloride flush, sodium chloride flush, traMADol    Assessment/Plan   Multivessel CAD - s/p CABG x 5 (LIMA-LAD, SVG-Diag, sequential SVG-OM/OM2 and SVG-PDA) on 12/16.  - ASA - Restart pravastatin 40 mg daily.    2. 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 MAPs 80s. No pressor requirements currently  - CI 3.1 off Swan with co-ox 75%.  Continue milrinone 0.125 today, probably stop tomorrow.   - Remove Swan/introducer place PICC. Follow CVP/co-ox.  - Weight up, volume overload on exam. Lasix 60 mg IV x 1.    3. ABLA   - expected surgical blood loss - Hgb 8.2 - transfusion threshold per CT surgery    4. Type 2DM  - SSI  Mobilize.   CRITICAL CARE Performed by: Loralie Champagne  Total critical care time: 35 minutes  Critical care time was exclusive of separately billable procedures and treating other patients.  Critical care was necessary to treat or prevent imminent or life-threatening deterioration.  Critical care was time spent personally by me on the following activities: development of treatment plan with patient and/or surrogate as well as nursing, discussions with consultants, evaluation of patient's response to treatment, examination of patient, obtaining history from patient or surrogate, ordering  and performing treatments and interventions, ordering and review of laboratory studies, ordering and review of radiographic studies, pulse oximetry and re-evaluation of patient's condition.    Length of Stay: 1  Loralie Champagne, MD  10/07/2021, 11:03 AM  Advanced Heart Failure Team Pager [phone] (M-F; 7a - 5p)  Please contact [facility] for night-coverage after hours (5p -7a ) and weekends on [email or web address]

## 2021-10-07 NOTE — Progress Notes (Signed)
Peripherally Inserted Central Catheter Placement  The IV Nurse has discussed with the patient and/or persons authorized to consent for the patient, the purpose of this procedure and the potential benefits and risks involved with this procedure.  The benefits include less needle sticks, lab draws from the catheter, and the patient may be discharged home with the catheter. Risks include, but not limited to, infection, bleeding, blood clot (thrombus formation), and puncture of an artery; nerve damage and irregular heartbeat and possibility to perform a PICC exchange if needed/ordered by physician.  Alternatives to this procedure were also discussed.  Bard Power PICC patient education guide, fact sheet on infection prevention and patient information card has been provided to patient /or left at bedside.    PICC Placement Documentation  PICC Double Lumen 10/07/21 PICC Right Cephalic 38 cm 0 cm (Active)  Indication for Insertion or Continuance of Line Vasoactive infusions;Chronic illness with exacerbations (CF, Sickle Cell, etc.) 10/07/21 1336  Exposed Catheter (cm) 0 cm 10/07/21 1336  Site Assessment Clean;Dry;Intact 10/07/21 1336  Lumen #1 Status Flushed;Saline locked;Blood return noted 10/07/21 1336  Lumen #2 Status Flushed;Saline locked;Blood return noted 10/07/21 1336  Dressing Type Transparent 10/07/21 1336  Dressing Status Clean;Dry;Intact 10/07/21 1336  Antimicrobial disc in place? Yes 10/07/21 1336  Safety Lock Not Applicable 10/07/21 1336  Line Care Connections checked and tightened 10/07/21 1336  Line Adjustment (NICU/IV Team Only) No 10/07/21 1336  Dressing Intervention New dressing 10/07/21 1336  Dressing Change Due 10/14/21 10/07/21 1336       Elliot Dally 10/07/2021, 1:37 PM

## 2021-10-08 ENCOUNTER — Inpatient Hospital Stay (HOSPITAL_COMMUNITY): Payer: BC Managed Care – PPO

## 2021-10-08 LAB — CBC
HCT: 23 % — ABNORMAL LOW (ref 39.0–52.0)
Hemoglobin: 7.5 g/dL — ABNORMAL LOW (ref 13.0–17.0)
MCH: 30.4 pg (ref 26.0–34.0)
MCHC: 32.6 g/dL (ref 30.0–36.0)
MCV: 93.1 fL (ref 80.0–100.0)
Platelets: 91 10*3/uL — ABNORMAL LOW (ref 150–400)
RBC: 2.47 MIL/uL — ABNORMAL LOW (ref 4.22–5.81)
RDW: 13.4 % (ref 11.5–15.5)
WBC: 7.5 10*3/uL (ref 4.0–10.5)
nRBC: 0 % (ref 0.0–0.2)

## 2021-10-08 LAB — BASIC METABOLIC PANEL
Anion gap: 5 (ref 5–15)
BUN: 32 mg/dL — ABNORMAL HIGH (ref 6–20)
CO2: 25 mmol/L (ref 22–32)
Calcium: 8 mg/dL — ABNORMAL LOW (ref 8.9–10.3)
Chloride: 108 mmol/L (ref 98–111)
Creatinine, Ser: 1.58 mg/dL — ABNORMAL HIGH (ref 0.61–1.24)
GFR, Estimated: 50 mL/min — ABNORMAL LOW (ref >60)
Glucose, Bld: 97 mg/dL (ref 70–99)
Potassium: 4.3 mmol/L (ref 3.5–5.1)
Sodium: 138 mmol/L (ref 135–145)

## 2021-10-08 LAB — COOXEMETRY PANEL
Carboxyhemoglobin: 2 % — ABNORMAL HIGH (ref 0.5–1.5)
Methemoglobin: 0.9 % (ref 0.0–1.5)
O2 Saturation: 72.4 %
Total hemoglobin: 7.3 g/dL — ABNORMAL LOW (ref 12.0–16.0)

## 2021-10-08 LAB — GLUCOSE, CAPILLARY
Glucose-Capillary: 90 mg/dL (ref 70–99)
Glucose-Capillary: 100 mg/dL — ABNORMAL HIGH (ref 70–99)
Glucose-Capillary: 145 mg/dL — ABNORMAL HIGH (ref 70–99)
Glucose-Capillary: 117 mg/dL — ABNORMAL HIGH (ref 70–99)

## 2021-10-08 MED ORDER — TRAMADOL HCL 50 MG PO TABS
50.0000 mg | ORAL_TABLET | ORAL | Status: DC | PRN
  Administered 2021-10-09: 09:00:00 50 mg via ORAL
  Filled 2021-10-08: qty 1

## 2021-10-08 MED ORDER — INSULIN ASPART 100 UNIT/ML IJ SOLN
0.0000 [IU] | Freq: Three times a day (TID) | INTRAMUSCULAR | Status: DC
  Administered 2021-10-08 – 2021-10-09 (×2): 2 [IU] via SUBCUTANEOUS
  Administered 2021-10-10: 13:00:00 4 [IU] via SUBCUTANEOUS

## 2021-10-08 NOTE — Progress Notes (Signed)
2 Days Post-Op Procedure(s) (LRB): CORONARY ARTERY BYPASS GRAFTING (CABG) x 5 ON CARDIOPULMONARY BYPASS USING LIMA AND RIGHT GREATER SAPHENOUS VEIN (N/A) TRANSESOPHAGEAL ECHOCARDIOGRAM (TEE) (N/A) APPLICATION OF CELL SAVER ENDOVEIN HARVEST OF GREATER SAPHENOUS VEIN (Right) Subjective: No complaints. Ambulated this am.  Co-ox 72.4 on milrinone 0.125.  Objective: Vital signs in last 24 hours: Temp:  [98.1 F (36.7 C)-99.3 F (37.4 C)] 98.3 F (36.8 C) (12/18 0400) Pulse Rate:  [88-100] 90 (12/18 0700) Cardiac Rhythm: Normal sinus rhythm (12/17 2000) Resp:  [9-24] 15 (12/18 0700) BP: (93-130)/(54-68) 130/68 (12/18 0648) SpO2:  [91 %-100 %] 94 % (12/18 0700) Arterial Line BP: (98-145)/(48-61) 145/61 (12/17 1100) Weight:  [94.7 kg] 94.7 kg (12/18 0545)  Hemodynamic parameters for last 24 hours: PAP: (32-51)/(9-18) 51/18 CVP:  [0 mmHg-6 mmHg] 0 mmHg  Intake/Output from previous day: 12/17 0701 - 12/18 0700 In: 1673.8 [P.O.:1030; I.V.:493.7; IV Piggyback:150] Out: 1300 [Urine:1210; Chest Tube:90] Intake/Output this shift: No intake/output data recorded.  General appearance: alert and cooperative Neurologic: intact Heart: regular rate and rhythm, S1, S2 normal, no murmur Lungs: clear to auscultation bilaterally Extremities: edema minimal Wound: incision healing well  Lab Results: Recent Labs    10/07/21 1642 10/08/21 0401  WBC 8.9 7.5  HGB 8.3* 7.5*  HCT 26.0* 23.0*  PLT 104* 91*   BMET:  Recent Labs    10/07/21 1642 10/08/21 0401  NA 136 138  K 4.6 4.3  CL 107 108  CO2 23 25  GLUCOSE 171* 97  BUN 30* 32*  CREATININE 1.79* 1.58*  CALCIUM 7.9* 8.0*    PT/INR:  Recent Labs    10/06/21 1358  LABPROT 17.7*  INR 1.5*   ABG    Component Value Date/Time   PHART 7.364 10/06/2021 2328   HCO3 23.5 10/06/2021 2328   TCO2 25 10/06/2021 2328   ACIDBASEDEF 2.0 10/06/2021 2328   O2SAT 72.4 10/08/2021 0401   CBG (last 3)  Recent Labs    10/07/21 2341  10/08/21 0404 10/08/21 0646  GLUCAP 139* 90 100*   CXR: clear  Assessment/Plan: S/P Procedure(s) (LRB): CORONARY ARTERY BYPASS GRAFTING (CABG) x 5 ON CARDIOPULMONARY BYPASS USING LIMA AND RIGHT GREATER SAPHENOUS VEIN (N/A) TRANSESOPHAGEAL ECHOCARDIOGRAM (TEE) (N/A) APPLICATION OF CELL SAVER ENDOVEIN HARVEST OF GREATER SAPHENOUS VEIN (Right)  POD 2 Hemodynamically stable in sinus rhythm with good Co-ox. Will DC milrinone. Resume Coreg and Entresto as BP allows.  DC foley  CVP 2 this am. Does not need more diuresis at this time.  Creat jumped up to 1.79 last pm but back to 1.58 this am. It was normal preop.   Acute postop blood loss anemia: Hgb 7.5. BP is good so will hold off on transfusion and continue iron.  DM: glucose under good control on levemir and SSI.  Should be able to transfer to 4E in am.   LOS: 2 days    Brett Walker 10/08/2021

## 2021-10-08 NOTE — Progress Notes (Signed)
Patient ID: Brett Walker, male   DOB: [birth year], 60 y.o.   MRN: [id number]     Advanced Heart Failure Rounding Note  PCP-Cardiologist: None   Subjective:    No complaints this morning, has been walking.   Milrinone 0.125, co-ox 72%.  CVP 4. Creatinine higher 1.58.   Objective:   Weight Range: 94.7 kg Body mass index is 30.83 kg/m.   Vital Signs:   Temp:  [98.1 F (36.7 C)-98.3 F (36.8 C)] 98.3 F (36.8 C) (12/18 0400) Pulse Rate:  [85-97] 85 (12/18 0900) Resp:  [14-24] 16 (12/18 0900) BP: (94-130)/(54-68) 118/65 (12/18 0800) SpO2:  [91 %-100 %] 95 % (12/18 0900) Arterial Line BP: (103-145)/(54-61) 145/61 (12/17 1100) Weight:  [94.7 kg] 94.7 kg (12/18 0545) Last BM Date: 10/06/21  Weight change: Filed Weights   10/06/21 0608 10/07/21 0500 10/08/21 0545  Weight: 90.7 kg 94.3 kg 94.7 kg    Intake/Output:   Intake/Output Summary (Last 24 hours) at 10/08/2021 1003 Last data filed at 10/08/2021 1000 Gross per 24 hour  Intake 1511.92 ml  Output 1155 ml  Net 356.92 ml      Physical Exam    General: NAD Neck: No JVD, no thyromegaly or thyroid nodule.  Lungs: Clear to auscultation bilaterally with normal respiratory effort. CV: Nondisplaced PMI.  Heart regular S1/S2, no S3/S4, no murmur.  No peripheral edema.   Abdomen: Soft, nontender, no hepatosplenomegaly, no distention.  Skin: Intact without lesions or rashes.  Neurologic: Alert and oriented x 3.  Psych: Normal affect. Extremities: No clubbing or cyanosis.  HEENT: Normal.    Telemetry   NSR 80s (personally reviewed)  Labs    CBC Recent Labs    10/07/21 1642 10/08/21 0401  WBC 8.9 7.5  HGB 8.3* 7.5*  HCT 26.0* 23.0*  MCV 93.5 93.1  PLT 104* 91*   Basic Metabolic Panel Recent Labs    21/62/44 0155 10/07/21 1642 10/08/21 0401  NA 137 136 138  K 4.5 4.6 4.3  CL 110 107 108  CO2 21* 23 25  GLUCOSE 114* 171* 97  BUN 26* 30* 32*  CREATININE 1.33* 1.79* 1.58*  CALCIUM 7.6* 7.9*  8.0*  MG 2.7* 2.7*  --    Liver Function Tests No results for input(s): AST, ALT, ALKPHOS, BILITOT, PROT, ALBUMIN in the last 72 hours. No results for input(s): LIPASE, AMYLASE in the last 72 hours. Cardiac Enzymes No results for input(s): CKTOTAL, CKMB, CKMBINDEX, TROPONINI in the last 72 hours.  BNP: BNP (last 3 results) Recent Labs    09/26/21 2216  BNP 1,194.7*    ProBNP (last 3 results) No results for input(s): PROBNP in the last 8760 hours.   D-Dimer No results for input(s): DDIMER in the last 72 hours. Hemoglobin A1C No results for input(s): HGBA1C in the last 72 hours. Fasting Lipid Panel No results for input(s): CHOL, HDL, LDLCALC, TRIG, CHOLHDL, LDLDIRECT in the last 72 hours. Thyroid Function Tests No results for input(s): TSH, T4TOTAL, T3FREE, THYROIDAB in the last 72 hours.  Invalid input(s): FREET3  Other results:   Imaging    DG Chest Port 1 View  Result Date: 10/08/2021 CLINICAL DATA:  Postop from CABG surgery.  Follow-up exam. EXAM: PORTABLE CHEST 1 VIEW COMPARISON:  10/07/2021 and older exams. FINDINGS: Since the prior exam, all lines and tubes have been removed. A right PICC has been inserted. PICC tip projects in the lower superior vena cava. There is mild persistent opacity at the lung bases, left  greater than right, consistent with atelectasis. Remainder of the lungs is clear. No pneumothorax.  No mediastinal widening. IMPRESSION: 1. No acute findings or evidence of an operative complication. 2. Mild residual, left greater than right, lung base atelectasis. Electronically Signed   By: Amie Portland M.D.   On: 10/08/2021 08:04   [place] EKG SITE RITE  Result Date: 10/07/2021 If Site Rite image not attached, placement could not be confirmed due to current cardiac rhythm.    Medications:     Scheduled Medications:  acetaminophen  1,000 mg Oral Q6H   Or   acetaminophen (TYLENOL) oral liquid 160 mg/5 mL  1,000 mg Per Tube Q6H   aspirin EC  325 mg  Oral Daily   Or   aspirin  324 mg Per Tube Daily   bisacodyl  10 mg Oral Daily   Or   bisacodyl  10 mg Rectal Daily   Chlorhexidine Gluconate Cloth  6 each Topical Daily   docusate sodium  200 mg Oral Daily   ferrous fumarate-b12-vitamic C-folic acid  1 capsule Oral BID PC   insulin aspart  0-24 Units Subcutaneous TID WC   insulin detemir  20 Units Subcutaneous Daily   mouth rinse  15 mL Mouth Rinse BID   pantoprazole  40 mg Oral Daily   pravastatin  40 mg Oral q1800   sodium chloride flush  10-40 mL Intracatheter Q12H   sodium chloride flush  3 mL Intravenous Q12H    Infusions:    PRN Medications: morphine injection, ondansetron (ZOFRAN) IV, oxyCODONE, sodium chloride flush, sodium chloride flush, traMADol    Assessment/Plan   Multivessel CAD - s/p CABG x 5 (LIMA-LAD, SVG-Diag, sequential SVG-OM/OM2 and SVG-PDA) on 12/16.  - ASA - Continue pravastatin 40 mg daily. Need to follow lipids as outpatient with low threshold for Repatha addition.  Does not tolerate other statins.    2. Chronic Systolic Heart Failure - Ischemic CM 2/2 3VCAD - Echo EF 30-35%, RV normal  - RHC 12/9  w/ normal CO, Fick cardiac output/index = 7.3/3.5 - now s/p CABG - on Milrinone 0.125 with co-ox 72%.  Can stop milrinone today.    - CVP 4 with creatinine to 1.58, no Lasix today.     3. ABLA   - expected surgical blood loss - Hgb 7.5.  - Hold off transfusion for now but would transfuse 1 unit if he drops any lower.     4. Type 2DM  - SSI  5. AKI - Mild rise to 1.58.  No diuresis today.   Mobilize.   Length of Stay: 2  Marca Ancona, MD  10/08/2021, 10:03 AM  Advanced Heart Failure Team Pager [phone] (M-F; 7a - 5p)  Please contact [facility] for night-coverage after hours (5p -7a ) and weekends on [email or web address]

## 2021-10-08 NOTE — Discharge Summary (Signed)
[address]       [place] [address]             [phone]    Physician Discharge Summary  Patient ID: Brett Walker MRN: [id number] DOB/AGE: 06/09/1961 60 y.o.  Admit date: 10/06/2021 Discharge date: 10/10/2021  Admission Diagnoses:  Patient Active Problem List   Diagnosis Date Noted   Ischemic cardiomyopathy 09/30/2021   3-vessel coronary artery disease 09/30/2021   Essential hypertension 09/27/2021   Diabetes mellitus type 2 in obese (HCC) 09/27/2021   Class 1 obesity due to excess calories with body mass index (BMI) of 32.0 to 32.9 in adult 09/27/2021   Acute CHF (congestive heart failure) (HCC) 09/26/2021   Alcoholic peripheral neuropathy (HCC) 09/28/2019   Diabetic polyneuropathy associated with type 2 diabetes mellitus (HCC) 09/28/2019   Discharge Diagnoses:  Patient Active Problem List   Diagnosis Date Noted   S/P CABG x 5 10/06/2021   Ischemic cardiomyopathy 09/30/2021   3-vessel coronary artery disease 09/30/2021   Essential hypertension 09/27/2021   Diabetes mellitus type 2 in obese (HCC) 09/27/2021   Class 1 obesity due to excess calories with body mass index (BMI) of 32.0 to 32.9 in adult 09/27/2021   Acute CHF (congestive heart failure) (HCC) 09/26/2021   Alcoholic peripheral neuropathy (HCC) 09/28/2019   Diabetic polyneuropathy associated with type 2 diabetes mellitus (HCC) 09/28/2019   Discharged Condition: stable  HPI:    The patient is a 60 year old gentleman with a history of diabetes with peripheral neuropathy and retinopathy, hypertension, hyperlipidemia, and history of alcohol abuse who reports a 2-1/2-week history of substernal chest pain and shortness of breath that waxed and waned.  He said that it was fairly bad but he thought it would probably pass and may be related to the flu.  He then developed progressive lower extremity edema, orthopnea, and PND.  He called his PCP and was told to go to the emergency  department.  D-dimer was 0.67.  He had a CTA of the chest which ruled out pulmonary embolism but did show moderate bilateral pleural effusions and mild vascular congestion.  High-sensitivity troponin was 55 and 56.  BNP was 1195.  He was negative for flu and COVID-19.  He was admitted for management of his acute heart failure.  2D echocardiogram on 09/27/2021 showed an ejection fraction of 30 to 35% with global hypokinesis and mild LVH.  There is grade 2 diastolic dysfunction.  Right ventricular systolic function was normal.  There was mild mitral regurgitation there is no aortic stenosis or insufficiency.  He underwent cardiac catheterization today showing severe multivessel coronary disease with ejection fraction of 25 to 35% by visual estimate.  Filling pressures were low and cardiac output was normal.   His family history is significant for his father having CABG at age 84 in the past 2 years.  The patient still works Electrical engineer.  He was a previous heavy alcohol user but reports minimal alcohol over the past few years.  He has never smoked.  Dr. Laneta Simmers evaluated the patient and all relevant studies and recommended proceeding with coronary artery surgical revascularization.  He was admitted this hospitalization for the procedure.  Hospital course:  The patient was admitted electively and taken the operating room on 10/06/2021 at which time he underwent CABG x5.  He tolerated the procedure well was taken to the surgical intensive care unit in stable condition.  The patient was extubated the  evening of surgery.  The patient was weaned off Milrinone as hemodynamics allowed.  His chest tubes and arterial lines were removed without difficulty. He was volume overloaded and will be initiated once stable off inotropic support.  His Milrinone was discontinued on 12/18 he was restarted on Coreg and Entresto as blood pressure allowed.  He developed a mild elevation in his creatinine level  peaking at 1.79.  He was started on iron for expected post operative blood loss anemia. Advanced heart failure followed post op and medications were adjusted accordingly. Patient was felt surgically stable for transfer from the ICU to 4E for further convalescence on 12/19. Patient has been ambulating on room air with good oxygenation. He has been tolerating a diet and has had a bowel movement. Wounds are clean, dry, healing without signs of infection. Epicardial pacing wires were removed on 12/20.  The patient remained clinically stable. His medications have been adjusted by Advanced Heart failure.  He is medically stable for discharge home today.  Consults:  AHF  Significant Diagnostic Studies:  Narrative & Impression  CLINICAL DATA:  Postop from CABG surgery.  Follow-up exam.   EXAM: PORTABLE CHEST 1 VIEW   COMPARISON:  10/07/2021 and older exams.   FINDINGS: Since the prior exam, all lines and tubes have been removed. A right PICC has been inserted. PICC tip projects in the lower superior vena cava.   There is mild persistent opacity at the lung bases, left greater than right, consistent with atelectasis. Remainder of the lungs is clear.   No pneumothorax.  No mediastinal widening.   IMPRESSION: 1. No acute findings or evidence of an operative complication. 2. Mild residual, left greater than right, lung base atelectasis.     Electronically Signed   By: Amie Portland M.D.   On: 10/08/2021 08:04         Ost RCA to Prox RCA lesion is 90% stenosed.   Dist RCA lesion is 99% stenosed.   RPDA lesion is 100% stenosed.   RPAV lesion is 100% stenosed.   Ost Cx to Prox Cx lesion is 80% stenosed.   1st Mrg lesion is 95% stenosed.   2nd Mrg lesion is 95% stenosed.   Prox LAD to Mid LAD lesion is 80% stenosed.   1st Diag lesion is 100% stenosed.   2nd Diag lesion is 99% stenosed.   Mid LAD to Dist LAD lesion is 75% stenosed.   The left ventricular ejection fraction is 25-35% by  visual estimate.   Findings:   Ao = 101/71 (88) LV = 102/5 RA =  2 RV = 27/5 PA = 30/12 (19) PCW = 2 Fick cardiac output/index = 7.3/3.5 PVR = 2.2 WU SVR = 941 Ao sat = 95% PA sat = 72%, 73% SVC sat = 75%   Assessment: 1. Severe 3v CAD 2. Low filling pressures with normal cardiac output 3. iCM EF ~25% 4. There was difficulty passing swan across pulmonary valve but no significant pulmonic stenosis (may be just angle of PA outflow tract)    Treatments: surgery:   Preoperative Diagnosis:  Severe multi-vessel coronary artery disease and severe LV systolic dysfunction.      Postoperative Diagnosis:  Same     Procedure:   Median Sternotomy Extracorporeal circulation 3.   Coronary artery bypass grafting x 5   Left internal mammary artery graft to the LAD SVG to diagonal Sequential SVG to OM1 and OM2 SVG to PDA 4.   Endoscopic vein harvest from the  right leg   Discharge Exam: Doree Fudge PA-C Blood pressure 134/72, pulse 80, temperature 98.4 F (36.9 C), temperature source Oral, resp. rate 15, height  (1.753 m), weight 92.5 kg, SpO2 100 %.   Cardiovascular: RRR Pulmonary: Clear to auscultation bilaterally Abdomen: Soft, non tender, bowel sounds present. Extremities: Mild bilateral lower extremity edema. Wounds: Clean and dry.  No erythema or signs of infection  Discharge Medications:  The patient has been discharged on:   1.Beta Blocker:  Yes [ X  ]                              No   [   ]                              If No, reason:  2.Ace Inhibitor/ARB: Yes [  X ]                                     No  [    ]                                     If No, reason:  3.Statin:   Yes [ X  ]                  No  [   ]                  If No, reason:  4.Ecasa:  Yes  [ x  ]                  No   [   ]                  If No, reason:  Patient had ACS upon admission:No  Plavix/P2Y12 inhibitor: Yes [   ]                                      No  [  x  ]      Allergies as of 10/10/2021       Reactions   Penicillin G Hives   Atorvastatin    fecal urgency   Crestor [rosuvastatin]    GI cramping   Lipitor [atorvastatin Calcium] Diarrhea   Ozempic (0.25 Or 0.5 Mg-dose) [semaglutide(0.25 Or 0.5mg -dos)] Diarrhea        Medication List     TAKE these medications    aspirin 325 MG EC tablet Take 1 tablet (325 mg total) by mouth daily. What changed:  medication strength how much to take additional instructions Another medication with the same name was removed. Continue taking this medication, and follow the directions you see here.   carvedilol 3.125 MG tablet Commonly known as: COREG Take 1 tablet (3.125 mg total) by mouth 2 (two) times daily with a meal.   furosemide 20 MG tablet Commonly known as: LASIX Take 1 tablet (20 mg total) by mouth daily as needed. For weight gain 3 lbs What changed:  when to take this reasons to take this additional instructions   gabapentin 300 MG capsule Commonly known  as: NEURONTIN Take 900 mg by mouth at bedtime.   metFORMIN 750 MG 24 hr tablet Commonly known as: GLUCOPHAGE-XR Take 750 mg by mouth 2 (two) times daily.   OVER THE COUNTER MEDICATION Take 1 capsule by mouth at bedtime as needed (sleep). CBD gummies   pravastatin 40 MG tablet Commonly known as: PRAVACHOL Take 1 tablet (40 mg total) by mouth daily at 6 PM.   sacubitril-valsartan 24-26 MG Commonly known as: ENTRESTO Take 1 tablet by mouth 2 (two) times daily.   spironolactone 25 MG tablet Commonly known as: ALDACTONE Take 0.5 tablets (12.5 mg total) by mouth daily.   traMADol 50 MG tablet Commonly known as: ULTRAM Take 1 tablet (50 mg total) by mouth every 4 (four) hours as needed for moderate pain.               Durable Medical Equipment  (From admission, onward)           Start     Ordered   10/09/21 1909  For home use only DME 3 n 1  Once        10/09/21 1908            Follow-up  Information     [facility] [place] Go on 11/08/2021.   Specialty: Cardiothoracic Surgery Why: PA/LAT CXR to be taken (at [facility] which is in the same building as Dr. Sharee Pimple office) on 01/18 at 2:00 pm;Appointment time is at 2:30 pm Contact information: [address] [place], [address] [place] [address] [phone]        [facility] [place]. Go on 10/20/2021.   Specialty: Cardiothoracic Surgery Why: Appointment time is at 10:00 am Contact information: [address] [place], [address] [place] [address] [phone]        [facility] Follow up on 10/13/2021.   Specialty: Cardiology Why: Advanced Heart Failure Clinic at [facility] 11 am Entrance C, Garage Code 5555 Contact information: [address] [id number] mc [place] [address] [phone]                Signed: Doree Fudge PA-C 10/10/2021, 1:23 PM

## 2021-10-08 NOTE — Care Management (Signed)
°  Transition of Care [facility]) Screening Note   Patient Details  Name: ZAIRE LEVESQUE III Date of Birth: December 05, 1960   Transition of Care [facility]) CM/SW Contact:    Lawerance Sabal, RN Phone Number: 10/08/2021, 7:30 AM    Transition of Care Department [facility]) has reviewed patient and no TOC needs have been identified at this time. We will continue to monitor patient advancement through interdisciplinary progression rounds. If new patient transition needs arise, please place a TOC consult.

## 2021-10-09 ENCOUNTER — Other Ambulatory Visit (HOSPITAL_COMMUNITY): Payer: Self-pay

## 2021-10-09 ENCOUNTER — Encounter (HOSPITAL_COMMUNITY): Payer: Self-pay | Admitting: Surgery

## 2021-10-09 LAB — BASIC METABOLIC PANEL
Anion gap: 4 — ABNORMAL LOW (ref 5–15)
BUN: 33 mg/dL — ABNORMAL HIGH (ref 6–20)
CO2: 26 mmol/L (ref 22–32)
Calcium: 8 mg/dL — ABNORMAL LOW (ref 8.9–10.3)
Chloride: 108 mmol/L (ref 98–111)
Creatinine, Ser: 1.36 mg/dL — ABNORMAL HIGH (ref 0.61–1.24)
GFR, Estimated: >60 mL/min (ref >60)
Glucose, Bld: 73 mg/dL (ref 70–99)
Potassium: 3.9 mmol/L (ref 3.5–5.1)
Sodium: 138 mmol/L (ref 135–145)

## 2021-10-09 LAB — CBC
HCT: 23.1 % — ABNORMAL LOW (ref 39.0–52.0)
Hemoglobin: 7.4 g/dL — ABNORMAL LOW (ref 13.0–17.0)
MCH: 30.2 pg (ref 26.0–34.0)
MCHC: 32 g/dL (ref 30.0–36.0)
MCV: 94.3 fL (ref 80.0–100.0)
Platelets: 103 10*3/uL — ABNORMAL LOW (ref 150–400)
RBC: 2.45 MIL/uL — ABNORMAL LOW (ref 4.22–5.81)
RDW: 13.5 % (ref 11.5–15.5)
WBC: 6 10*3/uL (ref 4.0–10.5)
nRBC: 0 % (ref 0.0–0.2)

## 2021-10-09 LAB — GLUCOSE, CAPILLARY
Glucose-Capillary: 75 mg/dL (ref 70–99)
Glucose-Capillary: 138 mg/dL — ABNORMAL HIGH (ref 70–99)
Glucose-Capillary: 118 mg/dL — ABNORMAL HIGH (ref 70–99)
Glucose-Capillary: 160 mg/dL — ABNORMAL HIGH (ref 70–99)

## 2021-10-09 LAB — COOXEMETRY PANEL
Carboxyhemoglobin: 1.7 % — ABNORMAL HIGH (ref 0.5–1.5)
Methemoglobin: 0.8 % (ref 0.0–1.5)
O2 Saturation: 66.4 %
Total hemoglobin: 8.3 g/dL — ABNORMAL LOW (ref 12.0–16.0)

## 2021-10-09 LAB — ECHO INTRAOPERATIVE TEE
Height: 69 in
Weight: 3200 oz

## 2021-10-09 MED ORDER — LOSARTAN POTASSIUM 25 MG PO TABS
25.0000 mg | ORAL_TABLET | Freq: Every day | ORAL | Status: DC
  Administered 2021-10-09 – 2021-10-10 (×2): 25 mg via ORAL
  Filled 2021-10-09 (×2): qty 1

## 2021-10-09 MED ORDER — SODIUM CHLORIDE 0.9% FLUSH: 3.0000 mL | INTRAVENOUS | Status: DC | PRN

## 2021-10-09 MED ORDER — ONDANSETRON HCL 4 MG/2ML IJ SOLN: 4.0000 mg | Freq: Four times a day (QID) | INTRAMUSCULAR | Status: DC | PRN

## 2021-10-09 MED ORDER — SPIRONOLACTONE 12.5 MG HALF TABLET
12.5000 mg | ORAL_TABLET | Freq: Every day | ORAL | Status: DC
  Administered 2021-10-09 – 2021-10-10 (×2): 12.5 mg via ORAL
  Filled 2021-10-09 (×2): qty 1

## 2021-10-09 MED ORDER — ~~LOC~~ CARDIAC SURGERY, PATIENT & FAMILY EDUCATION: Freq: Once | Status: AC

## 2021-10-09 MED ORDER — POTASSIUM CHLORIDE CRYS ER 20 MEQ PO TBCR
40.0000 meq | EXTENDED_RELEASE_TABLET | Freq: Once | ORAL | Status: AC
  Administered 2021-10-09: 12:00:00 40 meq via ORAL
  Filled 2021-10-09: qty 2

## 2021-10-09 MED ORDER — SENNOSIDES-DOCUSATE SODIUM 8.6-50 MG PO TABS: 1.0000 | ORAL_TABLET | Freq: Two times a day (BID) | ORAL | Status: DC | PRN

## 2021-10-09 MED ORDER — SODIUM CHLORIDE 0.9% FLUSH
3.0000 mL | Freq: Two times a day (BID) | INTRAVENOUS | Status: DC
  Administered 2021-10-09 – 2021-10-10 (×3): 3 mL via INTRAVENOUS

## 2021-10-09 MED ORDER — FUROSEMIDE 10 MG/ML IJ SOLN
40.0000 mg | Freq: Once | INTRAMUSCULAR | Status: AC
  Administered 2021-10-09: 12:00:00 40 mg via INTRAVENOUS
  Filled 2021-10-09: qty 4

## 2021-10-09 MED ORDER — ASPIRIN EC 325 MG PO TBEC
325.0000 mg | DELAYED_RELEASE_TABLET | Freq: Every day | ORAL | Status: DC
  Administered 2021-10-10: 08:00:00 325 mg via ORAL
  Filled 2021-10-09: qty 1

## 2021-10-09 MED ORDER — ONDANSETRON HCL 4 MG PO TABS: 4.0000 mg | ORAL_TABLET | Freq: Four times a day (QID) | ORAL | Status: DC | PRN

## 2021-10-09 MED ORDER — SODIUM CHLORIDE 0.9 % IV SOLN: 250.0000 mL | INTRAVENOUS | Status: DC | PRN

## 2021-10-09 MED FILL — Potassium Chloride Inj 2 mEq/ML: INTRAVENOUS | Qty: 40 | Status: AC

## 2021-10-09 MED FILL — Lidocaine HCl Local Preservative Free (PF) Inj 2%: INTRAMUSCULAR | Qty: 15 | Status: AC

## 2021-10-09 MED FILL — Heparin Sodium (Porcine) Inj 1000 Unit/ML: Qty: 1000 | Status: AC

## 2021-10-09 MED FILL — Heparin Sodium (Porcine) Inj 1000 Unit/ML: INTRAMUSCULAR | Qty: 5000 | Status: AC

## 2021-10-09 NOTE — Progress Notes (Signed)
3 Days Post-Op Procedure(s) (LRB): CORONARY ARTERY BYPASS GRAFTING (CABG) x 5 ON CARDIOPULMONARY BYPASS USING LIMA AND RIGHT GREATER SAPHENOUS VEIN (N/A) TRANSESOPHAGEAL ECHOCARDIOGRAM (TEE) (N/A) APPLICATION OF CELL SAVER ENDOVEIN HARVEST OF GREATER SAPHENOUS VEIN (Right) Subjective: No complaints  Objective: Vital signs in last 24 hours: Temp:  [98.7 F (37.1 C)-98.9 F (37.2 C)] 98.9 F (37.2 C) (12/19 0000) Pulse Rate:  [78-95] 81 (12/19 0600) Cardiac Rhythm: Normal sinus rhythm (12/18 1915) Resp:  [6-26] 26 (12/19 0600) BP: (112-139)/(59-78) 135/72 (12/19 0600) SpO2:  [91 %-98 %] 91 % (12/19 0600)  Hemodynamic parameters for last 24 hours: CVP:  [0 mmHg-10 mmHg] 3 mmHg  Intake/Output from previous day: 12/18 0701 - 12/19 0700 In: 153.8 [P.O.:150; I.V.:3.8] Out: 330 [Urine:330] Intake/Output this shift: Total I/O In: 150 [P.O.:150] Out: -   General appearance: alert and cooperative Neurologic: intact Heart: regular rate and rhythm Lungs: clear to auscultation bilaterally Extremities: edema minimal Wound: incision healing well.  Lab Results: Recent Labs    10/08/21 0401 10/09/21 0408  WBC 7.5 6.0  HGB 7.5* 7.4*  HCT 23.0* 23.1*  PLT 91* 103*   BMET:  Recent Labs    10/08/21 0401 10/09/21 0408  NA 138 138  K 4.3 3.9  CL 108 108  CO2 25 26  GLUCOSE 97 73  BUN 32* 33*  CREATININE 1.58* 1.36*  CALCIUM 8.0* 8.0*    PT/INR:  Recent Labs    10/06/21 1358  LABPROT 17.7*  INR 1.5*   ABG    Component Value Date/Time   PHART 7.364 10/06/2021 2328   HCO3 23.5 10/06/2021 2328   TCO2 25 10/06/2021 2328   ACIDBASEDEF 2.0 10/06/2021 2328   O2SAT 72.4 10/08/2021 0401   CBG (last 3)  Recent Labs    10/08/21 0646 10/08/21 1148 10/08/21 1638  GLUCAP 100* 145* 117*    Assessment/Plan: S/P Procedure(s) (LRB): CORONARY ARTERY BYPASS GRAFTING (CABG) x 5 ON CARDIOPULMONARY BYPASS USING LIMA AND RIGHT GREATER SAPHENOUS VEIN (N/A) TRANSESOPHAGEAL  ECHOCARDIOGRAM (TEE) (N/A) APPLICATION OF CELL SAVER ENDOVEIN HARVEST OF GREATER SAPHENOUS VEIN (Right)  POD 3  Hemodynamically stable off milrinone. Heart failure meds per Dr. Shirlee Latch. CVP has been 3.   Creat improving. HGB stable. DM: glucose under good control.  Transfer to 4E. Possibly home tomorrow.   LOS: 3 days    [name] 10/09/2021

## 2021-10-09 NOTE — TOC Benefit Eligibility Note (Signed)
Patient Product/process development scientist completed.    The patient is currently admitted and upon discharge could be taking Jardiance 10 mg.  The current 30 day co-pay is, $40.00.   The patient is currently admitted and upon discharge could be taking Farxiga 10 mg.  The current 30 day co-pay is, $40.00.   The patient is insured through [employer] [place] Medicare Part D     Roland Earl, CPhT Pharmacy Patient Advocate Specialist [facility] Patient Advocate Team Direct Number: [phone]  Fax: [phone]

## 2021-10-09 NOTE — Progress Notes (Signed)
Pt arrived to unit from 2 heart VSS, A/O x 4,  CCMD called ,CHG given, pt oriented to unit,Will continue to monitor.   Karna Christmas Gerado Nabers, RN    10/09/21 1435  Vitals  Temp 98.1 F (36.7 C)  Temp Source Oral  BP 131/77  MAP (mmHg) 92  BP Location Left Arm  BP Method Automatic  Patient Position (if appropriate) Sitting  Pulse Rate 89  Pulse Rate Source Monitor  ECG Heart Rate 89  Resp 17  Level of Consciousness  Level of Consciousness Alert  Oxygen Therapy  SpO2 99 %  O2 Device Room Air  O2 Flow Rate (L/min) 0 L/min  Pain Assessment  Pain Scale 0-10  Pain Score 0  MEWS Score  MEWS Temp 0  MEWS Systolic 0  MEWS Pulse 0  MEWS RR 0  MEWS LOC 0  MEWS Score 0  MEWS Score Color [place] Si

## 2021-10-09 NOTE — Discharge Instructions (Signed)

## 2021-10-09 NOTE — Progress Notes (Signed)
CARDIAC REHAB PHASE I   PRE:  Rate/Rhythm: 90 SR    BP: sitting 141/69    SaO2: 100 RA  MODE:  Ambulation: 560 ft   POST:  Rate/Rhythm: 106 ST    BP: sitting 157/74     SaO2: 99 RA  Pt moving independently. Stood and walked without any assist. Pt c/o some SOB but overall tolerated very well. Return to recliner. Pt sts his brother in law will be here shortly. I will return to do education with both of them and to help make a plan for d/c. Pt sts there are stairs at his sisters house, which will not be a problem for pt. [phone]   Brett Walker CES, ACSM 10/09/2021 11:13 AM

## 2021-10-09 NOTE — Progress Notes (Addendum)
Patient ID: Brett Walker, male   DOB: 1961/01/17, 60 y.o.   MRN: [id number]     Advanced Heart Failure Rounding Note  PCP-Cardiologist: None   Subjective:    Yesterday milrinone was stopped.   Creatinine 1.8> 1.6>1.4  Complaining of RLE pain. Able to walk around the unit.     Objective:   Weight Range: 93.7 kg Body mass index is 30.51 kg/m.   Vital Signs:   Temp:  [98.1 F (36.7 C)-98.9 F (37.2 C)] 98.1 F (36.7 C) (12/19 0700) Pulse Rate:  [78-91] 81 (12/19 0600) Resp:  [6-26] 26 (12/19 0600) BP: (112-139)/(59-78) 135/72 (12/19 0600) SpO2:  [91 %-98 %] 91 % (12/19 0600) Weight:  [93.7 kg] 93.7 kg (12/19 0650) Last BM Date: 10/08/21  Weight change: Filed Weights   10/07/21 0500 10/08/21 0545 10/09/21 0650  Weight: 94.3 kg 94.7 kg 93.7 kg    Intake/Output:   Intake/Output Summary (Last 24 hours) at 10/09/2021 0854 Last data filed at 10/08/2021 2300 Gross per 24 hour  Intake 150.4 ml  Output 290 ml  Net -139.6 ml      Physical Exam  CVP 3-4   General:  Sitting in the chair. No resp difficulty HEENT: normal Neck: supple. no JVD. Carotids 2+ bilat; no bruits. No lymphadenopathy or thryomegaly appreciated. Cor: PMI nondisplaced. Regular rate & rhythm. No rubs, gallops or murmurs.Sternal incision approximated.  Lungs: clear Abdomen: soft, nontender, nondistended. No hepatosplenomegaly. No bruits or masses. Good bowel sounds. Extremities: no cyanosis, clubbing, rash, edema. RLE 1+ edema.  RUE  PICC Neuro: alert & orientedx3, cranial nerves grossly intact. moves all 4 extremities w/o difficulty. Affect pleasant  Telemetry  SR 70-80s   Labs    CBC Recent Labs    10/08/21 0401 10/09/21 0408  WBC 7.5 6.0  HGB 7.5* 7.4*  HCT 23.0* 23.1*  MCV 93.1 94.3  PLT 91* 103*   Basic Metabolic Panel Recent Labs    37/62/83 0155 10/07/21 1642 10/08/21 0401 10/09/21 0408  NA 137 136 138 138  K 4.5 4.6 4.3 3.9  CL 110 107 108 108  CO2 21* 23 25 26    GLUCOSE 114* 171* 97 73  BUN 26* 30* 32* 33*  CREATININE 1.33* 1.79* 1.58* 1.36*  CALCIUM 7.6* 7.9* 8.0* 8.0*  MG 2.7* 2.7*  --   --    Liver Function Tests No results for input(s): AST, ALT, ALKPHOS, BILITOT, PROT, ALBUMIN in the last 72 hours. No results for input(s): LIPASE, AMYLASE in the last 72 hours. Cardiac Enzymes No results for input(s): CKTOTAL, CKMB, CKMBINDEX, TROPONINI in the last 72 hours.  BNP: BNP (last 3 results) Recent Labs    09/26/21 2216  BNP 1,194.7*    ProBNP (last 3 results) No results for input(s): PROBNP in the last 8760 hours.   D-Dimer No results for input(s): DDIMER in the last 72 hours. Hemoglobin A1C No results for input(s): HGBA1C in the last 72 hours. Fasting Lipid Panel No results for input(s): CHOL, HDL, LDLCALC, TRIG, CHOLHDL, LDLDIRECT in the last 72 hours. Thyroid Function Tests No results for input(s): TSH, T4TOTAL, T3FREE, THYROIDAB in the last 72 hours.  Invalid input(s): FREET3  Other results:   Imaging    No results found.   Medications:     Scheduled Medications:  acetaminophen  1,000 mg Oral Q6H   Or   acetaminophen (TYLENOL) oral liquid 160 mg/5 mL  1,000 mg Per Tube Q6H   aspirin EC  325 mg Oral Daily  Or   aspirin  324 mg Per Tube Daily   bisacodyl  10 mg Oral Daily   Or   bisacodyl  10 mg Rectal Daily   Chlorhexidine Gluconate Cloth  6 each Topical Daily   docusate sodium  200 mg Oral Daily   ferrous fumarate-b12-vitamic C-folic acid  1 capsule Oral BID PC   insulin aspart  0-24 Units Subcutaneous TID WC   insulin detemir  20 Units Subcutaneous Daily   mouth rinse  15 mL Mouth Rinse BID   pantoprazole  40 mg Oral Daily   pravastatin  40 mg Oral q1800   sodium chloride flush  10-40 mL Intracatheter Q12H   sodium chloride flush  3 mL Intravenous Q12H    Infusions:    PRN Medications: morphine injection, ondansetron (ZOFRAN) IV, oxyCODONE, sodium chloride flush, sodium chloride flush,  traMADol    Assessment/Plan   Multivessel CAD - s/p CABG x 5 (LIMA-LAD, SVG-Diag, sequential SVG-OM/OM2 and SVG-PDA) on 12/16.  - ASA - Continue pravastatin 40 mg daily. Need to follow lipids as outpatient with low threshold for Repatha addition.  Does not tolerate other statins.  - Add ted hose   2. 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   Check CO-OX -> 66%  - CVP 4 today.  - Add 12.5 mg spiro and losartan 25 mg daily  - Renal function stable.    3. ABLA   - expected surgical blood loss - Hgb 7.5.> 7.4   - on iron - Hold off transfusion for now.    4. Type 2DM  - SSI  5. AKI - Improving. 1.8>1.6>1.4.   Disposition: Live alone in single level apartment. His sister lives in [place]. Will need HH.   Length of Stay: 3  Amy Clegg, NP  10/09/2021, 8:54 AM  Advanced Heart Failure Team Pager [phone] (M-F; 7a - 5p)  Please contact [facility] for night-coverage after hours (5p -7a ) and weekends on [email or web address]  Patient seen and examined with the above-signed Advanced Practice Provider and/or Housestaff. I personally reviewed laboratory data, imaging studies and relevant notes. I independently examined the patient and formulated the important aspects of the plan. I have edited the note to reflect any of my changes or salient points. I have personally discussed the plan with the patient and/or family.  Stable post-op. Walking unit. Off inotropes. Co-ox ok. Weight still up 6 pounds  General: Sitting up in chiar. No resp difficulty HEENT: normal Neck: supple sternal wound ok . no JVD. Carotids 2+ bilat; no bruits. No lymphadenopathy or thryomegaly appreciated. Cor: PMI nondisplaced. Regular rate & rhythm. No rubs, gallops or murmurs. Lungs: clear Abdomen: soft, nontender, nondistended. No hepatosplenomegaly. No bruits or masses. Good  bowel sounds. Extremities: no cyanosis, clubbing, rash, 1+ RLEedema Neuro: alert & orientedx3, cranial nerves grossly intact. moves all 4 extremities w/o difficulty. Affect pleasant  Off pressors/intoropes. Co-ox ok.   Add spiro and losartan. Weight still up 6 pounds. Lasix 40IV x1.   Ok for 4E.   Arvilla Meres, MD  10:50 AM

## 2021-10-09 NOTE — Progress Notes (Signed)
Pt transferred to 4E room 13 via wheelchair and placed in chair.  Report given prior to transporting patient. Pacer wires remain taped, cables placed at bedside. All belongings transferred with patient. Placed on unit-appropriate tele by receiving RN. Pt to notify family of transfer. No other needs at this time.

## 2021-10-09 NOTE — Anesthesia Postprocedure Evaluation (Signed)
Anesthesia Post Note  Patient: Brett Walker  Procedure(s) Performed: CORONARY ARTERY BYPASS GRAFTING (CABG) x 5 ON CARDIOPULMONARY BYPASS USING LIMA AND RIGHT GREATER SAPHENOUS VEIN (Chest) TRANSESOPHAGEAL ECHOCARDIOGRAM (TEE) APPLICATION OF CELL SAVER ENDOVEIN HARVEST OF GREATER SAPHENOUS VEIN (Right: Leg Upper)     Patient location during evaluation: SICU Anesthesia Type: General Level of consciousness: sedated Pain management: pain level controlled Vital Signs Assessment: post-procedure vital signs reviewed and stable Respiratory status: patient remains intubated per anesthesia plan Cardiovascular status: stable Postop Assessment: no apparent nausea or vomiting Anesthetic complications: no   No notable events documented.  Last Vitals:  Vitals:   10/09/21 0600 10/09/21 0700  BP: 135/72   Pulse: 81   Resp: (!) 26   Temp:  36.7 C  SpO2: 91%     Last Pain:  Vitals:   10/09/21 0700  TempSrc: Oral  PainSc:                  Bassel Gaskill S

## 2021-10-09 NOTE — Evaluation (Signed)
Occupational Therapy Evaluation Patient Details Name: Brett Walker MRN: [id number] DOB: 04-Nov-1960 Today's Date: 10/09/2021   History of Present Illness The patient is a 60 year old with a history of diabetes with peripheral neuropathy and retinopathy, hypertension, hyperlipidemia, and history of alcohol abuse who reports a 2-1/2-week history of substernal chest pain and shortness of breath that waxed and waned. Pt is s/p CABG x5 on 12/16.   Clinical Impression   PTA, pt was living at home alone, pt report he was independent with ADL/IADL and functional mobility without AD. Pt was working and driving. Pt currently demonstrates ability to complete ADL at modified independent level and functional mobility completed independently. Pt with good adherence to sternal precautions. Educated pt on sternal precautions, activity progression/pacing, importance of mobility and compensatory/adaptive strategies to maintain adherence to precautions. Pt verbalized understanding. Pt reports he will be discharging to his sister's house in [place]. Anticipate pt will continue to progress. Patient evaluated by Occupational Therapy with no further acute OT needs identified. All education has been completed and the patient has no further questions. See below for any follow-up Occupational Therapy or equipment needs. OT to sign off. Thank you for referral.         Recommendations for follow up therapy are one component of a multi-disciplinary discharge planning process, led by the attending physician.  Recommendations may be updated based on patient status, additional functional criteria and insurance authorization.   Follow Up Recommendations  No OT follow up    Assistance Recommended at Discharge    Functional Status Assessment     Equipment Recommendations  BSC/3in1    Recommendations for Other Services       Precautions / Restrictions Precautions Precautions: Sternal Precaution Booklet Issued: Yes  (comment) Precaution Comments: verbally reviewed sternal precautions and provided handout Restrictions Weight Bearing Restrictions: Yes Other Position/Activity Restrictions: sternal precautions      Mobility Bed Mobility Overal bed mobility: Modified Independent Bed Mobility: Supine to Sit;Sit to Supine           General bed mobility comments: pt demonstrated good log roll with proper adherence to precautions    Transfers Overall transfer level: Independent                        Balance Overall balance assessment: Independent                                         ADL either performed or assessed with clinical judgement   ADL Overall ADL's : Modified independent                                       General ADL Comments: pt demonstrated ability to complete ADL with adherence to sternal precautions. Reviewed compensatory/adaptive strategies for safe engagement in ADL/IADL with adherence to precautions. pt verbalized understanding.     Vision         Perception     Praxis      Pertinent Vitals/Pain Pain Assessment: No/denies pain     Hand Dominance Right   Extremity/Trunk Assessment Upper Extremity Assessment Upper Extremity Assessment: Overall WFL for tasks assessed   Lower Extremity Assessment Lower Extremity Assessment: Overall WFL for tasks assessed   Cervical / Trunk Assessment Cervical / Trunk Assessment: Normal  Communication Communication Communication: No difficulties   Cognition Arousal/Alertness: Awake/alert Behavior During Therapy: WFL for tasks assessed/performed;Flat affect Overall Cognitive Status: Within Functional Limits for tasks assessed                                       General Comments  educated pt on activity progression/pacing, sternal precautions, monitoring status during ADL, energy conservation strategies    Exercises     Shoulder Instructions       Home Living Family/patient expects to be discharged to:: Private residence Living Arrangements: Alone Available Help at Discharge: Family;Available PRN/intermittently Type of Home: Apartment Home Access: Level entry     Home Layout: One level               Home Equipment: None   Additional Comments: Pt reports he plans to discharge to his sister's house in [place]. 3 steps to enter, no rails. He will be able to stay on the main level, has access to a tub/shower unit and a walk-in shower. Pt reports he will be home alone intermittently.      Prior Functioning/Environment Prior Level of Function : Independent/Modified Independent;Working/employed;Driving             Mobility Comments: Independent without DME, drives, 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 Reports job is on his feet a lot, but primarily sedentary at home          OT Problem List: Decreased activity tolerance;Decreased knowledge of precautions      OT Treatment/Interventions:      OT Goals(Current goals can be found in the care plan section) Acute Rehab OT Goals Patient Stated Goal: to go home OT Goal Formulation: With patient Time For Goal Achievement: 10/23/21 Potential to Achieve Goals: Good  OT Frequency:     Barriers to D/C:            Co-evaluation              AM-PAC OT "6 Clicks" Daily Activity     Outcome Measure Help from another person eating meals?: None Help from another person taking care of personal grooming?: None Help from another person toileting, which includes using toliet, bedpan, or urinal?: None Help from another person bathing (including washing, rinsing, drying)?: None Help from another person to put on and taking off regular upper body clothing?: None Help from another person to put on and taking off regular lower body clothing?: None 6 Click Score: 24   End of Session Nurse Communication: Mobility status  Activity Tolerance: Patient tolerated treatment  well Patient left: in chair;with call bell/phone within reach  OT Visit Diagnosis: Other abnormalities of gait and mobility (R26.89)                Time: 7510-2585 OT Time Calculation (min): 22 min Charges:  OT General Charges $OT Visit: 1 Visit OT Evaluation $OT Eval Moderate Complexity: 1 Mod  Brittney Mucha OTR/L Acute Rehabilitation Services Office: [phone]   Rebeca Alert 10/09/2021, 3:41 PM

## 2021-10-09 NOTE — Evaluation (Signed)
Physical Therapy Evaluation Patient Details Name: Brett Walker MRN: [id number] DOB: 07-19-1961 Today's Date: 10/09/2021  History of Present Illness  Patient is a 60 y/o male who presents on 10/06/21 with SOB and CP, now s/p CABG x5 10/06/21. PMH includes DM with peripheral neuropathy, HTN, alcohol abuse.  Clinical Impression  Patient presents with decreased mobility s/p above surgery. Pt is independent and works PTA. Plans to d/c to sister's house in [place]. Today, pt tolerated transfers, gait and stair training with Mod I. Education re: sternal precautions, "move in the tube," activity/mobility recommendations and pacing self etc. Will work on bed mobility and negotiating stairs without use of rail next session to prepare for d/c. VSS on RA. Will follow acutely to maximize independence and mobility prior to return home.     Recommendations for follow up therapy are one component of a multi-disciplinary discharge planning process, led by the attending physician.  Recommendations may be updated based on patient status, additional functional criteria and insurance authorization.  Follow Up Recommendations No PT follow up    Assistance Recommended at Discharge PRN  Functional Status Assessment Patient has had a recent decline in their functional status and demonstrates the ability to make significant improvements in function in a reasonable and predictable amount of time.  Equipment Recommendations  None recommended by PT    Recommendations for Other Services       Precautions / Restrictions Precautions Precautions: Sternal Precaution Booklet Issued: Yes (comment) Precaution Comments: verbally reviewed sternal precautions and provided handout Restrictions Weight Bearing Restrictions: Yes Other Position/Activity Restrictions: sternal precautions      Mobility  Bed Mobility Overal bed mobility: Modified Independent Bed Mobility: Supine to Sit;Sit to Supine            General bed mobility comments: Up in chair upon PT arrival.    Transfers Overall transfer level: Independent Equipment used: None               General transfer comment: Stood from chair wihtout difficulty and not using UEs to adhere to precautions    Ambulation/Gait Ambulation/Gait assistance: Independent Gait Distance (Feet): 350 Feet Assistive device: None Gait Pattern/deviations: WFL(Within Functional Limits)   Gait velocity interpretation: 1.31 - 2.62 ft/sec, indicative of limited community ambulator   General Gait Details: Steady gait, no LOB or dizziness. No SOB noted.  Stairs Stairs: Yes Stairs assistance: Modified independent (Device/Increase time) Stair Management: One rail Left;Step to pattern Number of Stairs: 3 General stair comments: Limited by lines but no assist needed.  Wheelchair Mobility    Modified Rankin (Stroke Patients Only)       Balance Overall balance assessment: No apparent balance deficits (not formally assessed)                                           Pertinent Vitals/Pain Pain Assessment: No/denies pain    Home Living Family/patient expects to be discharged to:: Private residence Living Arrangements: Other relatives (at sister's home) Available Help at Discharge: Family;Available PRN/intermittently Type of Home: House Home Access: Stairs to enter Entrance Stairs-Rails: None Entrance Stairs-Number of Steps: 2-3 Alternate Level Stairs-Number of Steps: 1 flight Home Layout: Two level Home Equipment: None Additional Comments: Pt reports he plans to discharge to his sister's house in [place]. 3 steps to enter, no rails. He will be able to stay on the main level, has access to  a tub/shower unit and a walk-in shower. Pt reports he will be home alone intermittently.    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 a lot, but primarily sedentary at home       Hand Dominance   Dominant Hand: Right    Extremity/Trunk Assessment   Upper Extremity Assessment Upper Extremity Assessment: Defer to OT evaluation    Lower Extremity Assessment Lower Extremity Assessment: Overall WFL for tasks assessed    Cervical / Trunk Assessment Cervical / Trunk Assessment: Normal  Communication   Communication: No difficulties  Cognition Arousal/Alertness: Awake/alert Behavior During Therapy: WFL for tasks assessed/performed;Flat affect Overall Cognitive Status: Within Functional Limits for tasks assessed                                          General Comments General comments (skin integrity, edema, etc.): educated pt on activity progression/pacing, sternal precautions, monitoring status during ADL, energy conservation strategies    Exercises     Assessment/Plan    PT Assessment Patient needs continued PT services  PT Problem List Decreased skin integrity;Impaired sensation;Decreased knowledge of precautions;Decreased mobility;Decreased activity tolerance       PT Treatment Interventions Therapeutic activities;Functional mobility training;Stair training;Therapeutic exercise;Patient/family education    PT Goals (Current goals can be found in the Care Plan section)  Acute Rehab PT Goals Patient Stated Goal: to go home PT Goal Formulation: With patient Time For Goal Achievement: 10/23/21 Potential to Achieve Goals: Good    Frequency Min 3X/week   Barriers to discharge Decreased caregiver support      Co-evaluation               AM-PAC PT "6 Clicks" Mobility  Outcome Measure Help needed turning from your back to your side while in a flat bed without using bedrails?: None Help needed moving from lying on your back to sitting on the side of a flat bed without using bedrails?:  None Help needed moving to and from a bed to a chair (including a wheelchair)?: None Help needed standing up from a chair using your arms (e.g., wheelchair or bedside chair)?: None Help needed to walk in hospital room?: None Help needed climbing 3-5 steps with a railing? : None 6 Click Score: 24    End of Session   Activity Tolerance: Patient tolerated treatment well Patient left: in chair;with call bell/phone within reach;Other (comment) (with OT present) Nurse Communication: Mobility status PT Visit Diagnosis: Other abnormalities of gait and mobility (R26.89)    Time: 2505-3976 PT Time Calculation (min) (ACUTE ONLY): 16 min   Charges:   PT Evaluation $PT Eval Moderate Complexity: 1 Mod          Vale Haven, PT, DPT Acute Rehabilitation Services Pager [phone] Office [phone]     Blake Divine A Lanier Ensign 10/09/2021, 3:54 PM

## 2021-10-10 ENCOUNTER — Inpatient Hospital Stay (HOSPITAL_COMMUNITY): Payer: BC Managed Care – PPO

## 2021-10-10 LAB — CBC
HCT: 23.2 % — ABNORMAL LOW (ref 39.0–52.0)
Hemoglobin: 7.5 g/dL — ABNORMAL LOW (ref 13.0–17.0)
MCH: 29.5 pg (ref 26.0–34.0)
MCHC: 32.3 g/dL (ref 30.0–36.0)
MCV: 91.3 fL (ref 80.0–100.0)
Platelets: 139 10*3/uL — ABNORMAL LOW (ref 150–400)
RBC: 2.54 MIL/uL — ABNORMAL LOW (ref 4.22–5.81)
RDW: 13.3 % (ref 11.5–15.5)
WBC: 4.8 10*3/uL (ref 4.0–10.5)
nRBC: 0 % (ref 0.0–0.2)

## 2021-10-10 LAB — BASIC METABOLIC PANEL
Anion gap: 5 (ref 5–15)
BUN: 32 mg/dL — ABNORMAL HIGH (ref 6–20)
CO2: 25 mmol/L (ref 22–32)
Calcium: 8.1 mg/dL — ABNORMAL LOW (ref 8.9–10.3)
Chloride: 108 mmol/L (ref 98–111)
Creatinine, Ser: 1.23 mg/dL (ref 0.61–1.24)
GFR, Estimated: >60 mL/min (ref >60)
Glucose, Bld: 93 mg/dL (ref 70–99)
Potassium: 4 mmol/L (ref 3.5–5.1)
Sodium: 138 mmol/L (ref 135–145)

## 2021-10-10 LAB — COOXEMETRY PANEL
Carboxyhemoglobin: 1.8 % — ABNORMAL HIGH (ref 0.5–1.5)
Methemoglobin: 0.8 % (ref 0.0–1.5)
O2 Saturation: 66.1 %
Total hemoglobin: 7.8 g/dL — ABNORMAL LOW (ref 12.0–16.0)

## 2021-10-10 LAB — GLUCOSE, CAPILLARY
Glucose-Capillary: 78 mg/dL (ref 70–99)
Glucose-Capillary: 193 mg/dL — ABNORMAL HIGH (ref 70–99)

## 2021-10-10 MED ORDER — FUROSEMIDE 20 MG PO TABS: 20.0000 mg | ORAL_TABLET | Freq: Every day | ORAL | Status: DC | PRN

## 2021-10-10 MED ORDER — ASPIRIN 325 MG PO TBEC: 325.0000 mg | DELAYED_RELEASE_TABLET | Freq: Every day | ORAL | 0 refills | Status: DC

## 2021-10-10 MED ORDER — SACUBITRIL-VALSARTAN 24-26 MG PO TABS: 1.0000 | ORAL_TABLET | Freq: Two times a day (BID) | ORAL | Status: DC

## 2021-10-10 MED ORDER — CARVEDILOL 3.125 MG PO TABS
3.1250 mg | ORAL_TABLET | Freq: Two times a day (BID) | ORAL | Status: DC
  Administered 2021-10-10: 08:00:00 3.125 mg via ORAL
  Filled 2021-10-10: qty 1

## 2021-10-10 MED ORDER — TRAMADOL HCL 50 MG PO TABS: 50.0000 mg | ORAL_TABLET | ORAL | 0 refills | Status: DC | PRN

## 2021-10-10 MED ORDER — GABAPENTIN 300 MG PO CAPS: 900.0000 mg | ORAL_CAPSULE | Freq: Every day | ORAL | Status: DC

## 2021-10-10 NOTE — Progress Notes (Addendum)
Patient ID: Brett Walker, male   DOB: [birth year], 60 y.o.   MRN: [id number]     Advanced Heart Failure Rounding Note  PCP-Cardiologist: None   Subjective:    CO-OX stable off milrinone.  Scr improving, 1.23 today  Hgb 7.5  Weight down 6 lb after IV lasix yesterday. No Is/Os.  BP stable  Feeling good. Sternum tender with palpation but otherwise no significant pain.   Objective:   Weight Range: 92.5 kg Body mass index is 30.13 kg/m.   Vital Signs:   Temp:  [98 F (36.7 C)-99.2 F (37.3 C)] 98 F (36.7 C) (12/20 0800) Pulse Rate:  [82-94] 94 (12/20 0800) Resp:  [12-33] 17 (12/20 0800) BP: (107-153)/(69-92) 134/69 (12/20 0800) SpO2:  [95 %-100 %] 98 % (12/20 0800) Weight:  [92.5 kg] 92.5 kg (12/20 0440) Last BM Date: 10/09/21  Weight change: Filed Weights   10/08/21 0545 10/09/21 0650 10/10/21 0440  Weight: 94.7 kg 93.7 kg 92.5 kg    Intake/Output:   Intake/Output Summary (Last 24 hours) at 10/10/2021 1045 Last data filed at 10/10/2021 0000 Gross per 24 hour  Intake 0 ml  Output 0 ml  Net 0 ml      Physical Exam  CVP 3-4 General:  Lying comfortably in bed HEENT: normal Neck: supple. no JVD. Carotids 2+ bilat; no bruits. No lymphadenopathy or thryomegaly appreciated. Cor: PMI nondisplaced. Regular rate & rhythm. No rubs, gallops or murmurs. Sternum stable.  Lungs: clear Abdomen: soft, nontender, nondistended. No hepatosplenomegaly. No bruits or masses. Good bowel sounds. Extremities: no cyanosis, clubbing, rash, 1+ edema, + RUE PICC  Neuro: alert & orientedx3, cranial nerves grossly intact. moves all 4 extremities w/o difficulty. Affect pleasant   Telemetry  SR 80s-90s  Labs    CBC Recent Labs    10/09/21 0408 10/10/21 0432  WBC 6.0 4.8  HGB 7.4* 7.5*  HCT 23.1* 23.2*  MCV 94.3 91.3  PLT 103* 139*   Basic Metabolic Panel Recent Labs    44/81/85 1642 10/08/21 0401 10/09/21 0408 10/10/21 0432  NA 136   < > 138 138  K 4.6   <  > 3.9 4.0  CL 107   < > 108 108  CO2 23   < > 26 25  GLUCOSE 171*   < > 73 93  BUN 30*   < > 33* 32*  CREATININE 1.79*   < > 1.36* 1.23  CALCIUM 7.9*   < > 8.0* 8.1*  MG 2.7*  --   --   --    < > = values in this interval not displayed.   Liver Function Tests No results for input(s): AST, ALT, ALKPHOS, BILITOT, PROT, ALBUMIN in the last 72 hours. No results for input(s): LIPASE, AMYLASE in the last 72 hours. Cardiac Enzymes No results for input(s): CKTOTAL, CKMB, CKMBINDEX, TROPONINI in the last 72 hours.  BNP: BNP (last 3 results) Recent Labs    09/26/21 2216  BNP 1,194.7*    ProBNP (last 3 results) No results for input(s): PROBNP in the last 8760 hours.   D-Dimer No results for input(s): DDIMER in the last 72 hours. Hemoglobin A1C No results for input(s): HGBA1C in the last 72 hours. Fasting Lipid Panel No results for input(s): CHOL, HDL, LDLCALC, TRIG, CHOLHDL, LDLDIRECT in the last 72 hours. Thyroid Function Tests No results for input(s): TSH, T4TOTAL, T3FREE, THYROIDAB in the last 72 hours.  Invalid input(s): FREET3  Other results:   Imaging  DG Chest 2 View  Result Date: 10/10/2021 CLINICAL DATA:  Weakness and sore chest today, post open heart surgery, pleural effusion EXAM: CHEST - 2 VIEW COMPARISON:  10/08/2021 FINDINGS: RIGHT arm PICC line tip projects over SVC near cavoatrial junction. Normal heart size, mediastinal contours, and pulmonary vascularity. Subsegmental atelectasis at LEFT base with minimal residual LEFT pleural effusion. Remaining lungs clear. No acute infiltrate or pneumothorax. Tiny RIGHT pleural effusion blunts the posterior costophrenic angle. IMPRESSION: Residual minimal bibasilar effusions and LEFT basilar atelectasis post CABG. Electronically Signed   By: Ulyses Southward M.D.   On: 10/10/2021 09:40     Medications:     Scheduled Medications:  acetaminophen  1,000 mg Oral Q6H   Or   acetaminophen (TYLENOL) oral liquid 160 mg/5 mL   1,000 mg Per Tube Q6H   aspirin EC  325 mg Oral Daily   carvedilol  3.125 mg Oral BID WC   Chlorhexidine Gluconate Cloth  6 each Topical Daily   ferrous fumarate-b12-vitamic C-folic acid  1 capsule Oral BID PC   gabapentin  900 mg Oral QHS   insulin aspart  0-24 Units Subcutaneous TID WC   insulin detemir  20 Units Subcutaneous Daily   losartan  25 mg Oral Daily   mouth rinse  15 mL Mouth Rinse BID   pantoprazole  40 mg Oral Daily   pravastatin  40 mg Oral q1800   sodium chloride flush  10-40 mL Intracatheter Q12H   sodium chloride flush  3 mL Intravenous Q12H   spironolactone  12.5 mg Oral Daily    Infusions:  sodium chloride       PRN Medications: sodium chloride, ondansetron **OR** ondansetron (ZOFRAN) IV, oxyCODONE, senna-docusate, sodium chloride flush, sodium chloride flush, traMADol    Assessment/Plan   Multivessel CAD - s/p CABG x 5 (LIMA-LAD, SVG-Diag, sequential SVG-OM/OM2 and SVG-PDA) on 12/16.  - ASA - Continue pravastatin 40 mg daily. Need to follow lipids as outpatient with low threshold for adding Repatha.  Does not tolerate other statins.  - [name]   2. 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 CO-OX 66% - CVP 3-4 - Spiro 12.5 mg daily - BP stable on Losartan 25. Will switch back to Entresto 24/26 mg BID (on PTA) - Started coreg 3.125 mg BID this am - Renal function stable.    3. ABLA   - expected surgical blood loss - Hgb 7.5> 7.4 > 7.5 - on iron - Hold off transfusion for now.    4. Type 2DM  - SSI  5. AKI - Improving. 1.8>1.6>1.4.1.23  Disposition: Lives alone in an apartment. Going to stay with sister in [place] at discharge. Seen by PT/OT  Discussed with Dr. Gala Romney. Okay for discharge today from HF perspective.   HF Medications: ASA 325 mg per CTS Coreg 3.125 mg BID Entresto 24/26 mg BID Spiro 12.5 mg  daily Furosemide 20 mg PRN KCL 20 mEQ PRN with furosemide Pravastatin 40 mg daily  Has f/u scheduled in HF clinic later this week   Length of Stay: 4  FINCH, LINDSAY N, PA-C  10/10/2021, 10:45 AM  Advanced Heart Failure Team Pager [phone] (M-F; 7a - 5p)  Please contact [facility] for night-coverage after hours (5p -7a ) and weekends on [email or web address]  Agree with above.   Looks good. Volume status improved. Rhythm and  labs stable.   Meds reviewed personally with PA and PharmdD.   Ok for d/c today. Will arrange f/u in HF Clinic.   Arvilla Meres, MD  5:50 PM

## 2021-10-10 NOTE — Progress Notes (Signed)
Pacing wires removed. Site clean dry and intact , Pt tolerated well, educated on bedrest. VSS,will continue to monitor .   Karna Christmas Diangelo Radel, RN     10/10/21 1100  Vitals  Temp 98 F (36.7 C)  Temp Source Oral  BP 120/66  MAP (mmHg) 82  BP Location Left Arm  BP Method Automatic  Patient Position (if appropriate) Lying  Pulse Rate 80  Pulse Rate Source Monitor  ECG Heart Rate 82  Resp 12  Level of Consciousness  Level of Consciousness Alert  Oxygen Therapy  SpO2 100 %  O2 Device Room Air  O2 Flow Rate (L/min) 0 L/min  Pain Assessment  Pain Scale 0-10  Pain Score 1  MEWS Score  MEWS Temp 0  MEWS Systolic 0  MEWS Pulse 0  MEWS RR 1  MEWS LOC 0  MEWS Score 1  MEWS Score Color Green

## 2021-10-10 NOTE — Progress Notes (Signed)
CARDIAC REHAB PHASE I   PRE:  Rate/Rhythm: 84 SR    BP: sitting 129/71    SaO2: 100 RA  MODE:  Ambulation: 470 ft   POST:  Rate/Rhythm: 96 SR    BP: sitting 128/63     SaO2: 99 RA  Tolerated well, independent. C/o neuropathy pain in feet. Return to recliner. Await family for ed [phone]  Harriet Masson CES, ACSM 10/10/2021 10:19 AM

## 2021-10-10 NOTE — Plan of Care (Signed)
  Problem: Education: Goal: Knowledge of General Education information will improve Description: Including pain rating scale, medication(s)/side effects and non-pharmacologic comfort measures Outcome: Adequate for Discharge   

## 2021-10-10 NOTE — Progress Notes (Addendum)
° °   °  [address]       [place] [address]             [phone]        4 Days Post-Op Procedure(s) (LRB): CORONARY ARTERY BYPASS GRAFTING (CABG) x 5 ON CARDIOPULMONARY BYPASS USING [place] AND RIGHT GREATER SAPHENOUS VEIN (N/A) TRANSESOPHAGEAL ECHOCARDIOGRAM (TEE) (N/A) APPLICATION OF CELL SAVER ENDOVEIN HARVEST OF GREATER SAPHENOUS VEIN (Right)  Subjective: Patient's only complaint is RLE pain  Objective: Vital signs in last 24 hours: Temp:  [98 F (36.7 C)-99.2 F (37.3 C)] 98.4 F (36.9 C) (12/20 0440) Pulse Rate:  [82-92] 85 (12/20 0440) Cardiac Rhythm: Normal sinus rhythm (12/19 2000) Resp:  [6-33] 16 (12/20 0440) BP: (107-153)/(69-92) 135/77 (12/20 0440) SpO2:  [95 %-100 %] 98 % (12/20 0440) Weight:  [92.5 kg] 92.5 kg (12/20 0440)  Pre op weight 90.7 kg Current Weight  10/10/21 92.5 kg    Hemodynamic parameters for last 24 hours: CVP:  [0 mmHg-43 mmHg] 3 mmHg  Intake/Output from previous day: No intake/output data recorded.   Physical Exam:  Cardiovascular: RRR Pulmonary: Clear to auscultation bilaterally Abdomen: Soft, non tender, bowel sounds present. Extremities: Mild bilateral lower extremity edema. Wounds: Clean and dry.  No erythema or signs of infection.  Lab Results: CBC: Recent Labs    10/09/21 0408 10/10/21 0432  WBC 6.0 4.8  HGB 7.4* 7.5*  HCT 23.1* 23.2*  PLT 103* 139*   BMET:  Recent Labs    10/09/21 0408 10/10/21 0432  NA 138 138  K 3.9 4.0  CL 108 108  CO2 26 25  GLUCOSE 73 93  BUN 33* 32*  CREATININE 1.36* 1.23  CALCIUM 8.0* 8.1*    PT/INR:  Lab Results  Component Value Date   INR 1.5 (H) 10/06/2021   INR 1.0 10/04/2021   ABG:  INR: Will add last result for INR, ABG once components are confirmed Will add last 4 CBG results once components are confirmed  Assessment/Plan:  1. CV - SR with HR in the 80's. On Losartan 25 mg daily . Co ox this am stable at 66.1 (Milrinone stopped stopped  12/18). Restart Carvedilol 2.  Pulmonary - On room air. Check PA/LAT CXR. Encourage incentive spirometer 3. Chronic systolic heart failure- On Spironolactone 12.5 mg daily. Heart failure following. 4.  Expected post op acute blood loss anemia - H and H this am stable at 7.5 and 23.2. Continue Trinsicon. 5. DM-CBGs 118/160/78. Will restart Metformin at discharge. Pre op HGA1C 6.6 6. Mild thrombocytopenia-platelets this am up to 139.000 7. Creatinine decreased from 1.36 to 1.23. Creatinine on admission 1.45. 8. Will restart Neurontin at discharge 9. Possibly home later today vs am, if ok with Dr. Dorann Lodge M ZimmermanPA-C 10/10/2021,6:56 AM   Patient seen and examined, agree with findings, assessment and plan as outlined above. Will resume gabapentin  Viviann Spare C. Dorris Fetch, MD [facility] [phone]

## 2021-10-10 NOTE — Progress Notes (Signed)
Discharge instructions (including medications) discussed with and copy provided to patient/caregiver 

## 2021-10-10 NOTE — Progress Notes (Signed)
Discussed with pt and brother in law IS, sternal precautions, HF management, diet (low sodium, DM), exercise, and CRPII. Very receptive. Pt c/o chest tightness after EPW out, 4/10. Began on left side and moved to right side while I was with him. Took BPs with pt sitting and after going to BR. Maintaining 130s/70s and HR controlled in 80s. RN aware, to bring pain meds. [phone] Ethelda Chick CES, ACSM 1:48 PM 10/10/2021

## 2021-10-12 NOTE — Progress Notes (Signed)
ADVANCED HF CLINIC CONSULT NOTE   Primary Care: [name], MD CT Surgeon: Dr. Laneta Simmers HF Cardiologist: Dr. Gala Romney  HPI: Brett Walker is a 60 y.o.male w/ HTN, HLD, T2DM and newly diagnosed systolic heart failure/ ischemic CM and multivessel disease.   Admitted 12/22 for chest pain. Echo EF 30-35%, GIIDD, RV normal. R/LHC showed severe 3VCAD (see angiographic details below), low filling pressures and normal CO, Fick cardiac output/index = 7.3/3.5. Referred for CABG. Underwent CABG x 5 12/22(LIMA-LAD, SVG-Diag, sequential SVG-OM/OM2 and SVG-PDA). AHF consulted to assist with management. He was discharged home on spiro, Entresto, carvedilol. Discharged to stay with sister in [place], weight  204 lbs.  Today he presents for post hospital heart failure follow up. Overall feeling fine. Main complaint is leg pain from vein harvest. He also has neuropathy. No SOB with walking around the house, but did get fatigued after taking a shower. He snores. Denies CP, dizziness, edema, or PND/Orthopnea. Appetite ok. No fever or chills. Weight at home 208-209 pounds. Taking all medications. Lives alone in an apartment, but staying with sister in [place] for the time being. Plans to start CR once cleared by CVTS.  Cardiac Studies - Echo (12/22): EF 30-35%, global HK, RV normal, small pericardial effusion.  - R/LHC (12/22):   Ost RCA to Prox RCA lesion is 90% stenosed.   Dist RCA lesion is 99% stenosed.   RPDA lesion is 100% stenosed.   RPAV lesion is 100% stenosed.   Ost Cx to Prox Cx lesion is 80% stenosed.   1st Mrg lesion is 95% stenosed.   2nd Mrg lesion is 95% stenosed.   Prox LAD to Mid LAD lesion is 80% stenosed.   1st Diag lesion is 100% stenosed.   2nd Diag lesion is 99% stenosed.   Mid LAD to Dist LAD lesion is 75% stenosed.   The left ventricular ejection fraction is 25-35% by visual estimate. Findings:   Ao = 101/71 (88) LV = 102/5 RA =  2 RV = 27/5 PA = 30/12 (19) PCW = 2 Fick  cardiac output/index = 7.3/3.5 PVR = 2.2 WU SVR = 941 Ao sat = 95% PA sat = 72%, 73% SVC sat = 75% Assessment: 1. Severe 3v CAD 2. Low filling pressures with normal cardiac output 3. iCM EF ~25% 4. There was difficulty passing swan across pulmonary valve but no significant pulmonic stenosis (may be just angle of PA outflow tract)  Review of Systems: [y] = yes, [ ]  = no   General: Weight gain [ ] ; Weight loss [ ] ; Anorexia [ ] ; Fatigue ]; Fever [ ] ; Chills [ ] ; Weakness [ ]   Cardiac: Chest pain/pressure [ ] ; Resting SOB [ ] ; Exertional SOB [ ] ; Orthopnea [ ] ; Pedal Edema [ ] ; Palpitations [ ] ; Syncope [ ] ; Presyncope [ ] ; Paroxysmal nocturnal dyspnea[ ]   Pulmonary: Cough [ ] ; Wheezing[ ] ; Hemoptysis[ ] ; Sputum [ ] ; Snoring [ ]   GI: Vomiting[ ] ; Dysphagia[ ] ; Melena[ ] ; Hematochezia [ ] ; Heartburn[ ] ; Abdominal pain [ ] ; Constipation [ ] ; Diarrhea [ ] ; BRBPR [ ]   GU: Hematuria[ ] ; Dysuria [ ] ; Nocturia[ ]   Vascular: Pain in legs with walking ]; Pain in feet with lying flat [ ] ; Non-healing sores [ ] ; Stroke [ ] ; TIA [ ] ; Slurred speech [ ] ;  Neuro: Headaches[ ] ; Vertigo[ ] ; Seizures[ ] ; Paresthesias[ ] ;Blurred vision [ ] ; Diplopia [ ] ; Vision changes [ ]   Ortho/Skin: Arthritis [ ] ; Joint pain [ ] ; Muscle  pain [ ] ; Joint swelling [ ] ; Back Pain [ ] ; Rash [ ]   Psych: Depression[ ] ; Anxiety[ ]   Heme: Bleeding problems [ ] ; Clotting disorders [ ] ; Anemia [ ]   Endocrine: Diabetes Blue.Reese ]; Thyroid dysfunction[ ]    Past Medical History:  Diagnosis Date   Cardiomyopathy ([place])    CHF (congestive heart failure) (HCC)    Coronary artery disease    Diabetes mellitus without complication (HCC)    Diabetic peripheral neuropathy (HCC)    Diabetic retinopathy (HCC)    Diverticulitis    Dyspnea    Elevated LDL cholesterol level    Family history of adverse reaction to anesthesia    Patient states his dad "woke up crazy from anesthesia and had to be restrained"   Hypertension     Pharyngoesophageal dysphagia    Current Outpatient Medications  Medication Sig Dispense Refill   aspirin EC 325 MG EC tablet Take 1 tablet (325 mg total) by mouth daily. 30 tablet 0   carvedilol (COREG) 3.125 MG tablet Take 1 tablet (3.125 mg total) by mouth 2 (two) times daily with a meal. 60 tablet 0   furosemide (LASIX) 20 MG tablet Take 1 tablet (20 mg total) by mouth daily as needed. For weight gain 3 lbs 30 tablet    gabapentin (NEURONTIN) 300 MG capsule Take 900 mg by mouth at bedtime.     metFORMIN (GLUCOPHAGE-XR) 750 MG 24 hr tablet Take 750 mg by mouth 2 (two) times daily.     OVER THE COUNTER MEDICATION Take 1 capsule by mouth at bedtime as needed (sleep). CBD gummies     pravastatin (PRAVACHOL) 40 MG tablet Take 1 tablet (40 mg total) by mouth daily at 6 PM. 30 tablet 0   sacubitril-valsartan (ENTRESTO) 24-26 MG Take 1 tablet by mouth 2 (two) times daily. 60 tablet 0   spironolactone (ALDACTONE) 25 MG tablet Take 0.5 tablets (12.5 mg total) by mouth daily. 15 tablet 0   traMADol (ULTRAM) 50 MG tablet Take 1 tablet (50 mg total) by mouth every 4 (four) hours as needed for moderate pain. 30 tablet 0   No current facility-administered medications for this encounter.   Allergies  Allergen Reactions   Penicillin G Hives   Atorvastatin     fecal urgency   Lipitor [Atorvastatin Calcium] Diarrhea   Other Other (See Comments)   Ozempic (0.25 Or 0.5 Mg-Dose) [Semaglutide(0.25 Or 0.5mg -Dos)] Diarrhea   Rosuvastatin Other (See Comments)    GI cramping   Social History   Socioeconomic History   Marital status: Single    Spouse name: Not on file   Number of children: 0   Years of education: 14   Highest education level: Not on file  Occupational History   Occupation: [employer]  Tobacco Use   Smoking status: Never   Smokeless tobacco: Never  Vaping Use   Vaping Use: Never used  Substance and Sexual Activity   Alcohol use: Not Currently    Comment: h/o heavy use   Drug use:  Never   Sexual activity: Not on file  Other Topics Concern   Not on file  Social History Narrative   Works at [employer]   Right handed   First floor apartment   Social Determinants of Health   Financial Resource Strain: Not on file  Food Insecurity: Not on file  Transportation Needs: Not on file  Physical Activity: Not on file  Stress: Not on file  Social Connections: Not on  file  Intimate Partner Violence: Not on file   Family History  Problem Relation Age of Onset   Parkinson's disease Father    CAD Father    Hydrocephalus Brother    CAD Paternal Grandfather    BP (!) 106/56    Pulse 81    Ht 5\' 9"  (1.753 m)    Wt 94.7 kg (208 lb 12.8 oz)    SpO2 98%    BMI 30.83 kg/m   Wt Readings from Last 3 Encounters:  10/13/21 94.7 kg (208 lb 12.8 oz)  10/10/21 92.5 kg (204 lb)  10/04/21 92.3 kg (203 lb 6.4 oz)   PHYSICAL EXAM: General:  NAD. No resp difficulty, walked into clinic HEENT: Normal Neck: Supple. No JVD. Carotids 2+ bilat; no bruits. No lymphadenopathy or thryomegaly appreciated. Cor: PMI nondisplaced. Regular rate & rhythm. No rubs, gallops or murmurs. Lungs: Clear; sternal incision approximated, no drainage. Abdomen: Soft, nontender, nondistended. No hepatosplenomegaly. No bruits or masses. Good bowel sounds. Chest tube sutures in place. Extremities: No cyanosis, clubbing, rash, trace RLE edema Neuro: Alert & oriented x 3, cranial nerves grossly intact. Moves all 4 extremities w/o difficulty. Affect pleasant.  ECG: SR 82 bpm (personally reviewed).  ASSESSMENT & PLAN:  1. Chronic Systolic Heart Failure - Ischemic CM 2/2 3V CAD - Echo 12/22: EF 30-35%, RV normal  - RHC 12/22:  w/ Fick cardiac output/index = 7.3/3.5 - now s/p CABG - NYHA II, volume looks good today. - Start Farxiga 10 mg daily. - Change Lasix to 20 mg daily PRN weight gain/swelling (he had been taking this daily). - Continue spironolactone 12.5 mg daily. - Continue Entresto 24/26 mg  bid. - Continue Coreg 3.125 mg bid. - BMET today, repeat in 10-14 days (Rx given for [facility] in [place])  2. Multivessel CAD - s/p CABG x 5 (LIMA-LAD, SVG-Diag, sequential SVG-OM/OM2 and SVG-PDA) on 12/16.  - Continue ASA. - Continue pravastatin 40 mg daily, GI upset with other statins. - Check Lipids next visit, low threshold for adding Repatha.   - CR when OK with surgery.   3. Anemia  - Hgb 7.5> 7.4 > 7.5 - on iron - CBC today.   4. Type 2DM  - Not currently on insulin. - A1c 6.6 (12/22) - Start SGLT2i as above.  5. Snoring - Consider home sleep study next visit   Follow up with APP in 4 weeks (increase Entresto, spiro or carvedilol) and Dr. Haroldine Laws in 12 weeks with echo.  Allena Katz, FNP-BC 10/13/21

## 2021-10-13 ENCOUNTER — Other Ambulatory Visit: Payer: Self-pay

## 2021-10-13 ENCOUNTER — Other Ambulatory Visit (HOSPITAL_COMMUNITY): Payer: Self-pay

## 2021-10-13 ENCOUNTER — Encounter (HOSPITAL_COMMUNITY): Payer: Self-pay

## 2021-10-13 ENCOUNTER — Ambulatory Visit (HOSPITAL_COMMUNITY)
Admit: 2021-10-13 | Discharge: 2021-10-13 | Disposition: A | Discharge status: Home / Self Care | Payer: BC Managed Care – PPO | Attending: Family Medicine | Admitting: Family Medicine

## 2021-10-13 ENCOUNTER — Telehealth (HOSPITAL_COMMUNITY): Payer: Self-pay | Admitting: Surgery

## 2021-10-13 VITALS — BP 106/56 | HR 81 | Ht 69.0 in | Wt 208.8 lb

## 2021-10-13 DIAGNOSIS — I110 Hypertensive heart disease with heart failure: Secondary | ICD-10-CM | POA: Insufficient documentation

## 2021-10-13 DIAGNOSIS — D649 Anemia, unspecified: Secondary | ICD-10-CM | POA: Insufficient documentation

## 2021-10-13 DIAGNOSIS — I2510 Atherosclerotic heart disease of native coronary artery without angina pectoris: Secondary | ICD-10-CM | POA: Insufficient documentation

## 2021-10-13 DIAGNOSIS — M79606 Pain in leg, unspecified: Secondary | ICD-10-CM | POA: Insufficient documentation

## 2021-10-13 DIAGNOSIS — I509 Heart failure, unspecified: Secondary | ICD-10-CM | POA: Diagnosis not present

## 2021-10-13 DIAGNOSIS — E1159 Type 2 diabetes mellitus with other circulatory complications: Secondary | ICD-10-CM

## 2021-10-13 DIAGNOSIS — K30 Functional dyspepsia: Secondary | ICD-10-CM | POA: Insufficient documentation

## 2021-10-13 DIAGNOSIS — R0683 Snoring: Secondary | ICD-10-CM | POA: Insufficient documentation

## 2021-10-13 DIAGNOSIS — Z79899 Other long term (current) drug therapy: Secondary | ICD-10-CM | POA: Insufficient documentation

## 2021-10-13 DIAGNOSIS — E1142 Type 2 diabetes mellitus with diabetic polyneuropathy: Secondary | ICD-10-CM | POA: Insufficient documentation

## 2021-10-13 DIAGNOSIS — I5022 Chronic systolic (congestive) heart failure: Secondary | ICD-10-CM | POA: Diagnosis not present

## 2021-10-13 DIAGNOSIS — Z951 Presence of aortocoronary bypass graft: Secondary | ICD-10-CM | POA: Insufficient documentation

## 2021-10-13 LAB — BASIC METABOLIC PANEL
Anion gap: 8 (ref 5–15)
BUN: 25 mg/dL — ABNORMAL HIGH (ref 6–20)
CO2: 24 mmol/L (ref 22–32)
Calcium: 8.7 mg/dL — ABNORMAL LOW (ref 8.9–10.3)
Chloride: 103 mmol/L (ref 98–111)
Creatinine, Ser: 1.3 mg/dL — ABNORMAL HIGH (ref 0.61–1.24)
GFR, Estimated: >60 mL/min (ref >60)
Glucose, Bld: 176 mg/dL — ABNORMAL HIGH (ref 70–99)
Potassium: 5 mmol/L (ref 3.5–5.1)
Sodium: 135 mmol/L (ref 135–145)

## 2021-10-13 LAB — CBC
HCT: 26.2 % — ABNORMAL LOW (ref 39.0–52.0)
Hemoglobin: 8.7 g/dL — ABNORMAL LOW (ref 13.0–17.0)
MCH: 30.1 pg (ref 26.0–34.0)
MCHC: 33.2 g/dL (ref 30.0–36.0)
MCV: 90.7 fL (ref 80.0–100.0)
Platelets: 242 10*3/uL (ref 150–400)
RBC: 2.89 MIL/uL — ABNORMAL LOW (ref 4.22–5.81)
RDW: 12.9 % (ref 11.5–15.5)
WBC: 5.8 10*3/uL (ref 4.0–10.5)
nRBC: 0 % (ref 0.0–0.2)

## 2021-10-13 LAB — BRAIN NATRIURETIC PEPTIDE: B Natriuretic Peptide: 921.1 pg/mL — ABNORMAL HIGH (ref 0.0–100.0)

## 2021-10-13 MED ORDER — FUROSEMIDE 20 MG PO TABS: 20.0000 mg | ORAL_TABLET | Freq: Every day | ORAL | 3 refills | Status: DC

## 2021-10-13 MED ORDER — DAPAGLIFLOZIN PROPANEDIOL 10 MG PO TABS: 10.0000 mg | ORAL_TABLET | Freq: Every day | ORAL | 3 refills | Status: DC

## 2021-10-13 MED ORDER — FUROSEMIDE 20 MG PO TABS: 20.0000 mg | ORAL_TABLET | ORAL | 3 refills | Status: DC | PRN

## 2021-10-13 MED ORDER — FUROSEMIDE 20 MG PO TABS: 20.0000 mg | ORAL_TABLET | Freq: Every day | ORAL | 3 refills | Status: DC | PRN

## 2021-10-13 NOTE — Telephone Encounter (Signed)
I called patient and reviewed labs as well as recommendations per Prince Rome NP.  He is aware and will continue taking Lasix 20 mg daily.  I have updated medication list in [facility].

## 2021-10-13 NOTE — Telephone Encounter (Signed)
-----   Message from Jacklynn Ganong, [place] sent at 10/13/2021  1:20 PM EST ----- BNP remains elevated. Please keep taking Lasix 20 mg daily, do not change to PRN as discussed today. Will repeat labs at follow up appt.

## 2021-10-13 NOTE — Patient Instructions (Signed)
Take Lasix daily as needed for weight gain.  Start Farxiga 10 mg daily  Labs done today, your results will be available in MyChart, we will contact you for abnormal readings.  Prescription given to patient for lab work to be drawn in 10 days.  Your physician has requested that you have an echocardiogram. Echocardiography is a painless test that uses sound waves to create images of your heart. It provides your doctor with information about the size and shape of your heart and how well your hearts chambers and valves are working. This procedure takes approximately one hour. There are no restrictions for this procedure.   Your physician recommends that you schedule a follow-up appointment in: 4 weeks with app clinic  12 weeks with echocardiogram with Dr.Bensimhon  If you have any questions or concerns before your next appointment please send [place] a message through [place] or call our office at [phone].    TO LEAVE A MESSAGE FOR THE NURSE SELECT OPTION 2, PLEASE LEAVE A MESSAGE INCLUDING: YOUR NAME DATE OF BIRTH CALL BACK NUMBER REASON FOR CALL**this is important as we prioritize the call backs  YOU WILL RECEIVE A CALL BACK THE SAME DAY AS LONG AS YOU CALL BEFORE 4:00 PM  At the [facility], you and your health needs are our priority. As part of our continuing mission to provide you with exceptional heart care, we have created designated Provider Care Teams. These Care Teams include your primary Cardiologist (physician) and Advanced Practice Providers (APPs- Physician Assistants and Nurse Practitioners) who all work together to provide you with the care you need, when you need it.   You may see any of the following providers on your designated Care Team at your next follow up: Dr Arvilla Meres Dr Carron Curie, NP Robbie Lis, [place] [facility] [place], [place] Karle Plumber, PharmD   Please be sure to bring in all your medications bottles  to every appointment.

## 2021-10-18 MED FILL — Albumin, Human Inj 5%: INTRAVENOUS | Qty: 250 | Status: AC

## 2021-10-18 MED FILL — Electrolyte-R (PH 7.4) Solution: INTRAVENOUS | Qty: 4000 | Status: AC

## 2021-10-18 MED FILL — Lidocaine HCl (Cardiac) IV PF Soln 100 MG/5ML (2%): INTRAVENOUS | Qty: 5 | Status: AC

## 2021-10-18 MED FILL — Sodium Bicarbonate IV Soln 8.4%: INTRAVENOUS | Qty: 50 | Status: AC

## 2021-10-18 MED FILL — Sodium Chloride IV Soln 0.9%: INTRAVENOUS | Qty: 2000 | Status: AC

## 2021-10-18 MED FILL — Mannitol IV Soln 20%: INTRAVENOUS | Qty: 500 | Status: AC

## 2021-10-20 ENCOUNTER — Other Ambulatory Visit: Payer: Self-pay

## 2021-10-20 ENCOUNTER — Encounter (INDEPENDENT_AMBULATORY_CARE_PROVIDER_SITE_OTHER): Payer: Self-pay

## 2021-10-20 DIAGNOSIS — Z4802 Encounter for removal of sutures: Secondary | ICD-10-CM

## 2021-10-20 DIAGNOSIS — G8918 Other acute postprocedural pain: Secondary | ICD-10-CM

## 2021-10-20 MED ORDER — TRAMADOL HCL 50 MG PO TABS: 50.0000 mg | ORAL_TABLET | ORAL | 0 refills | Status: DC | PRN

## 2021-10-20 NOTE — Telephone Encounter (Signed)
RX refill for Tramadol called into [facility]

## 2021-10-24 ENCOUNTER — Other Ambulatory Visit: Payer: Self-pay | Admitting: Internal Medicine

## 2021-10-25 DIAGNOSIS — H4312 Vitreous hemorrhage, left eye: Secondary | ICD-10-CM | POA: Diagnosis not present

## 2021-10-25 DIAGNOSIS — H35371 Puckering of macula, right eye: Secondary | ICD-10-CM | POA: Diagnosis not present

## 2021-10-25 DIAGNOSIS — E113511 Type 2 diabetes mellitus with proliferative diabetic retinopathy with macular edema, right eye: Secondary | ICD-10-CM | POA: Diagnosis not present

## 2021-10-25 DIAGNOSIS — H53132 Sudden visual loss, left eye: Secondary | ICD-10-CM | POA: Diagnosis not present

## 2021-10-25 LAB — BASIC METABOLIC PANEL
BUN/Creatinine Ratio: 20 (ref 9–20)
BUN: 26 mg/dL — ABNORMAL HIGH (ref 6–24)
CO2: 20 mmol/L (ref 20–29)
Calcium: 9.4 mg/dL (ref 8.7–10.2)
Chloride: 104 mmol/L (ref 96–106)
Creatinine, Ser: 1.28 mg/dL — ABNORMAL HIGH (ref 0.76–1.27)
Glucose: 159 mg/dL — ABNORMAL HIGH (ref 70–99)
Potassium: 5 mmol/L (ref 3.5–5.2)
Sodium: 141 mmol/L (ref 134–144)
eGFR: 65 mL/min/{1.73_m2} (ref >59)

## 2021-10-26 ENCOUNTER — Telehealth (HOSPITAL_COMMUNITY): Payer: Self-pay | Admitting: *Deleted

## 2021-10-26 NOTE — Telephone Encounter (Signed)
Pt called c/o lightheadedness and dizziness 2 weeks post quadruple bypass. Pt states he also vomited in the shower this am. Pt advised per Dinah Beers to go to the emergency room for evaluation. Pt agreed with plan.

## 2021-11-01 ENCOUNTER — Other Ambulatory Visit: Payer: Self-pay | Admitting: Physician Assistant

## 2021-11-01 ENCOUNTER — Ambulatory Visit: Payer: BC Managed Care – PPO

## 2021-11-07 ENCOUNTER — Other Ambulatory Visit: Payer: Self-pay | Admitting: Surgery

## 2021-11-07 DIAGNOSIS — Z951 Presence of aortocoronary bypass graft: Secondary | ICD-10-CM

## 2021-11-08 ENCOUNTER — Other Ambulatory Visit: Payer: Self-pay

## 2021-11-08 ENCOUNTER — Ambulatory Visit
Admission: RE | Admit: 2021-11-08 | Discharge: 2021-11-08 | Disposition: A | Discharge status: Home / Self Care | Payer: No Typology Code available for payment source | Source: Ambulatory Visit | Attending: Surgery | Admitting: Surgery

## 2021-11-08 ENCOUNTER — Ambulatory Visit (INDEPENDENT_AMBULATORY_CARE_PROVIDER_SITE_OTHER): Payer: Self-pay | Admitting: Physician Assistant

## 2021-11-08 VITALS — BP 104/68 | HR 99 | Resp 20 | Ht 69.0 in | Wt 189.0 lb

## 2021-11-08 DIAGNOSIS — Z951 Presence of aortocoronary bypass graft: Secondary | ICD-10-CM

## 2021-11-08 MED ORDER — PRAVASTATIN SODIUM 40 MG PO TABS: 40.0000 mg | ORAL_TABLET | Freq: Every day | ORAL | 3 refills | Status: DC

## 2021-11-08 MED ORDER — SPIRONOLACTONE 25 MG PO TABS: 12.5000 mg | ORAL_TABLET | Freq: Every day | ORAL | 3 refills | Status: DC

## 2021-11-08 MED ORDER — ENTRESTO 24-26 MG PO TABS: 1.0000 | ORAL_TABLET | Freq: Two times a day (BID) | ORAL | 3 refills | Status: DC

## 2021-11-08 MED ORDER — CARVEDILOL 3.125 MG PO TABS: 3.1250 mg | ORAL_TABLET | Freq: Two times a day (BID) | ORAL | 3 refills | Status: DC

## 2021-11-08 MED ORDER — METFORMIN HCL ER 750 MG PO TB24: 750.0000 mg | ORAL_TABLET | Freq: Two times a day (BID) | ORAL | 3 refills | Status: AC

## 2021-11-08 MED ORDER — GABAPENTIN 300 MG PO CAPS: 900.0000 mg | ORAL_CAPSULE | Freq: Every day | ORAL | 0 refills | Status: AC

## 2021-11-08 NOTE — Patient Instructions (Signed)
Make every effort to maintain a "heart-healthy" lifestyle with regular physical exercise and adherence to a low-fat, low-carbohydrate diet.  Continue to seek regular follow-up appointments with your primary care physician and/or cardiologist.  You may return to driving an automobile as long as you are no longer requiring oral narcotic pain relievers during the daytime.  It would be wise to start driving only short distances during the daylight and gradually increase from there as you feel comfortable.  You may continue to gradually increase your physical activity as tolerated.  Refrain from any heavy lifting or strenuous use of your arms and shoulders until at least 8 weeks from the time of your surgery, and avoid activities that cause increased pain in your chest on the side of your surgical incision.  Otherwise you may continue to increase activities without any particular limitations.  Increase the intensity and duration of physical activity gradually.  You are encouraged to enroll and participate in the outpatient cardiac rehab program beginning as soon as practical.

## 2021-11-08 NOTE — Progress Notes (Signed)
[address]       [name] [address]             [phone]    HPI: Patient returns for routine postoperative follow-up having undergone CABG x 5 on 10/06/2021. The patient's early postoperative recovery while in the hospital was notable for inotropic support with Milrinone.  Advanced heart failure team optimized his medical regimen. The patient was evaluated in the Advanced heart failure clinic on 10/13/2021.  At that time the patient was doing well.  He was felt to be euvolemic and was transitioned to a prn schedule of Lasix.  His medication regimen was further adjusted.  He was noted to be anemic with a Hgb at 7.5, repeat CBC was obtained and showed improvement to 8.5 on iron.  Since hospital discharge the patient reports he is doing well.  His only complaint is that he isn't sleeping very well.  He states he is having a lot of trouble falling asleep.  He states he sleeps 7-8 hours once he get asleep.  He is taking naps during the day.  He continues to have some discomfort along his sternotomy.  He has been using Aleve for this, he has been advised to use Tylenol in stead.  He is ambulating several times per day in his sister's [place].  He is hoping we give him permission to return home as he has been staying with his sister.  He is also interested in cardiac rehabilitation.  Finally he states he is out of all his medications and is requesting refills.    Current Outpatient Medications  Medication Sig Dispense Refill   aspirin EC 325 MG EC tablet Take 1 tablet (325 mg total) by mouth daily. 30 tablet 0   carvedilol (COREG) 3.125 MG tablet Take 1 tablet (3.125 mg total) by mouth 2 (two) times daily with a meal. 60 tablet 0   dapagliflozin propanediol (FARXIGA) 10 MG TABS tablet Take 1 tablet (10 mg total) by mouth daily before breakfast. 30 tablet 3   furosemide (LASIX) 20 MG tablet Take 1 tablet (20 mg total) by mouth daily. 30 tablet 3   gabapentin (NEURONTIN) 300 MG  capsule Take 900 mg by mouth at bedtime.     metFORMIN (GLUCOPHAGE-XR) 750 MG 24 hr tablet Take 750 mg by mouth 2 (two) times daily.     OVER THE COUNTER MEDICATION Take 1 capsule by mouth at bedtime as needed (sleep). CBD gummies     pravastatin (PRAVACHOL) 40 MG tablet Take 1 tablet (40 mg total) by mouth daily at 6 PM. 30 tablet 0   spironolactone (ALDACTONE) 25 MG tablet Take 0.5 tablets (12.5 mg total) by mouth daily. 15 tablet 0   traMADol (ULTRAM) 50 MG tablet Take 1 tablet (50 mg total) by mouth every 4 (four) hours as needed for moderate pain. 28 tablet 0   No current facility-administered medications for this visit.    Physical Exam:  BP 104/68 (BP Location: Left Arm, Patient Position: Sitting)    Pulse 99    Resp 20    Ht 5\' 9"  (1.753 m)    Wt 189 lb (85.7 kg)    SpO2 98% Comment: RA   BMI 27.91 kg/m   Gen: NAD  Heart:RRR Lungs:CTA bilaterally distended Ext:no edema Incisions:clean and dry, some eschar remains on EVH sites and chest tube sites   Diagnostic Tests:  CXR: post surgical changes, no pleural effusions, surgical wires are  intact   A/P:  S/p CABG x 5   10/06/2021- overall doing well, medications adjusted at last heart failure visit. Chronic Systolic Heart Failure, reduced EF of 30-35% monitored by Advanced heart failure clinic, next follow up is on 1/20 Medication refills- provided refills for Entresto, Pravachol, Coreg, Spironolactone, Metformin, and lastly Gabapentin.  I instructed patient I would only be willing to give him a 1 month supply of Gabapentin and he will need to follow up with his family doctor for further refills... I suspect heart failure clinic will provide further cardiac refills when needed Activity-discussed with patient.. cleared to resume driving if no longer using narcotics... re-educated on sternal precautions... He is okay to start cardiac rehabilitation.. Also gave patient the okay to move back to his home.  He no longer  requires 24 hr supervision RTC in 4 weeks to ensure patient continues to make progress and sleeping habits have improved   Lowella Dandy, PA-C [facility] [phone]

## 2021-11-10 ENCOUNTER — Encounter (HOSPITAL_COMMUNITY): Payer: Self-pay

## 2021-11-10 ENCOUNTER — Other Ambulatory Visit: Payer: Self-pay

## 2021-11-10 ENCOUNTER — Ambulatory Visit (HOSPITAL_COMMUNITY)
Admission: RE | Admit: 2021-11-10 | Discharge: 2021-11-10 | Disposition: A | Discharge status: Home / Self Care | Payer: BC Managed Care – PPO | Source: Ambulatory Visit | Attending: Family Medicine | Admitting: Family Medicine

## 2021-11-10 ENCOUNTER — Other Ambulatory Visit (HOSPITAL_COMMUNITY): Payer: Self-pay | Admitting: Family Medicine

## 2021-11-10 VITALS — BP 118/76 | HR 92 | Wt 193.2 lb

## 2021-11-10 DIAGNOSIS — E1159 Type 2 diabetes mellitus with other circulatory complications: Secondary | ICD-10-CM

## 2021-11-10 DIAGNOSIS — Z7984 Long term (current) use of oral hypoglycemic drugs: Secondary | ICD-10-CM | POA: Insufficient documentation

## 2021-11-10 DIAGNOSIS — I5022 Chronic systolic (congestive) heart failure: Secondary | ICD-10-CM | POA: Insufficient documentation

## 2021-11-10 DIAGNOSIS — I3139 Other pericardial effusion (noninflammatory): Secondary | ICD-10-CM | POA: Insufficient documentation

## 2021-11-10 DIAGNOSIS — I2510 Atherosclerotic heart disease of native coronary artery without angina pectoris: Secondary | ICD-10-CM | POA: Diagnosis not present

## 2021-11-10 DIAGNOSIS — Z79899 Other long term (current) drug therapy: Secondary | ICD-10-CM | POA: Insufficient documentation

## 2021-11-10 DIAGNOSIS — Z7982 Long term (current) use of aspirin: Secondary | ICD-10-CM | POA: Insufficient documentation

## 2021-11-10 DIAGNOSIS — R0683 Snoring: Secondary | ICD-10-CM | POA: Insufficient documentation

## 2021-11-10 DIAGNOSIS — E119 Type 2 diabetes mellitus without complications: Secondary | ICD-10-CM | POA: Insufficient documentation

## 2021-11-10 DIAGNOSIS — D509 Iron deficiency anemia, unspecified: Secondary | ICD-10-CM | POA: Insufficient documentation

## 2021-11-10 DIAGNOSIS — E785 Hyperlipidemia, unspecified: Secondary | ICD-10-CM | POA: Diagnosis not present

## 2021-11-10 DIAGNOSIS — Z951 Presence of aortocoronary bypass graft: Secondary | ICD-10-CM | POA: Insufficient documentation

## 2021-11-10 DIAGNOSIS — I110 Hypertensive heart disease with heart failure: Secondary | ICD-10-CM | POA: Diagnosis not present

## 2021-11-10 DIAGNOSIS — D649 Anemia, unspecified: Secondary | ICD-10-CM

## 2021-11-10 LAB — BASIC METABOLIC PANEL
Anion gap: 5 (ref 5–15)
BUN: 26 mg/dL — ABNORMAL HIGH (ref 6–20)
CO2: 24 mmol/L (ref 22–32)
Calcium: 9.2 mg/dL (ref 8.9–10.3)
Chloride: 110 mmol/L (ref 98–111)
Creatinine, Ser: 1.26 mg/dL — ABNORMAL HIGH (ref 0.61–1.24)
GFR, Estimated: >60 mL/min (ref >60)
Glucose, Bld: 202 mg/dL — ABNORMAL HIGH (ref 70–99)
Potassium: 5.3 mmol/L — ABNORMAL HIGH (ref 3.5–5.1)
Sodium: 139 mmol/L (ref 135–145)

## 2021-11-10 LAB — LIPID PANEL
Cholesterol: 170 mg/dL (ref 0–200)
HDL: 47 mg/dL (ref >40)
LDL Cholesterol: 106 mg/dL — ABNORMAL HIGH (ref 0–99)
Total CHOL/HDL Ratio: 3.6 RATIO
Triglycerides: 85 mg/dL (ref <150)
VLDL: 17 mg/dL (ref 0–40)

## 2021-11-10 LAB — BRAIN NATRIURETIC PEPTIDE: B Natriuretic Peptide: 462.9 pg/mL — ABNORMAL HIGH (ref 0.0–100.0)

## 2021-11-10 MED ORDER — DAPAGLIFLOZIN PROPANEDIOL 10 MG PO TABS: 10.0000 mg | ORAL_TABLET | Freq: Every day | ORAL | 3 refills | Status: DC

## 2021-11-10 MED ORDER — FUROSEMIDE 20 MG PO TABS: 20.0000 mg | ORAL_TABLET | Freq: Every day | ORAL | 3 refills | Status: DC

## 2021-11-10 MED ORDER — PRAVASTATIN SODIUM 40 MG PO TABS: 40.0000 mg | ORAL_TABLET | Freq: Every day | ORAL | 3 refills | Status: DC

## 2021-11-10 MED ORDER — SPIRONOLACTONE 25 MG PO TABS: 12.5000 mg | ORAL_TABLET | Freq: Every day | ORAL | 3 refills | Status: DC

## 2021-11-10 MED ORDER — ASPIRIN 325 MG PO TBEC: 325.0000 mg | DELAYED_RELEASE_TABLET | Freq: Every day | ORAL | 3 refills | Status: DC

## 2021-11-10 MED ORDER — ENTRESTO 24-26 MG PO TABS: 1.0000 | ORAL_TABLET | Freq: Two times a day (BID) | ORAL | 3 refills | Status: DC

## 2021-11-10 MED ORDER — CARVEDILOL 3.125 MG PO TABS
3.1250 mg | ORAL_TABLET | Freq: Two times a day (BID) | ORAL | 3 refills | Status: DC
Start: 2021-11-10 — End: 2022-05-09

## 2021-11-10 NOTE — Patient Instructions (Signed)
Thank you for coming in today  Labs were done today, if any labs are abnormal the clinic will call you  Please notify clinic if you will be staying in [place] longer than a month  Your physician recommends that you schedule a follow-up appointment in: please keep follow up with Dr. Gala Romney  At the Advanced Heart Failure Clinic, you and your health needs are our priority. As part of our continuing mission to provide you with exceptional heart care, we have created designated Provider Care Teams. These Care Teams include your primary Cardiologist (physician) and Advanced Practice Providers (APPs- Physician Assistants and Nurse Practitioners) who all work together to provide you with the care you need, when you need it.   You may see any of the following providers on your designated Care Team at your next follow up: Dr Arvilla Meres Dr Carron Curie, NP Robbie Lis, [place] [facility] [place], [place] Karle Plumber, PharmD   Please be sure to bring in all your medications bottles to every appointment.   If you have any questions or concerns before your next appointment please send [place] a message through [place] or call our office at [phone].    TO LEAVE A MESSAGE FOR THE NURSE SELECT OPTION 2, PLEASE LEAVE A MESSAGE INCLUDING: YOUR NAME DATE OF BIRTH CALL BACK NUMBER REASON FOR CALL**this is important as we prioritize the call backs  YOU WILL RECEIVE A CALL BACK THE SAME DAY AS LONG AS YOU CALL BEFORE 4:00 PM

## 2021-11-10 NOTE — Progress Notes (Signed)
ADVANCED HF CLINIC CONSULT NOTE   Primary Care: [name], MD CT Surgeon: Dr. Laneta Simmers HF Cardiologist: Dr. Gala Romney  HPI: Brett Walker is a 61 y.o.male w/ HTN, HLD, T2DM and newly diagnosed systolic heart failure/ ischemic CM and multivessel disease.   Admitted 12/22 for chest pain. Echo EF 30-35%, GIIDD, RV normal. R/LHC showed severe 3VCAD (see angiographic details below), low filling pressures and normal CO, Fick cardiac output/index = 7.3/3.5. Referred for CABG. Underwent CABG x 5 12/22(LIMA-LAD, SVG-Diag, sequential SVG-OM/OM2 and SVG-PDA). AHF consulted to assist with management. He was discharged home on spiro, Entresto, carvedilol. Discharged to stay with sister in [place], weight 204 lbs.  Today he returns for HF follow up. He has been cleared to start CR. Continues to walk in the neighborhood and goes up stairs without dyspnea. Overall feeling fine. Denies palpitations, abnormal bleeding, CP, dizziness, edema, or PND/Orthopnea. Appetite ok. No fever or chills. Weight at home 189-190 pounds. He has been out of his HF meds x 3 days. Remains at his sister's house in [place], hopes to move back home to [place] in next week or so.  Cardiac Studies - Echo (12/22): EF 30-35%, global HK, RV normal, small pericardial effusion.  - R/LHC (12/22):   Ost RCA to Prox RCA lesion is 90% stenosed.   Dist RCA lesion is 99% stenosed.   RPDA lesion is 100% stenosed.   RPAV lesion is 100% stenosed.   Ost Cx to Prox Cx lesion is 80% stenosed.   1st Mrg lesion is 95% stenosed.   2nd Mrg lesion is 95% stenosed.   Prox LAD to Mid LAD lesion is 80% stenosed.   1st Diag lesion is 100% stenosed.   2nd Diag lesion is 99% stenosed.   Mid LAD to Dist LAD lesion is 75% stenosed.   The left ventricular ejection fraction is 25-35% by visual estimate. Findings:   Ao = 101/71 (88) LV = 102/5 RA =  2 RV = 27/5 PA = 30/12 (19) PCW = 2 Fick cardiac output/index = 7.3/3.5 PVR = 2.2 WU SVR = 941 Ao sat  = 95% PA sat = 72%, 73% SVC sat = 75% Assessment: 1. Severe 3v CAD 2. Low filling pressures with normal cardiac output 3. iCM EF ~25% 4. There was difficulty passing swan across pulmonary valve but no significant pulmonic stenosis (may be just angle of PA outflow tract)  ROS: All systems reviewed and negative except as per HPI.   Past Medical History:  Diagnosis Date   Cardiomyopathy (HCC)    CHF (congestive heart failure) (HCC)    Coronary artery disease    Diabetes mellitus without complication (HCC)    Diabetic peripheral neuropathy (HCC)    Diabetic retinopathy (HCC)    Diverticulitis    Dyspnea    Elevated LDL cholesterol level    Family history of adverse reaction to anesthesia    Patient states his dad "woke up crazy from anesthesia and had to be restrained"   Hypertension    Pharyngoesophageal dysphagia    Current Outpatient Medications  Medication Sig Dispense Refill   dapagliflozin propanediol (FARXIGA) 10 MG TABS tablet Take 1 tablet (10 mg total) by mouth daily before breakfast. 30 tablet 3   metFORMIN (GLUCOPHAGE-XR) 750 MG 24 hr tablet Take 1 tablet (750 mg total) by mouth 2 (two) times daily. 60 tablet 3   aspirin EC 325 MG EC tablet Take 1 tablet (325 mg total) by mouth daily. (Patient not taking: Reported on 11/10/2021)  30 tablet 0   carvedilol (COREG) 3.125 MG tablet Take 1 tablet (3.125 mg total) by mouth 2 (two) times daily with a meal. (Patient not taking: Reported on 11/10/2021) 60 tablet 3   furosemide (LASIX) 20 MG tablet Take 1 tablet (20 mg total) by mouth daily. (Patient not taking: Reported on 11/10/2021) 30 tablet 3   gabapentin (NEURONTIN) 300 MG capsule Take 3 capsules (900 mg total) by mouth at bedtime. (Patient not taking: Reported on 11/10/2021) 90 capsule 0   OVER THE COUNTER MEDICATION Take 1 capsule by mouth at bedtime as needed (sleep). CBD gummies (Patient not taking: Reported on 11/10/2021)     pravastatin (PRAVACHOL) 40 MG tablet Take 1 tablet  (40 mg total) by mouth daily at 6 PM. (Patient not taking: Reported on 11/10/2021) 30 tablet 3   sacubitril-valsartan (ENTRESTO) 24-26 MG Take 1 tablet by mouth 2 (two) times daily. (Patient not taking: Reported on 11/10/2021) 60 tablet 3   spironolactone (ALDACTONE) 25 MG tablet Take 0.5 tablets (12.5 mg total) by mouth daily. (Patient not taking: Reported on 11/10/2021) 15 tablet 3   No current facility-administered medications for this encounter.   Allergies  Allergen Reactions   Penicillin G Hives   Atorvastatin     fecal urgency   Lipitor [Atorvastatin Calcium] Diarrhea   Other Other (See Comments)   Ozempic (0.25 Or 0.5 Mg-Dose) [Semaglutide(0.25 Or 0.5mg -Dos)] Diarrhea   Rosuvastatin Other (See Comments)    GI cramping   Social History   Socioeconomic History   Marital status: Single    Spouse name: Not on file   Number of children: 0   Years of education: 14   Highest education level: Not on file  Occupational History   Occupation: [employer]  Tobacco Use   Smoking status: Never   Smokeless tobacco: Never  Vaping Use   Vaping Use: Never used  Substance and Sexual Activity   Alcohol use: Not Currently    Comment: h/o heavy use   Drug use: Never   Sexual activity: Not on file  Other Topics Concern   Not on file  Social History Narrative   Works at [employer]    Single   Right handed   First floor apartment   Social Determinants of Health   Financial Resource Strain: Not on file  Food Insecurity: Not on file  Transportation Needs: Not on file  Physical Activity: Not on file  Stress: Not on file  Social Connections: Not on file  Intimate Partner Violence: Not on file   Family History  Problem Relation Age of Onset   Parkinson's disease Father    CAD Father    Hydrocephalus Brother    CAD Paternal Grandfather    BP 118/76    Pulse 92    Wt 87.6 kg (193 lb 3.2 oz)    SpO2 99%    BMI 28.53 kg/m   Wt Readings from Last 3 Encounters:  11/10/21 87.6 kg (193 lb 3.2  oz)  11/08/21 85.7 kg (189 lb)  10/13/21 94.7 kg (208 lb 12.8 oz)   PHYSICAL EXAM: General:  NAD. No resp difficulty HEENT: Normal Neck: Supple. No JVD. Carotids 2+ bilat; no bruits. No lymphadenopathy or thryomegaly appreciated. Cor: PMI nondisplaced. Regular rate & rhythm. No rubs, gallops or murmurs. Lungs: Clear Abdomen: Soft, nontender, nondistended. No hepatosplenomegaly. No bruits or masses. Good bowel sounds. Extremities: No cyanosis, clubbing, rash, edema Neuro: Alert & oriented x 3, cranial nerves grossly intact. Moves all 4 extremities w/o  difficulty. Affect pleasant.  ASSESSMENT & PLAN:  1. Chronic Systolic Heart Failure - Ischemic CM 2/2 3V CAD - Echo 12/22: EF 30-35%, RV normal  - RHC 12/22: w/ Fick cardiac output/index = 7.3/3.5 - now s/p CABG - NYHA II, volume looks good today. Will not titrate meds today as he's been out x 3 days. - Restart Farxiga 10 mg daily. - Continue Lasix 20 mg PRN - Restart spironolactone 12.5 mg daily. - Restart Entresto 24/26 mg bid. - Restart Coreg 3.125 mg bid. - Echo next appt. - BMET/BNP today  2. Multivessel CAD - s/p CABG x 5 (LIMA-LAD, SVG-Diag, sequential SVG-OM/OM2 and SVG-PDA) on 12/16.  - Continue ASA. - Continue pravastatin 40 mg daily, GI upset with other statins. - Check Lipids today, low threshold for adding Repatha.   - CR.   3. Anemia  - On Iron suppl. - Last Hgb 8.7   4. Type 2DM  - Not currently on insulin. - A1c 6.6 (12/22) - On SGLT2i.  5. Snoring - Discussed sleep study today, he wants to hold off.  I asked him to notify clinic if he is going to be staying in [place] > 1 month, if so we can send his Rx to a pharmacy near him. Discussed importance of medication compliance.   Follow up with Dr. Gala RomneyBensimhon + echo in 2 months as scheduled.  Prince RomeJessica Treylan Mcclintock, FNP-BC 11/10/21

## 2021-11-13 ENCOUNTER — Telehealth (HOSPITAL_COMMUNITY): Payer: Self-pay

## 2021-11-13 NOTE — Telephone Encounter (Addendum)
Rx Mailed to patient per his request.  ----- Message from Rafael Bihari, FNP sent at 11/10/2021  4:51 PM EST ----- K elevated. Do not restart spiro. Reduce K-rich foods in diet. Repeat BMET in 1 week.

## 2021-11-16 ENCOUNTER — Telehealth: Payer: Self-pay

## 2021-11-16 NOTE — Telephone Encounter (Signed)
FMLA  form completed and faxed to [phone]. Beginning LOA 10/06/21 through 01/01/22. Form mailed to pt's home address.

## 2021-11-18 ENCOUNTER — Other Ambulatory Visit: Payer: Self-pay | Admitting: Physician Assistant

## 2021-11-18 ENCOUNTER — Other Ambulatory Visit (HOSPITAL_COMMUNITY): Payer: Self-pay | Admitting: Family Medicine

## 2021-11-24 ENCOUNTER — Telehealth (HOSPITAL_COMMUNITY): Payer: Self-pay

## 2021-11-24 ENCOUNTER — Other Ambulatory Visit (HOSPITAL_COMMUNITY): Payer: Self-pay | Admitting: Family Medicine

## 2021-11-24 DIAGNOSIS — I5022 Chronic systolic (congestive) heart failure: Secondary | ICD-10-CM | POA: Diagnosis not present

## 2021-11-24 DIAGNOSIS — E7800 Pure hypercholesterolemia, unspecified: Secondary | ICD-10-CM | POA: Diagnosis not present

## 2021-11-24 DIAGNOSIS — E1169 Type 2 diabetes mellitus with other specified complication: Secondary | ICD-10-CM | POA: Diagnosis not present

## 2021-11-24 DIAGNOSIS — E1142 Type 2 diabetes mellitus with diabetic polyneuropathy: Secondary | ICD-10-CM | POA: Diagnosis not present

## 2021-11-24 DIAGNOSIS — I10 Essential (primary) hypertension: Secondary | ICD-10-CM | POA: Diagnosis not present

## 2021-11-24 NOTE — Telephone Encounter (Signed)
Pt insurance is active and benefits verified through [place]. Co-pay $35.00, DED $3,000.00/$0.00 met, out of pocket $6,350.00/$152.97 met, co-insurance 0%. No pre-authorization required. Passport, 11/24/21 @ 3:28PM, ZOX#[id number]

## 2021-11-24 NOTE — Telephone Encounter (Signed)
Called patient to see if he was interested in participating in the Cardiac Rehab Program. Patient stated yes. Patient will come in for orientation on 12/21/21 @ 830AM and will attend the 830AM exercise class. Went over insurance, patient verbalized understanding.    Pensions consultant.

## 2021-11-25 LAB — BASIC METABOLIC PANEL
BUN/Creatinine Ratio: 19 (ref 9–20)
BUN: 18 mg/dL (ref 6–24)
CO2: 20 mmol/L (ref 20–29)
Calcium: 9.2 mg/dL (ref 8.7–10.2)
Chloride: 106 mmol/L (ref 96–106)
Creatinine, Ser: 0.94 mg/dL (ref 0.76–1.27)
Glucose: 100 mg/dL — ABNORMAL HIGH (ref 70–99)
Potassium: 4.7 mmol/L (ref 3.5–5.2)
Sodium: 142 mmol/L (ref 134–144)
eGFR: 94 mL/min/{1.73_m2} (ref >59)

## 2021-12-04 ENCOUNTER — Other Ambulatory Visit (HOSPITAL_COMMUNITY): Payer: Self-pay | Admitting: Internal Medicine

## 2021-12-06 ENCOUNTER — Other Ambulatory Visit: Payer: Self-pay

## 2021-12-06 ENCOUNTER — Ambulatory Visit (INDEPENDENT_AMBULATORY_CARE_PROVIDER_SITE_OTHER): Payer: Self-pay | Admitting: Surgery

## 2021-12-06 ENCOUNTER — Encounter: Payer: Self-pay | Admitting: Surgery

## 2021-12-06 VITALS — BP 99/63 | HR 85 | Resp 20 | Ht 69.0 in | Wt 198.0 lb

## 2021-12-06 DIAGNOSIS — Z951 Presence of aortocoronary bypass graft: Secondary | ICD-10-CM

## 2021-12-06 DIAGNOSIS — I2510 Atherosclerotic heart disease of native coronary artery without angina pectoris: Secondary | ICD-10-CM

## 2021-12-06 NOTE — Progress Notes (Signed)
° °  HPI: Patient returns for routine postoperative follow-up having undergone CABG x5 on 10/06/2021.  His preoperative ejection fraction was 30 to 35% with diffusely diseased diabetic vessels.  He was seen postoperatively by Dr. Haroldine Laws and has been followed up in the heart failure clinic. The patient's early postoperative recovery while in the hospital was notable for an uncomplicated postoperative course. Since hospital discharge the patient reports that he is feeling well.  He is walking without chest pain or shortness of breath.  He denies any peripheral edema or orthopnea.  He has been watching his diet and has lost weight.  His only complaint is that he is not sleeping well..   Current Outpatient Medications  Medication Sig Dispense Refill   aspirin 325 MG EC tablet Take 1 tablet (325 mg total) by mouth daily. 30 tablet 3   carvedilol (COREG) 3.125 MG tablet Take 1 tablet (3.125 mg total) by mouth 2 (two) times daily with a meal. 60 tablet 3   dapagliflozin propanediol (FARXIGA) 10 MG TABS tablet Take 1 tablet (10 mg total) by mouth daily before breakfast. 30 tablet 3   furosemide (LASIX) 20 MG tablet Take 1 tablet (20 mg total) by mouth daily. 30 tablet 3   gabapentin (NEURONTIN) 300 MG capsule Take 3 capsules (900 mg total) by mouth at bedtime. 90 capsule 0   metFORMIN (GLUCOPHAGE-XR) 750 MG 24 hr tablet Take 1 tablet (750 mg total) by mouth 2 (two) times daily. 60 tablet 3   OVER THE COUNTER MEDICATION Take 1 capsule by mouth at bedtime as needed (sleep). CBD gummies     pravastatin (PRAVACHOL) 40 MG tablet Take 1 tablet (40 mg total) by mouth daily at 6 PM. 30 tablet 3   sacubitril-valsartan (ENTRESTO) 24-26 MG Take 1 tablet by mouth 2 (two) times daily. 60 tablet 3   No current facility-administered medications for this visit.    Physical Exam: BP 99/63    Pulse 85    Resp 20    Ht 5\' 9"  (1.753 m)    Wt 198 lb (89.8 kg)    SpO2 97% Comment: RA   BMI 29.24 kg/m  He looks  well. Cardiac exam shows a regular rate and rhythm with normal heart sounds.  There is no murmur. Lungs are clear. The chest incision is healing well and the sternum is stable. His leg incision is healing well and there is no peripheral edema.  Diagnostic Tests:  None today  Impression:  Overall I think he is doing well 2 months following bypass surgery.  I encouraged him to continue walking as much as possible.  He is planning on starting the cardiac rehab program in the near future.  I asked him not to lift anything heavier than 10 pounds for 1 more month postoperatively.  I recommended that he try either Benadryl or melatonin for sleep but should avoid prescription sleeping pills.  I would expect his sleep to improve with time.  Plan:  He will continue to follow-up with cardiology and his PCP and will return to see me if he has any problems with his incisions.   Gaye Pollack, MD [facility] [phone]

## 2021-12-20 ENCOUNTER — Encounter: Payer: Self-pay | Admitting: *Deleted

## 2021-12-20 ENCOUNTER — Telehealth (HOSPITAL_COMMUNITY): Payer: Self-pay | Admitting: *Deleted

## 2021-12-20 NOTE — Telephone Encounter (Signed)
Spoke with Rosanne Ashing completed health history. Confirmed orientation appointment for tomorrow.Gladstone Lighter, RN,BSN ?12/20/2021 9:12 AM  ?

## 2021-12-21 ENCOUNTER — Other Ambulatory Visit: Payer: Self-pay

## 2021-12-21 ENCOUNTER — Telehealth (HOSPITAL_COMMUNITY): Payer: Self-pay

## 2021-12-21 ENCOUNTER — Encounter (HOSPITAL_COMMUNITY)
Admission: RE | Admit: 2021-12-21 | Discharge: 2021-12-21 | Disposition: A | Discharge status: Home / Self Care | Payer: BC Managed Care – PPO | Source: Ambulatory Visit | Attending: Internal Medicine | Admitting: Internal Medicine

## 2021-12-21 ENCOUNTER — Encounter (HOSPITAL_COMMUNITY): Payer: Self-pay

## 2021-12-21 VITALS — BP 114/62 | HR 83 | Ht 69.5 in | Wt 200.6 lb

## 2021-12-21 DIAGNOSIS — Z951 Presence of aortocoronary bypass graft: Secondary | ICD-10-CM | POA: Insufficient documentation

## 2021-12-21 NOTE — Progress Notes (Signed)
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 CBD Gummies, Disp: , Rfl:    carvedilol (COREG) 3.125 MG tablet, Take 1 tablet (3.125 mg total) by mouth 2 (two) times daily with a meal., Disp: 60 tablet, Rfl: 3   dapagliflozin propanediol (FARXIGA) 10 MG TABS tablet, Take 1 tablet (10 mg total) by mouth daily before breakfast., Disp: 30 tablet, Rfl: 3   furosemide (LASIX) 20 MG tablet, Take 1 tablet (20 mg total) by mouth daily. (Patient taking differently: Take 20 mg by mouth daily as needed (fluid retention (weight gain of 5 lbs within 3 days)).), Disp: 30 tablet, Rfl: 3   gabapentin (NEURONTIN) 300 MG capsule, Take 3 capsules (900 mg total) by mouth at bedtime., Disp: 90 capsule, Rfl: 0   metFORMIN (GLUCOPHAGE-XR) 750 MG 24 hr tablet, Take 1 tablet (750 mg total) by mouth 2 (two) times daily., Disp: 60 tablet, Rfl: 3   pravastatin (PRAVACHOL) 40 MG tablet, Take 1 tablet (40 mg total) by mouth daily at 6 PM. (Patient taking differently: Take 40 mg by mouth in the morning.), Disp: 30 tablet, Rfl: 3   sacubitril-valsartan (ENTRESTO) 24-26 MG, Take 1 tablet by mouth 2 (two) times daily., Disp: 60 tablet, Rfl: 3  Past Medical History: Past Medical History:  Diagnosis Date   Cardiomyopathy ([place])    CHF  (congestive heart failure) (H[facility])    Coronary artery disease    Diabetes mellitus without complication ([place])    Diabetic peripheral neuropathy ([facility])    Diabetic retinopathy ([place])    Diverticulitis    Dyspnea    Elevated LDL cholesterol level    Family history of adverse reaction to anesthesia    Patient states his dad "woke up crazy from anesthesia and had to be restrained"   Hypertension    Pharyngoesophageal dysphagia     Tobacco Use: Social History   Tobacco Use  Smoking Status Never  Smokeless Tobacco Never    Labs: Recent Review Flowsheet Data     Labs for ITP Cardiac and Pulmonary Rehab Latest Ref Rng & Units 10/07/2021 10/08/2021 10/09/2021 10/10/2021 11/10/2021   Cholestrol 0 - 200 mg/dL - - - - 170   LDLCALC 0 - 99 mg/dL - - - - 106(H)   HDL >40 mg/dL - - - - 47   Trlycerides <150 mg/dL - - - - 85   Hemoglobin A1c 4.8 - 5.6 % - - - - -   PHART 7.350 - 7.450 - - - - -   PCO2ART 32.0 - 48.0 mmHg - - - - -   HCO3 20.0 - 28.0 mmol/L - - - - -   TCO2 22 - 32 mmol/L - - - - -   ACIDBASEDEF 0.0 - 2.0 mmol/L - - - - -  O2SAT % 74.5 72.4 66.4 66.1 -       Capillary Blood Glucose: Lab Results  Component Value Date   GLUCAP 193 (H) 10/10/2021   GLUCAP 78 10/10/2021   GLUCAP 160 (H) 10/09/2021   GLUCAP 118 (H) 10/09/2021   GLUCAP 138 (H) 10/09/2021     Exercise Target Goals: Exercise Program Goal: Individual exercise prescription set using results from initial 6 min walk test and THRR while considering  patients activity barriers and safety.   Exercise Prescription Goal: Starting with aerobic activity 30 plus minutes a day, 3 days per week for initial exercise prescription. Provide home exercise prescription and guidelines that participant acknowledges understanding prior to discharge.  Activity Barriers & Risk Stratification:  Activity Barriers & Cardiac Risk Stratification - 12/21/21 0947       Activity Barriers & Cardiac Risk Stratification   Activity  Barriers Deconditioning;Incisional Pain;Balance Concerns;Other (comment)    Comments Neuropathy in both feet    Cardiac Risk Stratification High             6 Minute Walk:  6 Minute Walk     Row Name 12/21/21 0903         6 Minute Walk   Phase Initial     Distance 1448 feet     Walk Time 6 minutes     # of Rest Breaks 0     MPH 2.74     METS 3.56     RPE 10     Perceived Dyspnea  0     VO2 Peak 12.46     Symptoms Yes (comment)     Comments Post surgical chest soreness, pt denies pain     Resting HR 84 bpm     Resting BP 114/62     Resting Oxygen Saturation  99 %     Exercise Oxygen Saturation  during 6 min walk 99 %     Max Ex. HR 96 bpm     Max Ex. BP 110/68     2 Minute Post BP 106/70              Oxygen Initial Assessment:   Oxygen Re-Evaluation:   Oxygen Discharge (Final Oxygen Re-Evaluation):   Initial Exercise Prescription:  Initial Exercise Prescription - 12/21/21 0900       Date of Initial Exercise RX and Referring Provider   Date 12/21/21    Referring Provider Glori Bickers, MD    Expected Discharge Date 02/16/22      NuStep   Level 3    SPM 75    Minutes 15    METs 2.8      Arm Ergometer   Level 1.8    RPM 60    Minutes 15    METs 2.4      Prescription Details   Frequency (times per week) 3    Duration Progress to 30 minutes of continuous aerobic without signs/symptoms of physical distress      Intensity   THRR 40-80% of Max Heartrate 65-130    Ratings of Perceived Exertion 11-13    Perceived Dyspnea 0-4      Progression   Progression Continue progressive overload as per policy without signs/symptoms or physical distress.      Resistance Training   Training Prescription Yes    Weight 3 lbs    Reps 10-15             Perform Capillary Blood Glucose checks as needed.  Exercise Prescription Changes:  Exercise Comments:   Exercise Goals and Review:   Exercise Goals     Row Name 12/21/21 0948              Exercise Goals   Increase Physical Activity Yes       Intervention Provide advice, education, support and counseling about physical activity/exercise needs.;Develop an individualized exercise prescription for aerobic and resistive training based on initial evaluation findings, risk stratification, comorbidities and participant's personal goals.       Expected Outcomes Short Term: Attend rehab on a regular basis to increase amount of physical activity.;Long Term: Add in home exercise to make exercise part of routine and to increase amount of physical activity.;Long Term: Exercising regularly at least 3-5 days a week.       Increase Strength and Stamina Yes       Intervention Provide advice, education, support and counseling about physical activity/exercise needs.;Develop an individualized exercise prescription for aerobic and resistive training based on initial evaluation findings, risk stratification, comorbidities and participant's personal goals.       Expected Outcomes Short Term: Increase workloads from initial exercise prescription for resistance, speed, and METs.;Short Term: Perform resistance training exercises routinely during rehab and add in resistance training at home;Long Term: Improve cardiorespiratory fitness, muscular endurance and strength as measured by increased METs and functional capacity (6MWT)       Able to understand and use rate of perceived exertion (RPE) scale Yes       Intervention Provide education and explanation on how to use RPE scale       Expected Outcomes Short Term: Able to use RPE daily in rehab to express subjective intensity level;Long Term:  Able to use RPE to guide intensity level when exercising independently       Knowledge and understanding of Target Heart Rate Range (THRR) Yes       Intervention Provide education and explanation of THRR including how the numbers were predicted and where they are located for reference       Expected Outcomes Short Term:  Able to state/look up THRR;Short Term: Able to use daily as guideline for intensity in rehab;Long Term: Able to use THRR to govern intensity when exercising independently       Understanding of Exercise Prescription Yes       Intervention Provide education, explanation, and written materials on patient's individual exercise prescription       Expected Outcomes Short Term: Able to explain program exercise prescription;Long Term: Able to explain home exercise prescription to exercise independently                Exercise Goals Re-Evaluation :    Discharge Exercise Prescription (Final Exercise Prescription Changes):   Nutrition:  Target Goals: Understanding of nutrition guidelines, daily intake of sodium 1500mg , cholesterol 200mg , calories 30% from fat and 7% or less from saturated fats, daily to have 5 or more servings of fruits and vegetables.  Biometrics:  Pre Biometrics - 12/21/21 0830       Pre Biometrics   Waist Circumference 40.25 inches    Hip Circumference 41.5 inches    Waist to Hip Ratio 0.97 %    Triceps Skinfold 12 mm    % Body Fat 26.8 %    Grip Strength 36 kg    Flexibility 10.75 in    Single Leg Stand 9.25 seconds              Nutrition Therapy Plan and Nutrition Goals:   Nutrition Assessments:  MEDIFICTS Score Key: ?70 Need to make dietary changes  40-70 Heart Healthy Diet ? 40 Therapeutic Level Cholesterol Diet   Picture Your Plate Scores: D34-534 Unhealthy dietary pattern with much room for improvement. 41-50 Dietary pattern unlikely to meet recommendations for good health and room for improvement. 51-60 More healthful dietary pattern, with some room for improvement.  >60 Healthy dietary pattern, although there may be some specific behaviors that could be improved.    Nutrition Goals Re-Evaluation:   Nutrition Goals Discharge (Final Nutrition Goals Re-Evaluation):   Psychosocial: Target Goals: Acknowledge presence or absence of  significant depression and/or stress, maximize coping skills, provide positive support system. Participant is able to verbalize types and ability to use techniques and skills needed for reducing stress and depression.  Initial Review & Psychosocial Screening:  Initial Psych Review & Screening - 12/21/21 0852       Initial Review   Current issues with Current Stress Concerns    Source of Stress Concerns Financial    Comments JIm says he is concerned about finances as he has been out of work since surgery. Clair Gulling retrurns to work on June 13th      [place]? Yes   Clair Gulling lives alone. Clair Gulling has a sister who lives in [place] for support     Barriers   Psychosocial barriers to participate in program The patient should benefit from training in stress management and relaxation.      Screening Interventions   Interventions Encouraged to exercise    Expected Outcomes Long Term Goal: Stressors or current issues are controlled or eliminated.             Quality of Life Scores:  Quality of Life - 12/21/21 0938       Quality of Life   Select Quality of Life      Quality of Life Scores   Health/Function Pre 17.83 %    Socioeconomic Pre 17.14 %    Psych/Spiritual Pre 12.93 %    Family Pre 24.17 %    GLOBAL Pre 17.2 %            Scores of 19 and below usually indicate a poorer quality of life in these areas.  A difference of  2-3 points is a clinically meaningful difference.  A difference of 2-3 points in the total score of the Quality of Life Index has been associated with significant improvement in overall quality of life, self-image, physical symptoms, and general health in studies assessing change in quality of life.  PHQ-9: Recent Review Flowsheet Data     Depression screen [facility] 2/9 12/21/2021   Decreased Interest 0   Down, Depressed, Hopeless 0   PHQ - 2 Score 0      Interpretation of Total Score  Total Score Depression Severity:  1-4 = Minimal  depression, 5-9 = Mild depression, 10-14 = Moderate depression, 15-19 = Moderately severe depression, 20-27 = Severe depression   Psychosocial Evaluation and Intervention:   Psychosocial Re-Evaluation:   Psychosocial Discharge (Final Psychosocial Re-Evaluation):   Vocational Rehabilitation: Provide vocational rehab assistance to qualifying candidates.   Vocational Rehab Evaluation & Intervention:  Vocational Rehab - 12/21/21 1125       Initial Vocational Rehab Evaluation & Intervention   Assessment shows need for Vocational Rehabilitation No   Clair Gulling is returning to March 13th and does not need vocational rehab at this time            Education: Education  Goals: Education classes will be provided on a weekly basis, covering required topics. Participant will state understanding/return demonstration of topics presented.  Learning Barriers/Preferences:  Learning Barriers/Preferences - 12/21/21 [id number]       Learning Barriers/Preferences   Learning Barriers None    Learning Preferences Skilled Demonstration;Individual Instruction;Group Instruction             Education Topics: Hypertension, Hypertension Reduction -Define heart disease and high blood pressure. Discus how high blood pressure affects the body and ways to reduce high blood pressure.   Exercise and Your Heart -Discuss why it is important to exercise, the FITT principles of exercise, normal and abnormal responses to exercise, and how to exercise safely.   Angina -Discuss definition of angina, causes of angina, treatment of angina, and how to decrease risk of having angina.   Cardiac Medications -Review what the following cardiac medications are used for, how they affect the body, and side effects that may occur when taking the medications.  Medications include Aspirin, Beta blockers, calcium channel blockers, ACE Inhibitors, angiotensin receptor blockers, diuretics, digoxin, and  antihyperlipidemics.   Congestive Heart Failure -Discuss the definition of CHF, how to live with CHF, the signs and symptoms of CHF, and how keep track of weight and sodium intake.   Heart Disease and Intimacy -Discus the effect sexual activity has on the heart, how changes occur during intimacy as we age, and safety during sexual activity.   Smoking Cessation / COPD -Discuss different methods to quit smoking, the health benefits of quitting smoking, and the definition of COPD.   Nutrition I: Fats -Discuss the types of cholesterol, what cholesterol does to the heart, and how cholesterol levels can be controlled.   Nutrition II: Labels -Discuss the different components of food labels and how to read food label   Heart Parts/Heart Disease and PAD -Discuss the anatomy of the heart, the pathway of blood circulation through the heart, and these are affected by heart disease.   Stress I: Signs and Symptoms -Discuss the causes of stress, how stress may lead to anxiety and depression, and ways to limit stress.   Stress II: Relaxation -Discuss different types of relaxation techniques to limit stress.   Warning Signs of Stroke / TIA -Discuss definition of a stroke, what the signs and symptoms are of a stroke, and how to identify when someone is having stroke.   Knowledge Questionnaire Score:  Knowledge Questionnaire Score - 12/21/21 0941       Knowledge Questionnaire Score   Pre Score 19/24             Core Components/Risk Factors/Patient Goals at Admission:  Personal Goals and Risk Factors at Admission - 12/21/21 0940       Core Components/Risk Factors/Patient Goals on Admission    Weight Management Yes;Weight Maintenance    Intervention Weight Management: Develop a combined nutrition and exercise program designed to reach desired caloric intake, while maintaining appropriate intake of nutrient and fiber, sodium and fats, and appropriate energy expenditure required  for the weight goal.;Weight Management: Provide education and appropriate resources to help participant work on and attain dietary goals.    Diabetes Yes    Intervention Provide education about signs/symptoms and action to take for hypo/hyperglycemia.;Provide education about proper nutrition, including hydration, and aerobic/resistive exercise prescription along with prescribed medications to achieve blood glucose in normal ranges: Fasting glucose 65-99 mg/dL    Expected Outcomes Short Term: Participant verbalizes understanding of the signs/symptoms and immediate care of hyper/hypoglycemia, proper  foot care and importance of medication, aerobic/resistive exercise and nutrition plan for blood glucose control.;Long Term: Attainment of HbA1C < 7%.    Lipids Yes    Intervention Provide education and support for participant on nutrition & aerobic/resistive exercise along with prescribed medications to achieve LDL 70mg , HDL >40mg .    Expected Outcomes Short Term: Participant states understanding of desired cholesterol values and is compliant with medications prescribed. Participant is following exercise prescription and nutrition guidelines.;Long Term: Cholesterol controlled with medications as prescribed, with individualized exercise RX and with personalized nutrition plan. Value goals: LDL < 70mg , HDL > 40 mg.    Stress Yes    Intervention Offer individual and/or small group education and counseling on adjustment to heart disease, stress management and health-related lifestyle change. Teach and support self-help strategies.;Refer participants experiencing significant psychosocial distress to appropriate mental health specialists for further evaluation and treatment. When possible, include family members and significant others in education/counseling sessions.    Expected Outcomes Short Term: Participant demonstrates changes in health-related behavior, relaxation and other stress management skills, ability to  obtain effective social support, and compliance with psychotropic medications if prescribed.;Long Term: Emotional wellbeing is indicated by absence of clinically significant psychosocial distress or social isolation.             Core Components/Risk Factors/Patient Goals Review:    Core Components/Risk Factors/Patient Goals at Discharge (Final Review):    ITP Comments:  ITP Comments     Row Name 12/21/21 0851           ITP Comments Dr Fransico Him MD, Medical Director                Comments: Clair Gulling attended orientation on 12/21/2021 to review rules and guidelines for program.  Completed 6 minute walk test, Intitial ITP, and exercise prescription.  VSS. Telemetry-Sinus Rhythm.  Reported  some sternal soreness otherwise. Asymptomatic. Denies chest pain. Safety measures and social distancing in place per CDC guidelines. Clair Gulling will attend cardiac rehab twice a week due to co pay.Harrell Gave RN BSN

## 2021-12-21 NOTE — Telephone Encounter (Signed)
Per cardiac rehab nurse [facility], pt only would like to attend cardiac rehab on Mon and Fri. I canceled all of pt's Wed appts for cardiac rehab. ?

## 2021-12-21 NOTE — Progress Notes (Signed)
Cardiac Rehab Medication Review by a Nurse ? ?Does the patient  feel that his/her medications are working for him/her?  yes ? ?Has the patient been experiencing any side effects to the medications prescribed?  no ? ?Does the patient measure his/her own blood pressure or blood glucose at home?  yes  ? ?Does the patient have any problems obtaining medications due to transportation or finances?   no ? ?Understanding of regimen: good ?Understanding of indications: good ?Potential of compliance: good ? ? ? ?Nurse comments: Rosanne Ashing is taking his medications as prescribed and has a good understanding of what his medications are for. Rosanne Ashing checks his CBG's daily. Rosanne Ashing does  not have a BP cuff/ monitor. ? ? ? ?Thayer Headings RN ?12/21/2021 9:00 AM ?  ?

## 2021-12-22 NOTE — Telephone Encounter (Signed)
Pt called and left a message stated that he is having eye issues and that he wouldn't be able to make his cardiac rehab session on 3/6. I canceled his session for that day. ?

## 2021-12-25 ENCOUNTER — Encounter (HOSPITAL_COMMUNITY): Payer: BC Managed Care – PPO

## 2021-12-25 DIAGNOSIS — E113513 Type 2 diabetes mellitus with proliferative diabetic retinopathy with macular edema, bilateral: Secondary | ICD-10-CM | POA: Diagnosis not present

## 2021-12-25 DIAGNOSIS — H31093 Other chorioretinal scars, bilateral: Secondary | ICD-10-CM | POA: Diagnosis not present

## 2021-12-25 DIAGNOSIS — H3582 Retinal ischemia: Secondary | ICD-10-CM | POA: Diagnosis not present

## 2021-12-25 DIAGNOSIS — H35371 Puckering of macula, right eye: Secondary | ICD-10-CM | POA: Diagnosis not present

## 2021-12-27 ENCOUNTER — Encounter (HOSPITAL_COMMUNITY): Payer: BC Managed Care – PPO

## 2021-12-27 ENCOUNTER — Telehealth (HOSPITAL_COMMUNITY): Payer: Self-pay | Admitting: *Deleted

## 2021-12-27 NOTE — Telephone Encounter (Signed)
Spoke with Brett Walker he plans to begin exercise on Friday.Gladstone Lighter, RN,BSN ?12/27/2021 4:12 PM  ?

## 2021-12-29 ENCOUNTER — Other Ambulatory Visit: Payer: Self-pay

## 2021-12-29 ENCOUNTER — Encounter (HOSPITAL_COMMUNITY)
Admission: RE | Admit: 2021-12-29 | Discharge: 2021-12-29 | Disposition: A | Discharge status: Home / Self Care | Payer: BC Managed Care – PPO | Source: Ambulatory Visit | Attending: Internal Medicine | Admitting: Internal Medicine

## 2021-12-29 DIAGNOSIS — Z951 Presence of aortocoronary bypass graft: Secondary | ICD-10-CM

## 2021-12-29 LAB — GLUCOSE, CAPILLARY
Glucose-Capillary: 197 mg/dL — ABNORMAL HIGH (ref 70–99)
Glucose-Capillary: 192 mg/dL — ABNORMAL HIGH (ref 70–99)

## 2021-12-29 NOTE — Progress Notes (Signed)
Daily Session Note ? ?Patient Details  ?Name: Brett Walker ?MRN: [id number] ?Date of Birth: 1961/07/04 ?Referring Provider:   ?Flowsheet Row CARDIAC REHAB PHASE II ORIENTATION from 12/21/2021 in [place]  ?Referring Provider Glori Bickers, MD  ? ?  ? ? ?Encounter Date: 12/29/2021 ? ?Check In: ? Session Check In - 12/29/21 0831   ? ?  ? Check-In  ? Supervising physician immediately available to respond to emergencies [facility] immediately available   ? Physician(s) Dr. Alfredia Ferguson   ? Location [facility]   ? Staff Present Barnet Pall, RN, Milus Glazier, MS, ACSM-CEP, CCRP, Exercise Physiologist;Carlette Wilber Oliphant, RN, Deland Pretty, MS, ACSM CEP, Exercise Physiologist;Kaylee Rosana Hoes, MS, ACSM-CEP, Exercise Physiologist   ? Virtual Visit No   ? Medication changes reported     No   ? Fall or balance concerns reported    No   ? Tobacco Cessation No Change   ? Current number of cigarettes/nicotine per day     0   ? Warm-up and Cool-down Performed as group-led instruction   ? Resistance Training Performed Yes   ? VAD Patient? No   ? PAD/SET Patient? No   ?  ? Pain Assessment  ? Currently in Pain? Yes   ? Pain Score 4    ? Pain Location Shoulder   ? Pain Orientation Right;Left   bilateral shoulder pain  ? Multiple Pain Sites No   ? ?  ?  ? ?  ? ? ?Capillary Blood Glucose: ?Results for orders placed or performed during the hospital encounter of 12/29/21 (from the past 24 hour(s))  ?Glucose, capillary     Status: Abnormal  ? Collection Time: 12/29/21  9:12 AM  ?Result Value Ref Range  ? Glucose-Capillary 192 (H) 70 - 99 mg/dL  ? ? ? Exercise Prescription Changes - 12/29/21 0900   ? ?  ? Response to Exercise  ? Blood Pressure (Admit) 120/70   ? Blood Pressure (Exercise) 138/80   ? Blood Pressure (Exit) 111/68   ? Heart Rate (Admit) 81 bpm   ? Heart Rate (Exercise) 106 bpm   ? Heart Rate (Exit) 89 bpm   ? Rating of Perceived Exertion (Exercise) 13   ?  Symptoms Bilateral shoulder pain 3-4/10   ? Comments Pt's first day in the CRP2 program   ? Duration Continue with 30 min of aerobic exercise without signs/symptoms of physical distress.   ? Intensity THRR unchanged   ?  ? Progression  ? Progression Continue to progress workloads to maintain intensity without signs/symptoms of physical distress.   ? Average METs 1.9   ?  ? Resistance Training  ? Training Prescription Yes   ? Weight 3 lbs   ? Reps 10-15   ? Time 10 Minutes   ?  ? Interval Training  ? Interval Training No   ?  ? NuStep  ? Level 2   ? SPM 75   ? Minutes 15   ? METs 2   ?  ? Arm Ergometer  ? Level 1   had to decrease level due to shoulder pain  ? RPM 40   ? Minutes 15   ? METs 1.8   ? ?  ?  ? ?  ? ? ?Social History  ? ?Tobacco Use  ?Smoking Status Never  ?Smokeless Tobacco Never  ? ? ?Goals Met:  ?Exercise tolerated well ?No report of concerns or symptoms today ?Strength training completed today ? ?  Goals Unmet:  ?Not Applicable ? ?Comments: Brett Walker started cardiac rehab today.  Pt tolerated light exercise without difficulty. VSS, telemetry-Sinus Rhythm t wave inversion, asymptomatic.  Medication list reconciled. Pt denies barriers to medicaiton compliance.  PSYCHOSOCIAL ASSESSMENT:  PHQ-0. Pt exhibits positive coping skills, hopeful outlook with supportive family. No psychosocial needs identified at this time, no psychosocial interventions necessary.    Pt enjoys ball games and concerts.   Pt oriented to exercise equipment and routine.    Understanding verbalized.  ? ?QUALITY OF LIFE SCORE REVIEW ? Pt completed Quality of Life survey as a participant in Cardiac Rehab.  Scores 21.0 or below are considered low.  Pt score very low in several areas Overall 17.20, Health and Function 17.83, socioeconomic 17.14, physiological and spiritual 12.93, family 24.17. Patient quality of life slightly altered by physical constraints which limits ability to perform as prior to recent cardiac illness.Brett Walker reports being  dissatisfied with his health due to his recent open heart surgery. Brett Walker admits to having some sternal soreness.post surgery. Brett Walker denies being depressed and looks forward to returning to work on Monday.Brett Walker emotional support and reassurance.  Will continue to monitor and intervene as necessary.Barnet Pall, RN,BSN ?12/29/2021 12:02 PM    ? ? ?Dr. Fransico Him is Medical Director for Cardiac Rehab at [facility]. ?

## 2022-01-01 ENCOUNTER — Encounter (HOSPITAL_COMMUNITY)
Admission: RE | Admit: 2022-01-01 | Discharge: 2022-01-01 | Disposition: A | Discharge status: Home / Self Care | Payer: BC Managed Care – PPO | Source: Ambulatory Visit | Attending: Internal Medicine | Admitting: Internal Medicine

## 2022-01-01 ENCOUNTER — Other Ambulatory Visit: Payer: Self-pay

## 2022-01-01 DIAGNOSIS — Z951 Presence of aortocoronary bypass graft: Secondary | ICD-10-CM

## 2022-01-01 LAB — GLUCOSE, CAPILLARY
Glucose-Capillary: 154 mg/dL — ABNORMAL HIGH (ref 70–99)
Glucose-Capillary: 150 mg/dL — ABNORMAL HIGH (ref 70–99)

## 2022-01-03 ENCOUNTER — Encounter (HOSPITAL_COMMUNITY): Payer: BC Managed Care – PPO

## 2022-01-03 NOTE — Progress Notes (Signed)
Cardiac Individual Treatment Plan ? ?Patient Details  ?Name: Brett Walker ?MRN: [id number] ?Date of Birth: 03-29-1961 ?Referring Provider:   ?Flowsheet Row CARDIAC REHAB PHASE II ORIENTATION from 12/21/2021 in [facility]  ?Referring Provider Glori Bickers, MD  ? ?  ? ? ?Initial Encounter Date:  ?Flowsheet Row CARDIAC REHAB PHASE II ORIENTATION from 12/21/2021 in [facility]  ?Date 12/21/21  ? ?  ? ? 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 ?Patient's Home Medications on Admission: ? ?Current Outpatient Medications:  ?  aspirin 325 MG EC tablet, Take 1 tablet (325 mg total) by mouth daily., Disp: 30 tablet, Rfl: 3 ?  CANNABIDIOL PO, Take 1 tablet by mouth at bedtime as needed (sleep). CBD Gummies, Disp: , Rfl:  ?  carvedilol (COREG) 3.125 MG tablet, Take 1 tablet (3.125 mg total) by mouth 2 (two) times daily with a meal., Disp: 60 tablet, Rfl: 3 ?  dapagliflozin propanediol (FARXIGA) 10 MG TABS tablet, Take 1 tablet (10 mg total) by mouth daily before breakfast., Disp: 30 tablet, Rfl: 3 ?  furosemide (LASIX) 20 MG tablet, Take 1 tablet (20 mg total) by mouth daily. (Patient taking differently: Take 20 mg by mouth daily as needed (fluid retention (weight gain of 5 lbs within 3 days)).), Disp: 30 tablet, Rfl: 3 ?  gabapentin (NEURONTIN) 300 MG capsule, Take 3 capsules (900 mg total) by mouth at bedtime., Disp: 90 capsule, Rfl: 0 ?  metFORMIN (GLUCOPHAGE-XR) 750 MG 24 hr tablet, Take 1 tablet (750 mg total) by mouth 2 (two) times daily., Disp: 60 tablet, Rfl: 3 ?  pravastatin (PRAVACHOL) 40 MG tablet, Take 1 tablet (40 mg total) by mouth daily at 6 PM. (Patient taking differently: Take 40 mg by mouth in the morning.), Disp: 30 tablet, Rfl: 3 ?  sacubitril-valsartan (ENTRESTO) 24-26 MG, Take 1 tablet by mouth 2 (two) times daily., Disp: 60 tablet, Rfl: 3 ? ?Past Medical History: ?Past Medical History:  ?Diagnosis Date  ? Cardiomyopathy ([place])   ? CHF  (congestive heart failure) ([place])   ? Coronary artery disease   ? Diabetes mellitus without complication ([place])   ? Diabetic peripheral neuropathy ([place])   ? Diabetic retinopathy ([place])   ? Diverticulitis   ? Dyspnea   ? Elevated LDL cholesterol level   ? Family history of adverse reaction to anesthesia   ? Patient states his dad "woke up crazy from anesthesia and had to be restrained"  ? Hypertension   ? Pharyngoesophageal dysphagia   ? ? ?Tobacco Use: ?Social History  ? ?Tobacco Use  ?Smoking Status Never  ?Smokeless Tobacco Never  ? ? ?Labs: ?Recent Review Flowsheet Data   ? ? Labs for [facility]P Cardiac and Pulmonary Rehab Latest Ref Rng & Units 10/07/2021 10/08/2021 10/09/2021 10/10/2021 11/10/2021  ? Cholestrol 0 - 200 mg/dL - - - - 170  ? LDLCALC 0 - 99 mg/dL - - - - 106(H)  ? HDL >40 mg/dL - - - - 47  ? Trlycerides <150 mg/dL - - - - 85  ? Hemoglobin A1c 4.8 - 5.6 % - - - - -  ? PHART 7.350 - 7.450 - - - - -  ? PCO2ART 32.0 - 48.0 mmHg - - - - -  ? HCO3 20.0 - 28.0 mmol/L - - - - -  ? TCO2 22 - 32 mmol/L - - - - -  ? ACIDBASEDEF 0.0 - 2.0 mmol/L - - - - -  ?  O2SAT % 74.5 72.4 66.4 66.1 -  ? ?  ? ? ?Capillary Blood Glucose: ?Lab Results  ?Component Value Date  ? GLUCAP 150 (H) 01/01/2022  ? GLUCAP 154 (H) 01/01/2022  ? GLUCAP 192 (H) 12/29/2021  ? GLUCAP 197 (H) 12/29/2021  ? GLUCAP 193 (H) 10/10/2021  ? ? ? ?Exercise Target Goals: ?Exercise Program Goal: ?Individual exercise prescription set using results from initial 6 min walk test and THRR while considering  patient?s activity barriers and safety.  ? ?Exercise Prescription Goal: ?Starting with aerobic activity 30 plus minutes a day, 3 days per week for initial exercise prescription. Provide home exercise prescription and guidelines that participant acknowledges understanding prior to discharge. ? ?Activity Barriers & Risk Stratification: ? Activity Barriers & Cardiac Risk Stratification - 12/21/21 0947   ? ?  ? Activity Barriers & Cardiac Risk Stratification  ?  Activity Barriers Deconditioning;Incisional Pain;Balance Concerns;Other (comment)   ? Comments Neuropathy in both feet   ? Cardiac Risk Stratification High   ? ?  ?  ? ?  ? ? ?6 Minute Walk: ? 6 Minute Walk   ? ? [place] Name 12/21/21 [id number]  ?  ?  ?  ? 6 Minute Walk  ? Phase Initial    ? Distance 1448 feet    ? Walk Time 6 minutes    ? # of Rest Breaks 0    ? MPH 2.74    ? METS 3.56    ? RPE 10    ? Perceived Dyspnea  0    ? VO2 Peak 12.46    ? Symptoms Yes (comment)    ? Comments Post surgical chest soreness, pt denies pain    ? Resting HR 84 bpm    ? Resting BP 114/62    ? Resting Oxygen Saturation  99 %    ? Exercise Oxygen Saturation  during 6 min walk 99 %    ? Max Ex. HR 96 bpm    ? Max Ex. BP 110/68    ? 2 Minute Post BP 106/70    ? ?  ?  ? ?  ? ? ?Oxygen Initial Assessment: ? ? ?Oxygen Re-Evaluation: ? ? ?Oxygen Discharge (Final Oxygen Re-Evaluation): ? ? ?Initial Exercise Prescription: ? Initial Exercise Prescription - 12/21/21 0900   ? ?  ? Date of Initial Exercise RX and Referring Provider  ? Date 12/21/21   ? Referring Provider Glori Bickers, MD   ? Expected Discharge Date 02/16/22   ?  ? NuStep  ? Level 3   ? SPM 75   ? Minutes 15   ? METs 2.8   ?  ? Arm Ergometer  ? Level 1.8   ? RPM 60   ? Minutes 15   ? METs 2.4   ?  ? Prescription Details  ? Frequency (times per week) 3   ? Duration Progress to 30 minutes of continuous aerobic without signs/symptoms of physical distress   ?  ? Intensity  ? THRR 40-80% of Max Heartrate 65-130   ? Ratings of Perceived Exertion 11-13   ? Perceived Dyspnea 0-4   ?  ? Progression  ? Progression Continue progressive overload as per policy without signs/symptoms or physical distress.   ?  ? Resistance Training  ? Training Prescription Yes   ? Weight 3 lbs   ? Reps 10-15   ? ?  ?  ? ?  ? ? ?Perform Capillary Blood Glucose checks as needed. ? ?Exercise Prescription  Changes: ? ? Exercise Prescription Changes   ? ? [place] Name 12/29/21 0900  ?  ?  ?  ?  ?  ? Response to Exercise  ?  Blood Pressure (Admit) 120/70      ? Blood Pressure (Exercise) 138/80      ? Blood Pressure (Exit) 111/68      ? Heart Rate (Admit) 81 bpm      ? Heart Rate (Exercise) 106 bpm      ? Heart Rate (Exit) 89 bpm      ? Rating of Perceived Exertion (Exercise) 13      ? Symptoms Bilateral shoulder pain 3-4/10      ? Comments Pt's first day in the CRP2 program      ? Duration Continue with 30 min of aerobic exercise without signs/symptoms of physical distress.      ? Intensity THRR unchanged      ?  ? Progression  ? Progression Continue to progress workloads to maintain intensity without signs/symptoms of physical distress.      ? Average METs 1.9      ?  ? Resistance Training  ? Training Prescription Yes      ? Weight 3 lbs      ? Reps 10-15      ? Time 10 Minutes      ?  ? Interval Training  ? Interval Training No      ?  ? NuStep  ? Level 2      ? SPM 75      ? Minutes 15      ? METs 2      ?  ? Arm Ergometer  ? Level 1  had to decrease level due to shoulder pain      ? RPM 40      ? Minutes 15      ? METs 1.8      ? ?  ?  ? ?  ? ? ?Exercise Comments: ? ? Exercise Comments   ? ? [place] Name 12/29/21 1004  ?  ?  ?  ?  ? Exercise Comments Pt's first day in the CRP2 program. Pt had c/o bilateral shoulder pain on arm ergometer and nustep. Otherwise pt tolerated the session well.      ? ?  ?  ? ?  ? ? ?Exercise Goals and Review: ? ? Exercise Goals   ? ? [place] Name 12/21/21 [id number]  ?  ?  ?  ?  ?  ? Exercise Goals  ? Increase Physical Activity Yes      ? Intervention Provide advice, education, support and counseling about physical activity/exercise needs.;Develop an individualized exercise prescription for aerobic and resistive training based on initial evaluation findings, risk stratification, comorbidities and participant's personal goals.      ? Expected Outcomes Short Term: Attend rehab on a regular basis to increase amount of physical activity.;Long Term: Add in home exercise to make exercise part of routine and to increase  amount of physical activity.;Long Term: Exercising regularly at least 3-5 days a week.      ? Increase Strength and Stamina Yes      ? Intervention Provide advice, education, support and counseling about physica

## 2022-01-05 ENCOUNTER — Other Ambulatory Visit: Payer: Self-pay

## 2022-01-05 ENCOUNTER — Encounter (HOSPITAL_COMMUNITY)
Admission: RE | Admit: 2022-01-05 | Discharge: 2022-01-05 | Disposition: A | Discharge status: Home / Self Care | Payer: BC Managed Care – PPO | Source: Ambulatory Visit | Attending: Internal Medicine | Admitting: Internal Medicine

## 2022-01-05 DIAGNOSIS — Z951 Presence of aortocoronary bypass graft: Secondary | ICD-10-CM | POA: Diagnosis not present

## 2022-01-07 NOTE — Progress Notes (Signed)
? ?ADVANCED HF CLINIC NOTE ? ? ?Primary Care: Tally JoeSwayne, David, MD ?CT Surgeon: Dr. Laneta SimmersBartle ?HF Cardiologist: Dr. Gala RomneyBensimhon ? ?HPI: ?Brett Walker is a 61 y.o.male w/ HTN, HLD, T2DM, CAD s/p CABG 12/22 and systolic heart failure due to iCM.  ? ?Admitted 12/22 for CP. Echo EF 30-35%, GIIDD, RV normal. R/LHC showed severe 3VCAD Underwent CABG x 5 12/22(LIMA-LAD, SVG-Diag, sequential SVG-OM/OM2 and SVG-PDA).  ? ?Today he returns for HF follow up. Has been going to CR. Overall feeling fine. Denies CP, sob, orthopnea or PND. Appetite ok. No fever or chills. Weight at home stable. Started cardiac reahb last week. Taking all medications. Started back working full time.  ? ?Echo today 01/08/22 EF 40-45% Akinesis of inferior, inferolateral and inferoseptal walls. G1DD, RV mild HK ? ?Cardiac Studies ?- Echo (12/22): EF 30-35%, global HK, RV normal, small pericardial effusion. ? ?- R/LHC (12/22): ?  Ost RCA to Prox RCA lesion is 90% stenosed. ?  Dist RCA lesion is 99% stenosed. ?  RPDA lesion is 100% stenosed. ?  RPAV lesion is 100% stenosed. ?  Ost Cx to Prox Cx lesion is 80% stenosed. ?  1st Mrg lesion is 95% stenosed. ?  2nd Mrg lesion is 95% stenosed. ?  Prox LAD to Mid LAD lesion is 80% stenosed. ?  1st Diag lesion is 100% stenosed. ?  2nd Diag lesion is 99% stenosed. ?  Mid LAD to Dist LAD lesion is 75% stenosed. ?  The left ventricular ejection fraction is 25-35% by visual estimate. ?Findings: ?  ?Ao = 101/71 (88) ?LV = 102/5 ?RA =  2 ?RV = 27/5 ?PA = 30/12 (19) ?PCW = 2 ?Fick cardiac output/index = 7.3/3.5 ?PVR = 2.2 WU ?SVR = 941 ?Ao sat = 95% ?PA sat = 72%, 73% ?SVC sat = 75% ?Assessment: ?1. Severe 3v CAD ?2. Low filling pressures with normal cardiac output ?3. iCM EF ~25% ?4. There was difficulty passing swan across pulmonary valve but no significant pulmonic stenosis (may be just angle of PA outflow tract) ? ?ROS: All systems reviewed and negative except as per HPI.  ? ?Past Medical History:  ?Diagnosis Date  ?  Cardiomyopathy ([facility])   ? CHF (congestive heart failure) ([facility])   ? Coronary artery disease   ? Diabetes mellitus without complication ([facility])   ? Diabetic peripheral neuropathy ([facility])   ? Diabetic retinopathy ([facility])   ? Diverticulitis   ? Dyspnea   ? Elevated LDL cholesterol level   ? Family history of adverse reaction to anesthesia   ? Patient states his dad "woke up crazy from anesthesia and had to be restrained"  ? Hypertension   ? Pharyngoesophageal dysphagia   ? ?Current Outpatient Medications  ?Medication Sig Dispense Refill  ? aspirin 325 MG EC tablet Take 1 tablet (325 mg total) by mouth daily. 30 tablet 3  ? CANNABIDIOL PO Take 1 tablet by mouth at bedtime as needed (sleep). CBD Gummies    ? carvedilol (COREG) 3.125 MG tablet Take 1 tablet (3.125 mg total) by mouth 2 (two) times daily with a meal. 60 tablet 3  ? dapagliflozin propanediol (FARXIGA) 10 MG TABS tablet Take 1 tablet (10 mg total) by mouth daily before breakfast. 30 tablet 3  ? furosemide (LASIX) 20 MG tablet Take 20 mg by mouth as needed.    ? gabapentin (NEURONTIN) 300 MG capsule Take 3 capsules (900 mg total) by mouth at bedtime. 90 capsule 0  ? metFORMIN (GLUCOPHAGE-XR) 750 MG 24 hr tablet  Take 1 tablet (750 mg total) by mouth 2 (two) times daily. 60 tablet 3  ? pravastatin (PRAVACHOL) 40 MG tablet Take 1 tablet (40 mg total) by mouth daily at 6 PM. 30 tablet 3  ? sacubitril-valsartan (ENTRESTO) 24-26 MG Take 1 tablet by mouth 2 (two) times daily. 60 tablet 3  ? ?No current facility-administered medications for this encounter.  ? ?Facility-Administered Medications Ordered in Other Encounters  ?Medication Dose Route Frequency Provider Last Rate Last Admin  ? perflutren lipid microspheres (DEFINITY) IV suspension  1-10 mL Intravenous PRN Jacklynn Ganong, FNP   2 mL at 01/08/22 1150  ? ?Allergies  ?Allergen Reactions  ? Penicillin G Hives  ? Lipitor [Atorvastatin Calcium] Diarrhea  ? Other Other (See Comments)  ? Ozempic (0.25 Or 0.5 Mg-Dose)  [Semaglutide(0.25 Or 0.5mg -Dos)] Diarrhea  ? Rosuvastatin Other (See Comments)  ?  GI cramping  ? ?Social History  ? ?Socioeconomic History  ? Marital status: Single  ?  Spouse name: Not on file  ? Number of children: 0  ? Years of education: 54  ? Highest education level: Not on file  ?Occupational History  ? Occupation: [employer]  ?Tobacco Use  ? Smoking status: Never  ? Smokeless tobacco: Never  ?Vaping Use  ? Vaping Use: Never used  ?Substance and Sexual Activity  ? Alcohol use: Not Currently  ?  Comment: h/o heavy use  ? Drug use: Never  ? Sexual activity: Not on file  ?Other Topics Concern  ? Not on file  ?Social History Narrative  ? Works at [employer]   ? Single  ? Right handed  ? First floor apartment  ? ?Social Determinants of Health  ? ?Financial Resource Strain: Not on file  ?Food Insecurity: Not on file  ?Transportation Needs: Not on file  ?Physical Activity: Not on file  ?Stress: Not on file  ?Social Connections: Not on file  ?Intimate Partner Violence: Not on file  ? ?Family History  ?Problem Relation Age of Onset  ? Parkinson's disease Father   ? CAD Father   ? Hydrocephalus Brother   ? CAD Paternal Grandfather   ? ?BP (!) 150/80   Pulse 81   Wt 91.7 kg (202 lb 3.2 oz)   SpO2 99%   BMI 29.43 kg/m?  ? ?Wt Readings from Last 3 Encounters:  ?01/08/22 91.7 kg (202 lb 3.2 oz)  ?12/21/21 91 kg (200 lb 9.9 oz)  ?12/06/21 89.8 kg (198 lb)  ? ?PHYSICAL EXAM: ?General:  Well appearing. No resp difficulty ?HEENT: normal ?Neck: supple. no JVD. Carotids 2+ bilat; no bruits. No lymphadenopathy or thryomegaly appreciated. ?Cor: PMI nondisplaced. Well healed  Regular rate & rhythm. No rubs, gallops or murmurs. ?Lungs: clear ?Abdomen: soft, nontender, nondistended. No hepatosplenomegaly. No bruits or masses. Good bowel sounds. ?Extremities: no cyanosis, clubbing, rash, edema ?Neuro: alert & orientedx3, cranial nerves grossly intact. moves all 4 extremities w/o difficulty. Affect pleasant ? ? ?ASSESSMENT & PLAN:  ?1.  Chronic Systolic Heart Failure ?- Ischemic CM 2/2 3V CAD ?- Echo 12/22: EF 30-35%, RV normal  ?- RHC 12/22: w/ Fick cardiac output/index = 7.3/3.5 ?- now s/p CABG 12/22 ?- Echo today 01/08/22 EF 40-45% Akinesis of inferior, inferolateral and inferoseptal walls. G1DD, RV mild HK ?- NYHA II. Volume ok  ?- Continue Farxiga 10 mg daily. ?- Continue Lasix 20 mg PRN ?- Continue spironolactone 12.5 mg daily. ?- Continue Entresto 24/26 mg bid. ?- Continue Coreg 3.125 mg bid. ? ?2. Multivessel CAD ?- s/p  CABG x 5 in 12/22 (LIMA-LAD, SVG-Diag, sequential SVG-OM/OM2 and SVG-PDA)  ?- Continue ASA/pravastatin 40 mg daily, GI upset with other statins. ?- Check Lipids today, low threshold for adding Repatha.   ?- No s/s angina ?- Continue CR ? ?3. Type 2DM  ?- Not currently on insulin. ?- A1c 6.6 (12/22) ?- On SGLT2i. Consider GLP1RA ? ?4. Snoring ?- Not interested in sleep study ? ?Arvilla Meres, MD  ?1:11 PM ? ? ?

## 2022-01-08 ENCOUNTER — Encounter (HOSPITAL_COMMUNITY): Payer: Self-pay | Admitting: Internal Medicine

## 2022-01-08 ENCOUNTER — Other Ambulatory Visit: Payer: Self-pay

## 2022-01-08 ENCOUNTER — Ambulatory Visit (HOSPITAL_BASED_OUTPATIENT_CLINIC_OR_DEPARTMENT_OTHER)
Admission: RE | Admit: 2022-01-08 | Discharge: 2022-01-08 | Disposition: A | Discharge status: Home / Self Care | Payer: BC Managed Care – PPO | Source: Ambulatory Visit

## 2022-01-08 ENCOUNTER — Encounter (HOSPITAL_COMMUNITY)
Admission: RE | Admit: 2022-01-08 | Discharge: 2022-01-08 | Disposition: A | Discharge status: Home / Self Care | Payer: BC Managed Care – PPO | Source: Ambulatory Visit | Attending: Internal Medicine | Admitting: Internal Medicine

## 2022-01-08 ENCOUNTER — Telehealth (HOSPITAL_COMMUNITY): Payer: Self-pay | Admitting: Surgery

## 2022-01-08 ENCOUNTER — Ambulatory Visit (HOSPITAL_COMMUNITY)
Admission: RE | Admit: 2022-01-08 | Discharge: 2022-01-08 | Disposition: A | Discharge status: Home / Self Care | Payer: BC Managed Care – PPO | Source: Ambulatory Visit | Attending: Internal Medicine | Admitting: Internal Medicine

## 2022-01-08 VITALS — BP 150/80 | HR 81 | Wt 202.2 lb

## 2022-01-08 DIAGNOSIS — Z79899 Other long term (current) drug therapy: Secondary | ICD-10-CM | POA: Insufficient documentation

## 2022-01-08 DIAGNOSIS — Z951 Presence of aortocoronary bypass graft: Secondary | ICD-10-CM

## 2022-01-08 DIAGNOSIS — Z7984 Long term (current) use of oral hypoglycemic drugs: Secondary | ICD-10-CM | POA: Diagnosis not present

## 2022-01-08 DIAGNOSIS — I5022 Chronic systolic (congestive) heart failure: Secondary | ICD-10-CM | POA: Diagnosis not present

## 2022-01-08 DIAGNOSIS — E119 Type 2 diabetes mellitus without complications: Secondary | ICD-10-CM | POA: Insufficient documentation

## 2022-01-08 DIAGNOSIS — I509 Heart failure, unspecified: Secondary | ICD-10-CM | POA: Diagnosis not present

## 2022-01-08 DIAGNOSIS — I2510 Atherosclerotic heart disease of native coronary artery without angina pectoris: Secondary | ICD-10-CM | POA: Diagnosis not present

## 2022-01-08 DIAGNOSIS — R0683 Snoring: Secondary | ICD-10-CM | POA: Diagnosis not present

## 2022-01-08 DIAGNOSIS — I255 Ischemic cardiomyopathy: Secondary | ICD-10-CM | POA: Insufficient documentation

## 2022-01-08 DIAGNOSIS — E1159 Type 2 diabetes mellitus with other circulatory complications: Secondary | ICD-10-CM | POA: Diagnosis not present

## 2022-01-08 DIAGNOSIS — Z7982 Long term (current) use of aspirin: Secondary | ICD-10-CM | POA: Diagnosis not present

## 2022-01-08 DIAGNOSIS — E785 Hyperlipidemia, unspecified: Secondary | ICD-10-CM

## 2022-01-08 DIAGNOSIS — I110 Hypertensive heart disease with heart failure: Secondary | ICD-10-CM | POA: Diagnosis not present

## 2022-01-08 LAB — LIPID PANEL
Cholesterol: 182 mg/dL (ref 0–200)
HDL: 63 mg/dL (ref >40)
LDL Cholesterol: 103 mg/dL — ABNORMAL HIGH (ref 0–99)
Total CHOL/HDL Ratio: 2.9 RATIO
Triglycerides: 78 mg/dL (ref <150)
VLDL: 16 mg/dL (ref 0–40)

## 2022-01-08 LAB — ECHOCARDIOGRAM COMPLETE
AR max vel: 2.76 cm2
AV Peak grad: 5.3 mmHg
Ao pk vel: 1.15 m/s
Area-P 1/2: 3.06 cm2
S' Lateral: 3.6 cm

## 2022-01-08 MED ORDER — PERFLUTREN LIPID MICROSPHERE
1.0000 mL | INTRAVENOUS | Status: DC | PRN
  Administered 2022-01-08: 2 mL via INTRAVENOUS
  Filled 2022-01-08: qty 10

## 2022-01-08 NOTE — Telephone Encounter (Signed)
Patient contacted about labwork and recommendations per Dr. Gala Romney.  He is agreeable to referral to Lipid clinic and I have placed order in [facility]. ?

## 2022-01-08 NOTE — Telephone Encounter (Signed)
-----   Message from Dolores Patty, MD sent at 01/08/2022  3:14 PM EDT ----- ?Please refer to lipid clinic to consider PCSK9 injections. Thank you ? ?

## 2022-01-08 NOTE — Patient Instructions (Signed)
Good to see you today! ? ?No medication changes ? ?Lab work done today we will call you with any abnormal results ? ?Your physician recommends that you schedule a follow-up appointment in: call office to schedule  appointment for June appointment ? ?If you have any questions or concerns before your next appointment please send [place] a message through [place] or call our office at [phone].   ? ?TO LEAVE A MESSAGE FOR THE NURSE SELECT OPTION 2, PLEASE LEAVE A MESSAGE INCLUDING: ?YOUR NAME ?DATE OF BIRTH ?CALL BACK NUMBER ?REASON FOR CALL**this is important as we prioritize the call backs ? ?YOU WILL RECEIVE A CALL BACK THE SAME DAY AS LONG AS YOU CALL BEFORE 4:00 PM ? ?At the [facility], you and your health needs are our priority. As part of our continuing mission to provide you with exceptional heart care, we have created designated Provider Care Teams. These Care Teams include your primary Cardiologist (physician) and Advanced Practice Providers (APPs- Physician Assistants and Nurse Practitioners) who all work together to provide you with the care you need, when you need it.  ? ?You may see any of the following providers on your designated Care Team at your next follow up: ?Dr Arvilla Meres ?Dr Marca Ancona ?Tonye Becket, NP ?Robbie Lis, PA ?Jessica Milford,NP ?Anna Genre, PA ?Karle Plumber, PharmD ? ? ?Please be sure to bring in all your medications bottles to every appointment.  ? ? ? ?

## 2022-01-09 DIAGNOSIS — E113511 Type 2 diabetes mellitus with proliferative diabetic retinopathy with macular edema, right eye: Secondary | ICD-10-CM | POA: Diagnosis not present

## 2022-01-10 ENCOUNTER — Encounter (HOSPITAL_COMMUNITY): Payer: BC Managed Care – PPO

## 2022-01-12 ENCOUNTER — Other Ambulatory Visit: Payer: Self-pay

## 2022-01-12 ENCOUNTER — Encounter (HOSPITAL_COMMUNITY)
Admission: RE | Admit: 2022-01-12 | Discharge: 2022-01-12 | Disposition: A | Discharge status: Home / Self Care | Payer: BC Managed Care – PPO | Source: Ambulatory Visit | Attending: Internal Medicine | Admitting: Internal Medicine

## 2022-01-12 DIAGNOSIS — Z951 Presence of aortocoronary bypass graft: Secondary | ICD-10-CM

## 2022-01-15 ENCOUNTER — Encounter (HOSPITAL_COMMUNITY)
Admission: RE | Admit: 2022-01-15 | Discharge: 2022-01-15 | Disposition: A | Discharge status: Home / Self Care | Payer: BC Managed Care – PPO | Source: Ambulatory Visit | Attending: Internal Medicine | Admitting: Internal Medicine

## 2022-01-15 ENCOUNTER — Other Ambulatory Visit: Payer: Self-pay

## 2022-01-15 DIAGNOSIS — Z951 Presence of aortocoronary bypass graft: Secondary | ICD-10-CM

## 2022-01-16 DIAGNOSIS — E113512 Type 2 diabetes mellitus with proliferative diabetic retinopathy with macular edema, left eye: Secondary | ICD-10-CM | POA: Diagnosis not present

## 2022-01-17 ENCOUNTER — Encounter (HOSPITAL_COMMUNITY): Payer: BC Managed Care – PPO

## 2022-01-19 ENCOUNTER — Encounter (HOSPITAL_COMMUNITY)
Admission: RE | Admit: 2022-01-19 | Discharge: 2022-01-19 | Disposition: A | Discharge status: Home / Self Care | Payer: BC Managed Care – PPO | Source: Ambulatory Visit | Attending: Internal Medicine | Admitting: Internal Medicine

## 2022-01-19 DIAGNOSIS — Z951 Presence of aortocoronary bypass graft: Secondary | ICD-10-CM

## 2022-01-22 ENCOUNTER — Encounter (HOSPITAL_COMMUNITY)
Admission: RE | Admit: 2022-01-22 | Discharge: 2022-01-22 | Disposition: A | Discharge status: Home / Self Care | Payer: BC Managed Care – PPO | Source: Ambulatory Visit | Attending: Internal Medicine | Admitting: Internal Medicine

## 2022-01-22 DIAGNOSIS — Z951 Presence of aortocoronary bypass graft: Secondary | ICD-10-CM | POA: Insufficient documentation

## 2022-01-22 DIAGNOSIS — E11311 Type 2 diabetes mellitus with unspecified diabetic retinopathy with macular edema: Secondary | ICD-10-CM | POA: Diagnosis not present

## 2022-01-22 DIAGNOSIS — Z48812 Encounter for surgical aftercare following surgery on the circulatory system: Secondary | ICD-10-CM | POA: Diagnosis not present

## 2022-01-22 DIAGNOSIS — H4312 Vitreous hemorrhage, left eye: Secondary | ICD-10-CM | POA: Diagnosis not present

## 2022-01-22 DIAGNOSIS — H2513 Age-related nuclear cataract, bilateral: Secondary | ICD-10-CM | POA: Diagnosis not present

## 2022-01-22 DIAGNOSIS — E113513 Type 2 diabetes mellitus with proliferative diabetic retinopathy with macular edema, bilateral: Secondary | ICD-10-CM | POA: Diagnosis not present

## 2022-01-24 ENCOUNTER — Encounter (HOSPITAL_COMMUNITY): Payer: BC Managed Care – PPO

## 2022-01-26 ENCOUNTER — Encounter (HOSPITAL_COMMUNITY)
Admission: RE | Admit: 2022-01-26 | Discharge: 2022-01-26 | Disposition: A | Discharge status: Home / Self Care | Payer: BC Managed Care – PPO | Source: Ambulatory Visit | Attending: Internal Medicine | Admitting: Internal Medicine

## 2022-01-26 DIAGNOSIS — Z48812 Encounter for surgical aftercare following surgery on the circulatory system: Secondary | ICD-10-CM | POA: Diagnosis not present

## 2022-01-26 DIAGNOSIS — Z951 Presence of aortocoronary bypass graft: Secondary | ICD-10-CM | POA: Diagnosis not present

## 2022-01-26 NOTE — Progress Notes (Signed)
Reviewed home exercise Rx with patient today.  Encouraged warm-up, cool-down, and stretching. Reviewed THRR of 65-130 and keeping RPE between 11-13. Encouraged to hydrate with activity.  ?Reviewed weather parameters for temperature and humidity for safe exercise outdoors. Reviewed S/S to terminate exercise and when to call 911 vs MD. Reviewed the use of NTG and pt was encouraged to carry at all times. Pt encouraged to always carry a cell phone for safety when exercising outdoors. PT encouraged to know his blood glucose level before beginning exercise. Pt verbalized understanding of the home exercise Rx and was provided a copy.  ? ? ?Lorin Picket MS, ACSM-CEP, CCRP  ?

## 2022-01-29 ENCOUNTER — Encounter (HOSPITAL_COMMUNITY)
Admission: RE | Admit: 2022-01-29 | Discharge: 2022-01-29 | Disposition: A | Discharge status: Home / Self Care | Payer: BC Managed Care – PPO | Source: Ambulatory Visit | Attending: Internal Medicine | Admitting: Internal Medicine

## 2022-01-29 DIAGNOSIS — Z48812 Encounter for surgical aftercare following surgery on the circulatory system: Secondary | ICD-10-CM | POA: Diagnosis not present

## 2022-01-29 DIAGNOSIS — Z951 Presence of aortocoronary bypass graft: Secondary | ICD-10-CM

## 2022-01-31 ENCOUNTER — Encounter (HOSPITAL_COMMUNITY): Payer: BC Managed Care – PPO

## 2022-01-31 NOTE — Progress Notes (Signed)
Cardiac Individual Treatment Plan ? ?Patient Details  ?Name: Brett Walker ?MRN: [id number] ?Date of Birth: 21-Oct-1961 ?Referring Provider:   ?Flowsheet Row CARDIAC REHAB PHASE II ORIENTATION from 12/21/2021 in [facility] CARDIAC REHAB  ?Referring Provider Arvilla Meres, MD  ? ?  ? ? ?Initial Encounter Date:  ?Flowsheet Row CARDIAC REHAB PHASE II ORIENTATION from 12/21/2021 in [facility] CARDIAC REHAB  ?Date 12/21/21  ? ?  ? ? 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 ?Patient's Home Medications on Admission: ? ?Current Outpatient Medications:  ?  aspirin 325 MG EC tablet, Take 1 tablet (325 mg total) by mouth daily., Disp: 30 tablet, Rfl: 3 ?  CANNABIDIOL PO, Take 1 tablet by mouth at bedtime as needed (sleep). CBD Gummies, Disp: , Rfl:  ?  carvedilol (COREG) 3.125 MG tablet, Take 1 tablet (3.125 mg total) by mouth 2 (two) times daily with a meal., Disp: 60 tablet, Rfl: 3 ?  dapagliflozin propanediol (FARXIGA) 10 MG TABS tablet, Take 1 tablet (10 mg total) by mouth daily before breakfast., Disp: 30 tablet, Rfl: 3 ?  furosemide (LASIX) 20 MG tablet, Take 20 mg by mouth as needed., Disp: , Rfl:  ?  gabapentin (NEURONTIN) 300 MG capsule, Take 3 capsules (900 mg total) by mouth at bedtime., Disp: 90 capsule, Rfl: 0 ?  metFORMIN (GLUCOPHAGE-XR) 750 MG 24 hr tablet, Take 1 tablet (750 mg total) by mouth 2 (two) times daily., Disp: 60 tablet, Rfl: 3 ?  pravastatin (PRAVACHOL) 40 MG tablet, Take 1 tablet (40 mg total) by mouth daily at 6 PM., Disp: 30 tablet, Rfl: 3 ?  sacubitril-valsartan (ENTRESTO) 24-26 MG, Take 1 tablet by mouth 2 (two) times daily., Disp: 60 tablet, Rfl: 3 ? ?Past Medical History: ?Past Medical History:  ?Diagnosis Date  ? Cardiomyopathy (HCC)   ? CHF (congestive heart failure) (HCC)   ? Coronary artery disease   ? Diabetes mellitus without complication (HCC)   ? Diabetic peripheral neuropathy (HCC)   ? Diabetic retinopathy (HCC)   ? Diverticulitis   ? Dyspnea    ? Elevated LDL cholesterol level   ? Family history of adverse reaction to anesthesia   ? Patient states his dad "woke up crazy from anesthesia and had to be restrained"  ? Hypertension   ? Pharyngoesophageal dysphagia   ? ? ?Tobacco Use: ?Social History  ? ?Tobacco Use  ?Smoking Status Never  ?Smokeless Tobacco Never  ? ? ?Labs: ?Review Flowsheet   ? ?  ?  Latest Ref Rng & Units 10/08/2021 10/09/2021 10/10/2021 11/10/2021  ?Labs for ITP Cardiac and Pulmonary Rehab  ?Cholestrol 0 - 200 mg/dL    527    ?LDL (calc) 0 - 99 mg/dL    782    ?HDL-C >40 mg/dL    47    ?Trlycerides <150 mg/dL    85    ?O2 Saturation % 72.4   66.4   66.1     ? ?  01/08/2022  ?Labs for ITP Cardiac and Pulmonary Rehab  ?Cholestrol 182    ?LDL (calc) 103    ?HDL-C 63    ?Trlycerides 78    ?O2 Saturation   ?  ? ? Multiple values from one day are sorted in reverse-chronological order  ?  ?  ? ? ?Capillary Blood Glucose: ?Lab Results  ?Component Value Date  ? GLUCAP 150 (H) 01/01/2022  ? GLUCAP 154 (H) 01/01/2022  ? GLUCAP 192 (H) 12/29/2021  ? GLUCAP  197 (H) 12/29/2021  ? GLUCAP 193 (H) 10/10/2021  ? ? ? ?Exercise Target Goals: ?Exercise Program Goal: ?Individual exercise prescription set using results from initial 6 min walk test and THRR while considering  patient?s activity barriers and safety.  ? ?Exercise Prescription Goal: ?Starting with aerobic activity 30 plus minutes a day, 3 days per week for initial exercise prescription. Provide home exercise prescription and guidelines that participant acknowledges understanding prior to discharge. ? ?Activity Barriers & Risk Stratification: ? Activity Barriers & Cardiac Risk Stratification - 12/21/21 0947   ? ?  ? Activity Barriers & Cardiac Risk Stratification  ? Activity Barriers Deconditioning;Incisional Pain;Balance Concerns;Other (comment)   ? Comments Neuropathy in both feet   ? Cardiac Risk Stratification High   ? ?  ?  ? ?  ? ? ?6 Minute Walk: ? 6 Minute Walk   ? ? Row Name 12/21/21 [id number]  ?   ?  ?  ? 6 Minute Walk  ? Phase Initial    ? Distance 1448 feet    ? Walk Time 6 minutes    ? # of Rest Breaks 0    ? MPH 2.74    ? METS 3.56    ? RPE 10    ? Perceived Dyspnea  0    ? VO2 Peak 12.46    ? Symptoms Yes (comment)    ? Comments Post surgical chest soreness, pt denies pain    ? Resting HR 84 bpm    ? Resting BP 114/62    ? Resting Oxygen Saturation  99 %    ? Exercise Oxygen Saturation  during 6 min walk 99 %    ? Max Ex. HR 96 bpm    ? Max Ex. BP 110/68    ? 2 Minute Post BP 106/70    ? ?  ?  ? ?  ? ? ?Oxygen Initial Assessment: ? ? ?Oxygen Re-Evaluation: ? ? ?Oxygen Discharge (Final Oxygen Re-Evaluation): ? ? ?Initial Exercise Prescription: ? Initial Exercise Prescription - 12/21/21 0900   ? ?  ? Date of Initial Exercise RX and Referring Provider  ? Date 12/21/21   ? Referring Provider Arvilla Meresaniel Bensimhon, MD   ? Expected Discharge Date 02/16/22   ?  ? NuStep  ? Level 3   ? SPM 75   ? Minutes 15   ? METs 2.8   ?  ? Arm Ergometer  ? Level 1.8   ? RPM 60   ? Minutes 15   ? METs 2.4   ?  ? Prescription Details  ? Frequency (times per week) 3   ? Duration Progress to 30 minutes of continuous aerobic without signs/symptoms of physical distress   ?  ? Intensity  ? THRR 40-80% of Max Heartrate 65-130   ? Ratings of Perceived Exertion 11-13   ? Perceived Dyspnea 0-4   ?  ? Progression  ? Progression Continue progressive overload as per policy without signs/symptoms or physical distress.   ?  ? Resistance Training  ? Training Prescription Yes   ? Weight 3 lbs   ? Reps 10-15   ? ?  ?  ? ?  ? ? ?Perform Capillary Blood Glucose checks as needed. ? ?Exercise Prescription Changes: ? ? Exercise Prescription Changes   ? ? Row Name 12/29/21 0900 01/08/22 1200 01/15/22 0900 01/26/22 1200  ?  ?  ? Response to Exercise  ? Blood Pressure (Admit) 120/70 118/68 102/70 104/62   ? Blood Pressure (  Exercise) 138/80 136/72 158/72 112/72   ? Blood Pressure (Exit) 111/68 98/60 96/60  104/64   ? Heart Rate (Admit) 81 bpm 86 bpm 88 bpm  84 bpm   ? Heart Rate (Exercise) 106 bpm 107 bpm 101 bpm 96 bpm   ? Heart Rate (Exit) 89 bpm 93 bpm 88 bpm 84 bpm   ? Rating of Perceived Exertion (Exercise) 13 13 12  11.5   ? Symptoms Bilateral shoulder pain 3-4/10 left shoulder pain 5/10 None None   ? Comments Pt's first day in the CRP2 program Reviewed METs Reviewed METs Reviewed METs, goals and home exercise Rx   ? Duration Continue with 30 min of aerobic exercise without signs/symptoms of physical distress. Continue with 30 min of aerobic exercise without signs/symptoms of physical distress. Continue with 30 min of aerobic exercise without signs/symptoms of physical distress. Continue with 30 min of aerobic exercise without signs/symptoms of physical distress.   ? Intensity THRR unchanged THRR unchanged THRR unchanged THRR unchanged   ?  ? Progression  ? Progression Continue to progress workloads to maintain intensity without signs/symptoms of physical distress. Continue to progress workloads to maintain intensity without signs/symptoms of physical distress. Continue to progress workloads to maintain intensity without signs/symptoms of physical distress. Continue to progress workloads to maintain intensity without signs/symptoms of physical distress.   ? Average METs 1.9 2.3 2.5 2.35   ?  ? Resistance Training  ? Training Prescription Yes Yes Yes Yes   ? Weight 3 lbs 3 lbs 4 lbs 4 lbs   ? Reps 10-15 10-15 10-15 10-15   ? Time 10 Minutes 10 Minutes 10 Minutes 10 Minutes   ?  ? Interval Training  ? Interval Training No No No No   ?  ? NuStep  ? Level 2 2 2 2    ? SPM 75 85 -- --   ? Minutes 15 15 15 15    ? METs 2 2.6 2.8 2.5   ?  ? Arm Ergometer  ? Level 1  had to decrease level due to shoulder pain 1.8 1.8 1.8   ? RPM 40 -- -- --   ? Minutes 15 15 15 15    ? METs 1.8 2 2.2 2.2   ?  ? Home Exercise Plan  ? Plans to continue exercise at -- -- -- Home (comment)   ? Frequency -- -- -- Add 2 additional days to program exercise sessions.   ? Initial Home Exercises  Provided -- -- -- 01/26/22   ? ?  ?  ? ?  ? ? ?Exercise Comments: ? ? Exercise Comments   ? ? Row Name 12/29/21 1004 01/08/22 1208 01/15/22 1002 01/26/22 1422  ?  ? Exercise Comments Pt's first day in the C

## 2022-02-02 ENCOUNTER — Encounter (HOSPITAL_COMMUNITY): Payer: BC Managed Care – PPO

## 2022-02-04 ENCOUNTER — Other Ambulatory Visit: Payer: Self-pay | Admitting: Physician Assistant

## 2022-02-05 ENCOUNTER — Other Ambulatory Visit: Payer: Self-pay | Admitting: Physician Assistant

## 2022-02-05 ENCOUNTER — Encounter (HOSPITAL_COMMUNITY)
Admission: RE | Admit: 2022-02-05 | Discharge: 2022-02-05 | Disposition: A | Discharge status: Home / Self Care | Payer: BC Managed Care – PPO | Source: Ambulatory Visit | Attending: Internal Medicine | Admitting: Internal Medicine

## 2022-02-05 ENCOUNTER — Other Ambulatory Visit (HOSPITAL_COMMUNITY): Payer: Self-pay | Admitting: Family Medicine

## 2022-02-05 DIAGNOSIS — Z48812 Encounter for surgical aftercare following surgery on the circulatory system: Secondary | ICD-10-CM | POA: Diagnosis not present

## 2022-02-05 DIAGNOSIS — Z951 Presence of aortocoronary bypass graft: Secondary | ICD-10-CM

## 2022-02-06 ENCOUNTER — Encounter: Payer: Self-pay | Admitting: Pharmacist Clinician (PhC)/ Clinical Pharmacy Specialist

## 2022-02-06 ENCOUNTER — Ambulatory Visit (INDEPENDENT_AMBULATORY_CARE_PROVIDER_SITE_OTHER): Payer: BC Managed Care – PPO | Admitting: Pharmacist Clinician (PhC)/ Clinical Pharmacy Specialist

## 2022-02-06 DIAGNOSIS — E785 Hyperlipidemia, unspecified: Secondary | ICD-10-CM

## 2022-02-06 NOTE — Patient Instructions (Addendum)
Your Results: ?           ? Your most recent labs Goal  ?Total Cholesterol 182 < 200  ?Triglycerides 78 < 150  ?HDL (happy/good cholesterol) 63 > 40  ?LDL (lousy/bad cholesterol 103 < 55  ? ?Medication changes: ? We will start the process to get Repatha covered by your insurance.  Once approved, you will do one injection every 14 days.   ? ?Lab orders: ? Repeat labs after 4-6 doses (about 2-3 months) ? ? ?Thank you for choosing [facility]  ? ?

## 2022-02-06 NOTE — Progress Notes (Signed)
02/07/2022 ?Brett [name] [name] ?Feb 28, 1961 ?[id number] ? ? ?HPI:  Brett [name] is a 61 y.o. male patient of Dr Gala Romney, who presents today for a lipid clinic evaluation.  See pertinent past medical history below.   ? ?Past Medical History: ?Hypertension On Entresto, carvedilo, still not at goal.    ?DM2 12/22 A1c 6.6, down from 6.8  ?CAD 12/22 s/p CABG  x 5  ?CHF Secondary to ischemic cardiomyopathy; EF 40-45% by echo (3/23) improved from 30-35% in December; on carvedilol, Farxiga, Entresto  ? ? ?Current Medications:  pravastatin 40 mg qd ? ?Cholesterol Goals: LDL < 55 ?  ?Intolerant/previously tried: atorvastatin - diarrhea, rosuvastatin - GI distress ? ?Family history: father had cabg 2 years ago, now 6 doing well, has Parkinsons;  pgf died heart disease in late 47's; mother deceased; 3 siblings all healthy; no children ? ?Diet: mix of home and eating out; trying to eat better at home, and make better restaurant choices; trying to eat more veggies ? 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 ?Labs:  01/08/22: TC 182, TG 78, HDL 63, LDL 103 ? ? ?Current Outpatient Medications  ?Medication Sig Dispense Refill  ? aspirin 325 MG EC tablet TAKE 1 TABLET BY MOUTH EVERY DAY 90 tablet 1  ? CANNABIDIOL PO Take 1 tablet by mouth at bedtime as needed (sleep). CBD Gummies    ? carvedilol (COREG) 3.125 MG tablet Take 1 tablet (3.125 mg total) by mouth 2 (two) times daily with a meal. 60 tablet 3  ? dapagliflozin propanediol (FARXIGA) 10 MG TABS tablet Take 1 tablet (10 mg total) by mouth daily before breakfast. 30 tablet 3  ? furosemide (LASIX) 20 MG tablet Take 20 mg by mouth as needed.    ? gabapentin (NEURONTIN) 300 MG capsule Take 3 capsules (900 mg total) by mouth at bedtime. 90 capsule 0  ? metFORMIN (GLUCOPHAGE-XR) 750 MG 24 hr tablet Take 1 tablet (750 mg total) by mouth 2 (two) times daily. 60 tablet 3  ? pravastatin (PRAVACHOL) 40 MG tablet Take 1 tablet (40 mg total)  by mouth daily at 6 PM. 30 tablet 3  ? sacubitril-valsartan (ENTRESTO) 24-26 MG Take 1 tablet by mouth 2 (two) times daily. 60 tablet 3  ? ?No current facility-administered medications for this visit.  ? ? ?Allergies  ?Allergen Reactions  ? Penicillin G Hives  ? Lipitor [Atorvastatin Calcium] Diarrhea  ? Other Other (See Comments)  ? Ozempic (0.25 Or 0.5 Mg-Dose) [Semaglutide(0.25 Or 0.5mg -Dos)] Diarrhea  ? Rosuvastatin Other (See Comments)  ?  GI cramping  ? ? ?Past Medical History:  ?Diagnosis Date  ? Cardiomyopathy (HCC)   ? CHF (congestive heart failure) (HCC)   ? Coronary artery disease   ? Diabetes mellitus without complication (HCC)   ? Diabetic peripheral neuropathy (HCC)   ? Diabetic retinopathy (HCC)   ? Diverticulitis   ? Dyspnea   ? Elevated LDL cholesterol level   ? Family history of adverse reaction to anesthesia   ? Patient states his dad "woke up crazy from anesthesia and had to be restrained"  ? Hypertension   ? Pharyngoesophageal dysphagia   ? ? ?Blood pressure 124/78, pulse 70, resp. rate 14, height 5\' 10"  (1.778 m), weight 201 lb 3.2 oz (91.3 kg), SpO2 99 %. ? ? ?Hyperlipidemia ?Patient with history of ASCVD and LDL not to goal on maximally tolerated statin - pravastatin 40 mg.  Reviewed options for lowering  LDL cholesterol, including ezetimibe, PCSK-9 inhibitors, bempedoic acid and inclisiran.  Discussed mechanisms of action, dosing, side effects and potential decreases in LDL cholesterol.  Also reviewed cost information and potential options for patient assistance.  Answered all patient questions.  Based on this information, patient would prefer to start  Repatha 140 mg q14d.  He will need labs after 4-6 doses.  ? ? ?Phillips Hay PharmD CPP [facility] ?[facility] ?[address] ?[place], [place] [address] ?[phone] ? ? ? ?

## 2022-02-07 ENCOUNTER — Encounter: Payer: Self-pay | Admitting: Pharmacist Clinician (PhC)/ Clinical Pharmacy Specialist

## 2022-02-07 ENCOUNTER — Encounter (HOSPITAL_COMMUNITY): Payer: BC Managed Care – PPO

## 2022-02-07 DIAGNOSIS — E785 Hyperlipidemia, unspecified: Secondary | ICD-10-CM | POA: Insufficient documentation

## 2022-02-07 NOTE — Assessment & Plan Note (Signed)
Patient with history of ASCVD and LDL not to goal on maximally tolerated statin - pravastatin 40 mg.  Reviewed options for lowering LDL cholesterol, including ezetimibe, PCSK-9 inhibitors, bempedoic acid and inclisiran.  Discussed mechanisms of action, dosing, side effects and potential decreases in LDL cholesterol.  Also reviewed cost information and potential options for patient assistance.  Answered all patient questions.  Based on this information, patient would prefer to start  Repatha 140 mg q14d.  He will need labs after 4-6 doses.  ? ?

## 2022-02-08 ENCOUNTER — Telehealth: Payer: Self-pay

## 2022-02-08 DIAGNOSIS — Z951 Presence of aortocoronary bypass graft: Secondary | ICD-10-CM

## 2022-02-08 DIAGNOSIS — I2510 Atherosclerotic heart disease of native coronary artery without angina pectoris: Secondary | ICD-10-CM

## 2022-02-08 DIAGNOSIS — E785 Hyperlipidemia, unspecified: Secondary | ICD-10-CM

## 2022-02-08 MED ORDER — REPATHA SURECLICK 140 MG/ML ~~LOC~~ SOAJ: 140.0000 mg | SUBCUTANEOUS | 11 refills | Status: DC

## 2022-02-08 NOTE — Telephone Encounter (Signed)
Completed the pa for repatha 140mg  as requested by dr. . Also put in orders for lipid/hepatic panel and released them. I will route this msg to myself so that I can call the pt after 8am and then be able to send the prescription.  ?

## 2022-02-08 NOTE — Telephone Encounter (Signed)
Called and spoke to pt and stated that they were approved for the repatha, rx sent, instructed the pt to complete fasting labs post 4th dose no appt needed, pt informed me that he was already given copay card and I voiced understanding.  ?

## 2022-02-08 NOTE — Telephone Encounter (Signed)
-----   Message from Rosalee Kaufman, RPH-CPP sent at 02/07/2022  2:29 PM EDT ----- ?Regarding: repatha ?Could you please do Repatha PA for this? ? ?Thank you ? ?K ? ?

## 2022-02-09 ENCOUNTER — Encounter (HOSPITAL_COMMUNITY): Payer: BC Managed Care – PPO

## 2022-02-12 ENCOUNTER — Encounter (HOSPITAL_COMMUNITY): Payer: BC Managed Care – PPO

## 2022-02-14 ENCOUNTER — Encounter (HOSPITAL_COMMUNITY): Payer: BC Managed Care – PPO

## 2022-02-15 NOTE — Progress Notes (Signed)
Discharge Progress Report ? ?Patient Details  ?Name: Brett Walker ?MRN: [id number] ?Date of Birth: 02/02/1961 ?Referring Provider:   ?Flowsheet Row CARDIAC REHAB PHASE II ORIENTATION from 12/21/2021 in [place]  ?Referring Provider Glori Bickers, MD  ? ?  ? ? ? ?Number of Visits: 11 ? ?Reason for Discharge:  ?Early Exit:  Lack of attendance ? ?Smoking History:  ?Social History  ? ?Tobacco Use  ?Smoking Status Never  ?Smokeless Tobacco Never  ? ? 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 ?ADL UCSD: ? ? ?Initial Exercise Prescription: ? Initial Exercise Prescription - 12/21/21 0900   ? ?  ? Date of Initial Exercise RX and Referring Provider  ? Date 12/21/21   ? Referring Provider Glori Bickers, MD   ? Expected Discharge Date 02/16/22   ?  ? NuStep  ? Level 3   ? SPM 75   ? Minutes 15   ? METs 2.8   ?  ? Arm Ergometer  ? Level 1.8   ? RPM 60   ? Minutes 15   ? METs 2.4   ?  ? Prescription Details  ? Frequency (times per week) 3   ? Duration Progress to 30 minutes of continuous aerobic without signs/symptoms of physical distress   ?  ? Intensity  ? THRR 40-80% of Max Heartrate 65-130   ? Ratings of Perceived Exertion 11-13   ? Perceived Dyspnea 0-4   ?  ? Progression  ? Progression Continue progressive overload as per policy without signs/symptoms or physical distress.   ?  ? Resistance Training  ? Training Prescription Yes   ? Weight 3 lbs   ? Reps 10-15   ? ?  ?  ? ?  ? ? ?Discharge Exercise Prescription (Final Exercise Prescription Changes): ? Exercise Prescription Changes - 01/26/22 1200   ? ?  ? Response to Exercise  ? Blood Pressure (Admit) 104/62   ? Blood Pressure (Exercise) 112/72   ? Blood Pressure (Exit) 104/64   ? Heart Rate (Admit) 84 bpm   ? Heart Rate (Exercise) 96 bpm   ? Heart Rate (Exit) 84 bpm   ? Rating of Perceived Exertion (Exercise) 11.5   ? Symptoms None   ? Comments Reviewed METs, goals and home exercise Rx   ? Duration Continue with 30 min of aerobic  exercise without signs/symptoms of physical distress.   ? Intensity THRR unchanged   ?  ? Progression  ? Progression Continue to progress workloads to maintain intensity without signs/symptoms of physical distress.   ? Average METs 2.35   ?  ? Resistance Training  ? Training Prescription Yes   ? Weight 4 lbs   ? Reps 10-15   ? Time 10 Minutes   ?  ? Interval Training  ? Interval Training No   ?  ? NuStep  ? Level 2   ? Minutes 15   ? METs 2.5   ?  ? Arm Ergometer  ? Level 1.8   ? Minutes 15   ? METs 2.2   ?  ? Home Exercise Plan  ? Plans to continue exercise at Home (comment)   ? Frequency Add 2 additional days to program exercise sessions.   ? Initial Home Exercises Provided 01/26/22   ? ?  ?  ? ?  ? ? ?Functional Capacity: ? 6 Minute Walk   ? ? [place] Name 12/21/21 [id number]  ?  ?  ?  ?  6 Minute Walk  ? Phase Initial    ? Distance 1448 feet    ? Walk Time 6 minutes    ? # of Rest Breaks 0    ? MPH 2.74    ? METS 3.56    ? RPE 10    ? Perceived Dyspnea  0    ? VO2 Peak 12.46    ? Symptoms Yes (comment)    ? Comments Post surgical chest soreness, pt denies pain    ? Resting HR 84 bpm    ? Resting BP 114/62    ? Resting Oxygen Saturation  99 %    ? Exercise Oxygen Saturation  during 6 min walk 99 %    ? Max Ex. HR 96 bpm    ? Max Ex. BP 110/68    ? 2 Minute Post BP 106/70    ? ?  ?  ? ?  ? ? ?Psychological, QOL, Others - Outcomes: ?PHQ 2/9: ? ?  12/21/2021  ?  9:00 AM  ?Depression screen PHQ 2/9  ?Decreased Interest 0  ?Down, Depressed, Hopeless 0  ?PHQ - 2 Score 0  ? ? ?Quality of Life: ? Quality of Life - 12/21/21 0938   ? ?  ? Quality of Life  ? Select Quality of Life   ?  ? Quality of Life Scores  ? Health/Function Pre 17.83 %   ? Socioeconomic Pre 17.14 %   ? Psych/Spiritual Pre 12.93 %   ? Family Pre 24.17 %   ? GLOBAL Pre 17.2 %   ? ?  ?  ? ?  ? ? ?Personal Goals: ?Goals established at orientation with interventions provided to work toward goal. ? Personal Goals and Risk Factors at Admission - 12/21/21 0940   ? ?  ?  Core Components/Risk Factors/Patient Goals on Admission  ?  Weight Management Yes;Weight Maintenance   ? Intervention Weight Management: Develop a combined nutrition and exercise program designed to reach desired caloric intake, while maintaining appropriate intake of nutrient and fiber, sodium and fats, and appropriate energy expenditure required for the weight goal.;Weight Management: Provide education and appropriate resources to help participant work on and attain dietary goals.   ? Diabetes Yes   ? Intervention Provide education about signs/symptoms and action to take for hypo/hyperglycemia.;Provide education about proper nutrition, including hydration, and aerobic/resistive exercise prescription along with prescribed medications to achieve blood glucose in normal ranges: Fasting glucose 65-99 mg/dL   ? Expected Outcomes Short Term: Participant verbalizes understanding of the signs/symptoms and immediate care of hyper/hypoglycemia, proper foot care and importance of medication, aerobic/resistive exercise and nutrition plan for blood glucose control.;Long Term: Attainment of HbA1C < 7%.   ? Lipids Yes   ? Intervention Provide education and support for participant on nutrition & aerobic/resistive exercise along with prescribed medications to achieve LDL 70mg , HDL >40mg .   ? Expected Outcomes Short Term: Participant states understanding of desired cholesterol values and is compliant with medications prescribed. Participant is following exercise prescription and nutrition guidelines.;Long Term: Cholesterol controlled with medications as prescribed, with individualized exercise RX and with personalized nutrition plan. Value goals: LDL < 70mg , HDL > 40 mg.   ? Stress Yes   ? Intervention Offer individual and/or small group education and counseling on adjustment to heart disease, stress management and health-related lifestyle change. Teach and support self-help strategies.;Refer participants experiencing significant  psychosocial distress to appropriate mental health specialists for further evaluation and treatment. When possible, include family members and  significant others in education/counseling sessions.   ? Expected Outcomes Short Term: Participant demonstrates changes in health-related behavior, relaxation and other stress management skills, ability to obtain effective social support, and compliance with psychotropic medications if prescribed.;Long Term: Emotional wellbeing is indicated by absence of clinically significant psychosocial distress or social isolation.   ? ?  ?  ? ?  ?  ? ?Personal Goals Discharge: ? Goals and Risk Factor Review   ? ? [place] Name 12/29/21 1207 01/03/22 1130 01/31/22 1434  ?  ?  ?  ? Core Components/Risk Factors/Patient Goals Review  ? Personal Goals Review Weight Management/Obesity;Lipids;Stress;Diabetes Weight Management/Obesity;Lipids;Stress;Diabetes Weight Management/Obesity;Lipids;Stress;Diabetes    ? Review Clair Gulling started cardiac rehab on 12/29/21. Clair Gulling did well with exercise. Vital signs and CBG's were stable Clair Gulling is off to a good start to exercise at cardiac rehab. CBG's and vial signs have been stable. John retruned to work on 01/03/22 Clair Gulling is doing well with exercise. Vital signs and CBG's have been stable. Clair Gulling feels better since he returned to work. Clair Gulling will complete phase 2 cardiac rehab on 02/16/22    ? Expected Outcomes Clair Gulling will continue to participate in phase 2 cardiac rehab for exercise, nutrition and lifestyle modifications Clair Gulling will continue to participate in phase 2 cardiac rehab for exercise, nutrition and lifestyle modifications Clair Gulling will continue to participate in phase 2 cardiac rehab for exercise, nutrition and lifestyle modifications    ? ?  ?  ? ?  ? ? ?Exercise Goals and Review: ? Exercise Goals   ? ? [place] Name 12/21/21 [phone]  ?  ?  ?  ?  ?  ? Exercise Goals  ? Increase Physical Activity Yes      ? Intervention Provide advice, education, support and counseling about physical  activity/exercise needs.;Develop an individualized exercise prescription for aerobic and resistive training based on initial evaluation findings, risk stratification, comorbidities and participant's personal go

## 2022-02-16 ENCOUNTER — Encounter (HOSPITAL_COMMUNITY): Payer: BC Managed Care – PPO

## 2022-03-02 DIAGNOSIS — E113512 Type 2 diabetes mellitus with proliferative diabetic retinopathy with macular edema, left eye: Secondary | ICD-10-CM | POA: Diagnosis not present

## 2022-03-06 DIAGNOSIS — E113511 Type 2 diabetes mellitus with proliferative diabetic retinopathy with macular edema, right eye: Secondary | ICD-10-CM | POA: Diagnosis not present

## 2022-04-10 DIAGNOSIS — E113512 Type 2 diabetes mellitus with proliferative diabetic retinopathy with macular edema, left eye: Secondary | ICD-10-CM | POA: Diagnosis not present

## 2022-04-13 DIAGNOSIS — E113511 Type 2 diabetes mellitus with proliferative diabetic retinopathy with macular edema, right eye: Secondary | ICD-10-CM | POA: Diagnosis not present

## 2022-04-18 ENCOUNTER — Other Ambulatory Visit: Payer: Self-pay | Admitting: Physician Assistant

## 2022-05-08 DIAGNOSIS — E113512 Type 2 diabetes mellitus with proliferative diabetic retinopathy with macular edema, left eye: Secondary | ICD-10-CM | POA: Diagnosis not present

## 2022-05-09 ENCOUNTER — Other Ambulatory Visit (HOSPITAL_COMMUNITY): Payer: Self-pay | Admitting: Family Medicine

## 2022-05-14 ENCOUNTER — Other Ambulatory Visit (HOSPITAL_COMMUNITY): Payer: Self-pay | Admitting: Family Medicine

## 2022-05-18 DIAGNOSIS — E113511 Type 2 diabetes mellitus with proliferative diabetic retinopathy with macular edema, right eye: Secondary | ICD-10-CM | POA: Diagnosis not present

## 2022-05-29 DIAGNOSIS — R972 Elevated prostate specific antigen [PSA]: Secondary | ICD-10-CM | POA: Diagnosis not present

## 2022-05-29 DIAGNOSIS — I10 Essential (primary) hypertension: Secondary | ICD-10-CM | POA: Diagnosis not present

## 2022-05-29 DIAGNOSIS — E7800 Pure hypercholesterolemia, unspecified: Secondary | ICD-10-CM | POA: Diagnosis not present

## 2022-05-29 DIAGNOSIS — E1142 Type 2 diabetes mellitus with diabetic polyneuropathy: Secondary | ICD-10-CM | POA: Diagnosis not present

## 2022-05-29 DIAGNOSIS — E1169 Type 2 diabetes mellitus with other specified complication: Secondary | ICD-10-CM | POA: Diagnosis not present

## 2022-06-04 ENCOUNTER — Other Ambulatory Visit (HOSPITAL_COMMUNITY): Payer: Self-pay | Admitting: Internal Medicine

## 2022-06-12 DIAGNOSIS — E113512 Type 2 diabetes mellitus with proliferative diabetic retinopathy with macular edema, left eye: Secondary | ICD-10-CM | POA: Diagnosis not present

## 2022-06-14 ENCOUNTER — Other Ambulatory Visit (HOSPITAL_COMMUNITY): Payer: Self-pay | Admitting: Family Medicine

## 2022-06-19 DIAGNOSIS — E113511 Type 2 diabetes mellitus with proliferative diabetic retinopathy with macular edema, right eye: Secondary | ICD-10-CM | POA: Diagnosis not present

## 2022-06-29 DIAGNOSIS — H3582 Retinal ischemia: Secondary | ICD-10-CM | POA: Diagnosis not present

## 2022-06-29 DIAGNOSIS — H25813 Combined forms of age-related cataract, bilateral: Secondary | ICD-10-CM | POA: Diagnosis not present

## 2022-06-29 DIAGNOSIS — H35371 Puckering of macula, right eye: Secondary | ICD-10-CM | POA: Diagnosis not present

## 2022-06-29 DIAGNOSIS — E113513 Type 2 diabetes mellitus with proliferative diabetic retinopathy with macular edema, bilateral: Secondary | ICD-10-CM | POA: Diagnosis not present

## 2022-07-09 DIAGNOSIS — E113511 Type 2 diabetes mellitus with proliferative diabetic retinopathy with macular edema, right eye: Secondary | ICD-10-CM | POA: Diagnosis not present

## 2022-07-17 ENCOUNTER — Other Ambulatory Visit (HOSPITAL_COMMUNITY): Payer: Self-pay | Admitting: Family Medicine

## 2022-07-21 ENCOUNTER — Other Ambulatory Visit (HOSPITAL_COMMUNITY): Payer: Self-pay | Admitting: Family Medicine

## 2022-08-30 DIAGNOSIS — E113513 Type 2 diabetes mellitus with proliferative diabetic retinopathy with macular edema, bilateral: Secondary | ICD-10-CM | POA: Diagnosis not present

## 2022-08-30 DIAGNOSIS — D518 Other vitamin B12 deficiency anemias: Secondary | ICD-10-CM | POA: Diagnosis not present

## 2022-08-30 DIAGNOSIS — I509 Heart failure, unspecified: Secondary | ICD-10-CM | POA: Diagnosis not present

## 2022-08-30 DIAGNOSIS — E1165 Type 2 diabetes mellitus with hyperglycemia: Secondary | ICD-10-CM | POA: Diagnosis not present

## 2022-08-30 DIAGNOSIS — I255 Ischemic cardiomyopathy: Secondary | ICD-10-CM | POA: Diagnosis not present

## 2022-08-31 ENCOUNTER — Telehealth: Payer: Self-pay | Admitting: Pharmacist

## 2022-08-31 NOTE — Telephone Encounter (Signed)
Received request for more information from insurance for Repatha PA. Appears he got new insurance. Information faxed back.

## 2022-09-03 DIAGNOSIS — H4312 Vitreous hemorrhage, left eye: Secondary | ICD-10-CM | POA: Diagnosis not present

## 2022-09-03 DIAGNOSIS — H3582 Retinal ischemia: Secondary | ICD-10-CM | POA: Diagnosis not present

## 2022-09-03 DIAGNOSIS — H35371 Puckering of macula, right eye: Secondary | ICD-10-CM | POA: Diagnosis not present

## 2022-09-03 DIAGNOSIS — H53132 Sudden visual loss, left eye: Secondary | ICD-10-CM | POA: Diagnosis not present

## 2022-09-03 NOTE — Telephone Encounter (Signed)
Repatha PA approved  to 09/02/23

## 2022-09-16 ENCOUNTER — Other Ambulatory Visit (HOSPITAL_COMMUNITY): Payer: Self-pay | Admitting: Family Medicine

## 2022-09-18 DIAGNOSIS — H4312 Vitreous hemorrhage, left eye: Secondary | ICD-10-CM | POA: Diagnosis not present

## 2022-09-27 DIAGNOSIS — E113512 Type 2 diabetes mellitus with proliferative diabetic retinopathy with macular edema, left eye: Secondary | ICD-10-CM | POA: Diagnosis not present

## 2022-10-08 DIAGNOSIS — E113511 Type 2 diabetes mellitus with proliferative diabetic retinopathy with macular edema, right eye: Secondary | ICD-10-CM | POA: Diagnosis not present

## 2022-11-14 ENCOUNTER — Encounter (HOSPITAL_COMMUNITY): Payer: Self-pay

## 2022-11-14 ENCOUNTER — Other Ambulatory Visit (HOSPITAL_COMMUNITY): Payer: Self-pay | Admitting: Family Medicine

## 2022-11-14 ENCOUNTER — Ambulatory Visit (HOSPITAL_COMMUNITY)
Admission: RE | Admit: 2022-11-14 | Discharge: 2022-11-14 | Disposition: A | Discharge status: Home / Self Care | Payer: BC Managed Care – PPO | Source: Ambulatory Visit | Attending: Family Medicine | Admitting: Family Medicine

## 2022-11-14 VITALS — BP 130/82 | HR 72 | Wt 215.6 lb

## 2022-11-14 DIAGNOSIS — E1159 Type 2 diabetes mellitus with other circulatory complications: Secondary | ICD-10-CM

## 2022-11-14 DIAGNOSIS — I5022 Chronic systolic (congestive) heart failure: Secondary | ICD-10-CM | POA: Diagnosis not present

## 2022-11-14 DIAGNOSIS — Z79899 Other long term (current) drug therapy: Secondary | ICD-10-CM | POA: Diagnosis not present

## 2022-11-14 DIAGNOSIS — R0683 Snoring: Secondary | ICD-10-CM | POA: Diagnosis not present

## 2022-11-14 DIAGNOSIS — E785 Hyperlipidemia, unspecified: Secondary | ICD-10-CM | POA: Diagnosis not present

## 2022-11-14 DIAGNOSIS — I110 Hypertensive heart disease with heart failure: Secondary | ICD-10-CM | POA: Insufficient documentation

## 2022-11-14 DIAGNOSIS — Z951 Presence of aortocoronary bypass graft: Secondary | ICD-10-CM | POA: Insufficient documentation

## 2022-11-14 DIAGNOSIS — I255 Ischemic cardiomyopathy: Secondary | ICD-10-CM | POA: Diagnosis not present

## 2022-11-14 DIAGNOSIS — E119 Type 2 diabetes mellitus without complications: Secondary | ICD-10-CM | POA: Diagnosis not present

## 2022-11-14 DIAGNOSIS — I10 Essential (primary) hypertension: Secondary | ICD-10-CM

## 2022-11-14 DIAGNOSIS — I2510 Atherosclerotic heart disease of native coronary artery without angina pectoris: Secondary | ICD-10-CM | POA: Insufficient documentation

## 2022-11-14 LAB — COMPREHENSIVE METABOLIC PANEL
ALT: 20 U/L (ref 0–44)
AST: 16 U/L (ref 15–41)
Albumin: 3.2 g/dL — ABNORMAL LOW (ref 3.5–5.0)
Alkaline Phosphatase: 67 U/L (ref 38–126)
Anion gap: 6 (ref 5–15)
BUN: 21 mg/dL — ABNORMAL HIGH (ref 6–20)
CO2: 25 mmol/L (ref 22–32)
Calcium: 8.5 mg/dL — ABNORMAL LOW (ref 8.9–10.3)
Chloride: 106 mmol/L (ref 98–111)
Creatinine, Ser: 1.26 mg/dL — ABNORMAL HIGH (ref 0.61–1.24)
GFR, Estimated: >60 mL/min (ref >60)
Glucose, Bld: 93 mg/dL (ref 70–99)
Potassium: 3.9 mmol/L (ref 3.5–5.1)
Sodium: 137 mmol/L (ref 135–145)
Total Bilirubin: 0.6 mg/dL (ref 0.3–1.2)
Total Protein: 6 g/dL — ABNORMAL LOW (ref 6.5–8.1)

## 2022-11-14 LAB — BRAIN NATRIURETIC PEPTIDE: B Natriuretic Peptide: 261.8 pg/mL — ABNORMAL HIGH (ref 0.0–100.0)

## 2022-11-14 MED ORDER — DAPAGLIFLOZIN PROPANEDIOL 10 MG PO TABS: 10.0000 mg | ORAL_TABLET | Freq: Every day | ORAL | 3 refills | Status: DC

## 2022-11-14 MED ORDER — PRAVASTATIN SODIUM 40 MG PO TABS: 40.0000 mg | ORAL_TABLET | Freq: Every day | ORAL | 3 refills | Status: DC

## 2022-11-14 MED ORDER — ASPIRIN 81 MG PO TBEC: 81.0000 mg | DELAYED_RELEASE_TABLET | Freq: Every day | ORAL | 3 refills | Status: AC

## 2022-11-14 MED ORDER — CARVEDILOL 3.125 MG PO TABS: 3.1250 mg | ORAL_TABLET | Freq: Two times a day (BID) | ORAL | 3 refills | Status: DC

## 2022-11-14 MED ORDER — ENTRESTO 24-26 MG PO TABS: 1.0000 | ORAL_TABLET | Freq: Two times a day (BID) | ORAL | 8 refills | Status: DC

## 2022-11-14 NOTE — Patient Instructions (Addendum)
Thank you for coming in today  Labs were done today, if any labs are abnormal the clinic will call you No news is good news  RESTART Entresto 24/26 mg 1 tablet twice daily RESTART Asprin 81 mg 1 tablet daily  Your physician recommends that you schedule a follow-up appointment in:  4 months with Dr. Haroldine Laws with echocardiogram You will receive a reminder letter in the mail a few months in advance. If you don't receive a letter, please call our office to schedule the follow-up appointment.   Your physician has requested that you have an echocardiogram. Echocardiography is a painless test that uses sound waves to create images of your heart. It provides your doctor with information about the size and shape of your heart and how well your heart's chambers and valves are working. This procedure takes approximately one hour. There are no restrictions for this procedure.       Do the following things EVERYDAY: Weigh yourself in the morning before breakfast. Write it down and keep it in a log. Take your medicines as prescribed Eat low salt foods--Limit salt (sodium) to 2000 mg per day.  Stay as active as you can everyday Limit all fluids for the day to less than 2 liters  At the [facility], you and your health needs are our priority. As part of our continuing mission to provide you with exceptional heart care, we have created designated Provider Care Teams. These Care Teams include your primary Cardiologist (physician) and Advanced Practice Providers (APPs- Physician Assistants and Nurse Practitioners) who all work together to provide you with the care you need, when you need it.   You may see any of the following providers on your designated Care Team at your next follow up: Dr Glori Bickers Dr Loralie Champagne Dr. Roxana Hires, NP Lyda Jester, [place] [facility] [place], [place] Forestine Na, NP Audry Riles, PharmD   Please be sure to bring  in all your medications bottles to every appointment.   If you have any questions or concerns before your next appointment please send [place] a message through [place] or call our office at [phone].    TO LEAVE A MESSAGE FOR THE NURSE SELECT OPTION 2, PLEASE LEAVE A MESSAGE INCLUDING: YOUR NAME DATE OF BIRTH CALL BACK NUMBER REASON FOR CALL**this is important as we prioritize the call backs  YOU WILL RECEIVE A CALL BACK THE SAME DAY AS LONG AS YOU CALL BEFORE 4:00 PM

## 2022-11-14 NOTE — Progress Notes (Signed)
ADVANCED HF CLINIC NOTE   Primary Care: [name], MD CT Surgeon: Dr. Laneta Simmers HF Cardiologist: Dr. 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   Admitted 12/22 for CP. Echo EF 30-35%, GIIDD, RV normal. R/LHC showed severe 3VCAD Underwent CABG x 5 12/22(LIMA-LAD, SVG-Diag, sequential SVG-OM/OM2 and SVG-PDA).   Echo 01/08/22 EF 40-45% Akinesis of inferior, inferolateral and inferoseptal walls. G1DD, RV mild HK  Today he returns for HF follow up. Overall feeling fine. He works full time doing Programmer, applications. He is not short of breath with work duties or activity. Denies palpitations, abnormal bleeding, CP, dizziness, edema, or PND/Orthopnea. Appetite ok. No fever or chills. Weight at home 205-208 pounds. Ran out of Entresto x 1 month, he stopped ASA awhile ago unclear as to why. No ETOH/tobacco use/drugs.   Cardiac Studies - Echo (3/23): EF 40-45%, akinesis of inferior, inferolateral and inferoseptal walls. G1DD, RV mild HK  - Echo (12/22): EF 30-35%, global HK, RV normal, small pericardial effusion.  - R/LHC (12/22):   Ost RCA to Prox RCA lesion is 90% stenosed.   Dist RCA lesion is 99% stenosed.   RPDA lesion is 100% stenosed.   RPAV lesion is 100% stenosed.   Ost Cx to Prox Cx lesion is 80% stenosed.   1st Mrg lesion is 95% stenosed.   2nd Mrg lesion is 95% stenosed.   Prox LAD to Mid LAD lesion is 80% stenosed.   1st Diag lesion is 100% stenosed.   2nd Diag lesion is 99% stenosed.   Mid LAD to Dist LAD lesion is 75% stenosed.   The left ventricular ejection fraction is 25-35% by visual estimate.   Ao = 101/71 (88) LV = 102/5 RA =  2 RV = 27/5 PA = 30/12 (19) PCW = 2 Fick cardiac output/index = 7.3/3.5 PVR = 2.2 WU SVR = 941 Ao sat = 95% PA sat = 72%, 73% SVC sat = 75%  1. Severe 3v CAD 2. Low filling pressures with normal cardiac output 3. iCM EF ~25% 4. There was difficulty  passing swan across pulmonary valve but no significant pulmonic stenosis (may be just angle of PA outflow tract)  ROS: All systems reviewed and negative except as per HPI.   Past Medical History:  Diagnosis Date   Cardiomyopathy (HCC)    CHF (congestive heart failure) (HCC)    Coronary artery disease    Diabetes mellitus without complication (HCC)    Diabetic peripheral neuropathy (HCC)    Diabetic retinopathy (HCC)    Diverticulitis    Dyspnea    Elevated LDL cholesterol level    Family history of adverse reaction to anesthesia    Patient states his dad "woke up crazy from anesthesia and had to be restrained"   Hypertension    Pharyngoesophageal dysphagia    Current Outpatient Medications  Medication Sig Dispense Refill   carvedilol (COREG) 3.125 MG tablet TAKE 1 TABLET BY MOUTH TWICE A DAY WITH FOOD 180 tablet 1   cyanocobalamin (VITAMIN B12) 1000 MCG tablet Take 3,000 mcg by mouth daily.     Evolocumab (REPATHA SURECLICK) 140 MG/ML SOAJ Inject 140 mg into the skin every 14 (fourteen) days. 2 mL 11   FARXIGA 10 MG TABS tablet TAKE 1 TABLET (10 MG TOTAL) BY MOUTH DAILY BEFORE BREAKFAST. NEEDS FOLLOW UP APPOINTMENT FOR REFILLS 90 tablet 0   gabapentin (NEURONTIN) 300 MG capsule Take  3 capsules (900 mg total) by mouth at bedtime. 90 capsule 0   KERENDIA 10 MG TABS Take 1 tablet by mouth daily.     metFORMIN (GLUCOPHAGE-XR) 750 MG 24 hr tablet Take 1 tablet (750 mg total) by mouth 2 (two) times daily. 60 tablet 3   NON FORMULARY Alphalipoicoic acid 600mg  QD     pravastatin (PRAVACHOL) 40 MG tablet TAKE 1 TABLET BY MOUTH DAILY AT 6 PM. 90 tablet 3   sacubitril-valsartan (ENTRESTO) 24-26 MG Take 1 tablet by mouth 2 (two) times daily. NEEDS FOLLOW UP APPOINTMENT FOR ANYMORE REFILLS 180 tablet 0   No current facility-administered medications for this encounter.   Allergies  Allergen Reactions   Penicillin G Hives   Lipitor [Atorvastatin Calcium] Diarrhea   Other Other (See Comments)    Ozempic (0.25 Or 0.5 Mg-Dose) [Semaglutide(0.25 Or 0.5mg -Dos)] Diarrhea   Rosuvastatin Other (See Comments)    GI cramping   Social History   Socioeconomic History   Marital status: Single    Spouse name: Not on file   Number of children: 0   Years of education: 14   Highest education level: Not on file  Occupational History   Occupation: [employer]  Tobacco Use   Smoking status: Never   Smokeless tobacco: Never  Vaping Use   Vaping Use: Never used  Substance and Sexual Activity   Alcohol use: Not Currently    Comment: h/o heavy use   Drug use: Never   Sexual activity: Not on file  Other Topics Concern   Not on file  Social History Narrative   Works at [employer]   Right handed   First floor apartment   Social Determinants of Health   Financial Resource Strain: Not on file  Food Insecurity: Not on file  Transportation Needs: Not on file  Physical Activity: Not on file  Stress: Not on file  Social Connections: Not on file  Intimate Partner Violence: Not on file   Family History  Problem Relation Age of Onset   Parkinson's disease Father    CAD Father    Hydrocephalus Brother    CAD Paternal Grandfather    BP 130/82   Pulse 72   Wt 97.8 kg (215 lb 9.6 oz)   SpO2 97%   BMI 30.94 kg/m   Wt Readings from Last 3 Encounters:  11/14/22 97.8 kg (215 lb 9.6 oz)  02/06/22 91.3 kg (201 lb 3.2 oz)  01/08/22 91.7 kg (202 lb 3.2 oz)   PHYSICAL EXAM: General:  NAD. No resp difficulty HEENT: Normal Neck: Supple. No JVD. Carotids 2+ bilat; no bruits. No lymphadenopathy or thryomegaly appreciated. Cor: PMI nondisplaced. Regular rate & rhythm. No rubs, gallops or murmurs. Lungs: Clear, diminished in bases Abdomen: Obese, soft, nontender, nondistended. No hepatosplenomegaly. No bruits or masses. Good bowel sounds. Extremities: No cyanosis, clubbing, rash, edema Neuro: Alert & oriented x 3, cranial nerves grossly intact. Moves all 4 extremities w/o difficulty. Affect  pleasant.  ECG (personally reviewed): NSR 69 bpm  ASSESSMENT & PLAN:  1. Chronic Systolic Heart Failure - Ischemic CM 2/2 3V CAD - Echo 12/22: EF 30-35%, RV normal  - RHC 12/22: w/ Fick cardiac output/index = 7.3/3.5 - now s/p CABG 12/22 - Echo 01/08/22 EF 40-45% Akinesis of inferior, inferolateral and inferoseptal walls. G1DD, RV mild HK - NYHA I-II. Volume ok. - Restart Entresto 24/26 mg bid.  - Continue Farxiga 10 mg daily. - Continue kerendia 10 mg daily. - Continue Coreg  3.125 mg bid. - Continue Lasix 20 mg PRN - Labs today. - Repeat echo next visit to ensure EF stable.  2. Multivessel CAD - s/p CABG x 5 in 12/22 (LIMA-LAD, SVG-Diag, sequential SVG-OM/OM2 and SVG-PDA)  - Continue pravastatin 40 mg daily, GI upset with other statins. - Continue Repatha.   - No s/s angina - Restart ASA  3. Type 2DM  - Not currently on insulin. Now following with Endocrinology. - A1c 6.9 (11/23) - On SGLT2i.  - Failed GLP1RAs  4. Snoring - Not interested in sleep study  Follow up in 4 months with Dr. Haroldine Laws + echo.  Rafael Bihari, FNP  4:02 PM

## 2022-12-15 ENCOUNTER — Other Ambulatory Visit (HOSPITAL_COMMUNITY): Payer: Self-pay | Admitting: Family Medicine

## 2022-12-18 ENCOUNTER — Other Ambulatory Visit (HOSPITAL_COMMUNITY): Payer: Self-pay

## 2022-12-18 ENCOUNTER — Telehealth (HOSPITAL_COMMUNITY): Payer: Self-pay | Admitting: Pharmacy Technician

## 2022-12-18 MED ORDER — EMPAGLIFLOZIN 10 MG PO TABS: 10.0000 mg | ORAL_TABLET | Freq: Every day | ORAL | 3 refills | Status: AC

## 2022-12-18 NOTE — Telephone Encounter (Signed)
Advanced Heart Failure Patient Advocate Encounter  Patient's pharmacy sent over notification that [facility] is no longer covered. Upon further review, patients insurance prefers [place]. 90 day co-pay $105. Called and spoke with the patient. He is agreeable to the change. Sent 90 day RX request to [facility] to [facility]. Will send patient email with link to [place] co-pay card. Should update co-pay to $0.   Confirmed patient already has [place] co-pay card as well.    Charlann Boxer, CPhT

## 2023-02-17 ENCOUNTER — Other Ambulatory Visit: Payer: Self-pay | Admitting: Internal Medicine

## 2023-06-20 IMAGING — DX DG CHEST 1V PORT
1 series · 1 of 1 positions shown · non-contrast
Comparison: 09/26/2021

CLINICAL DATA: Short of breath, chest pain

EXAM:
PORTABLE CHEST 1 VIEW

[chest ap]
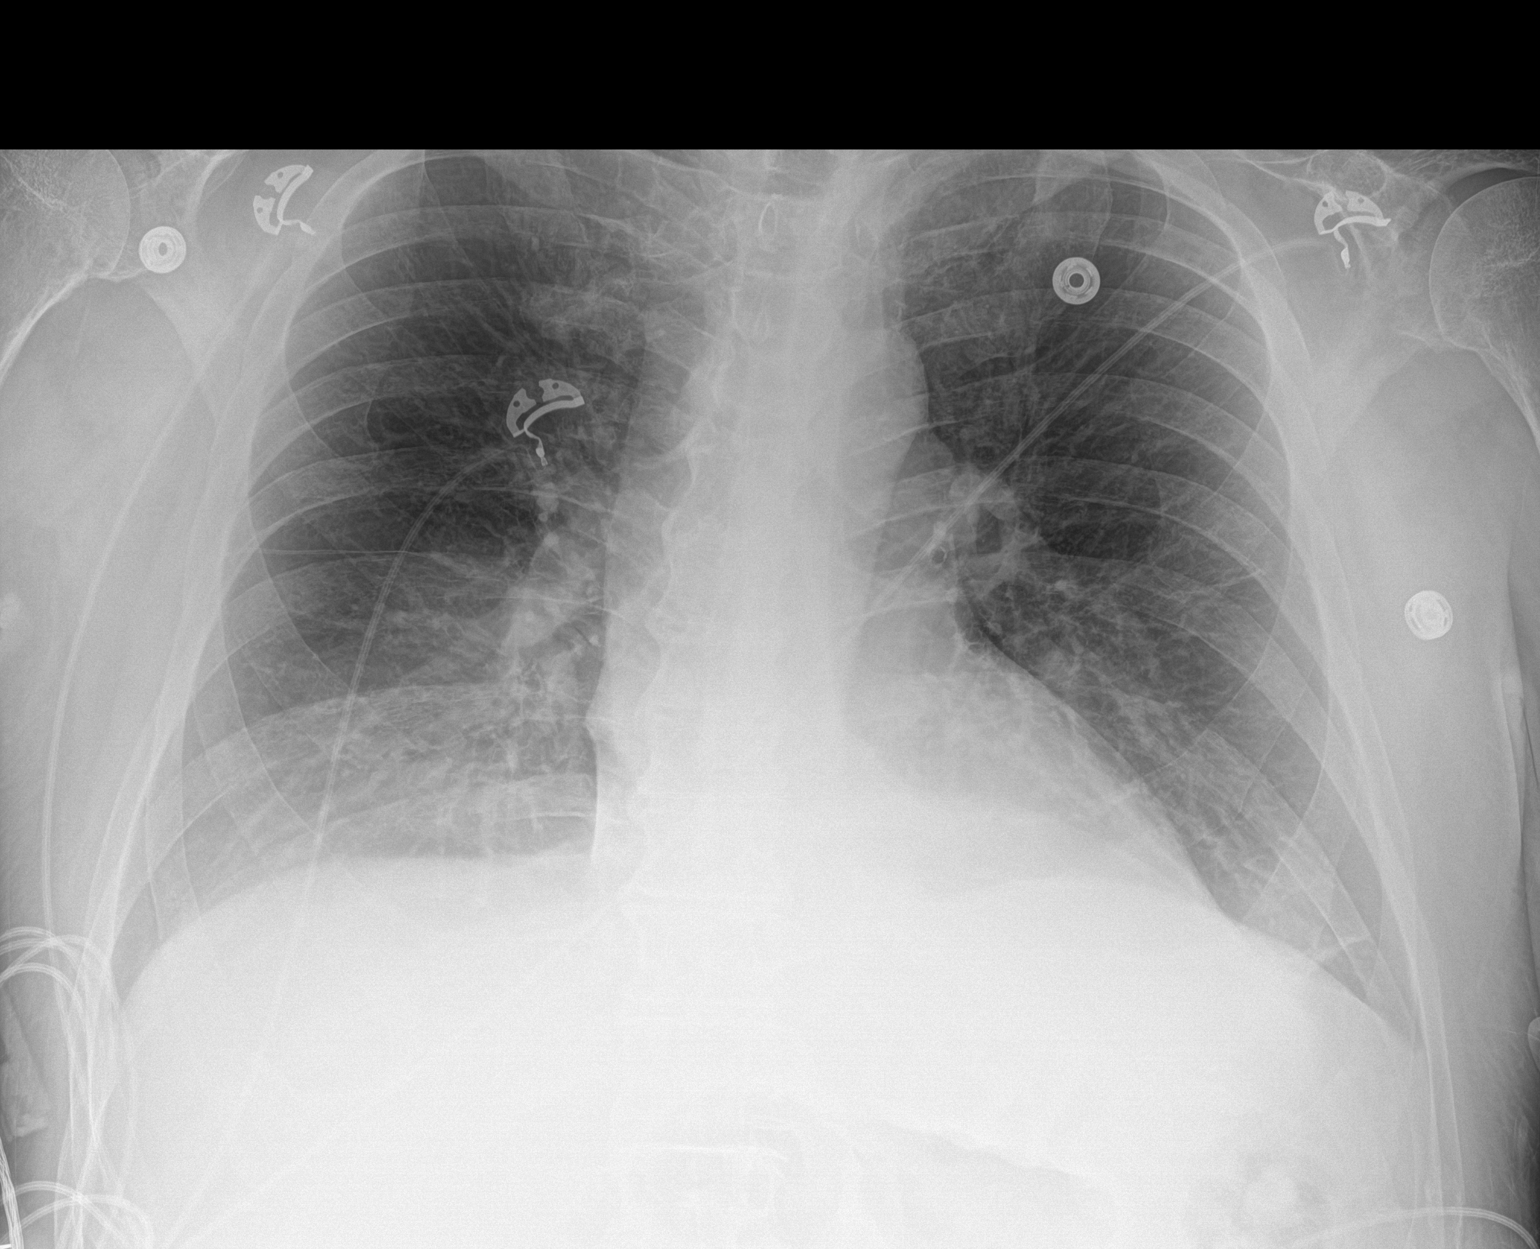

[1 of 1 positions shown; findings below may reference images not displayed]

FINDINGS: Single frontal view of the chest demonstrates a stable cardiac
silhouette. There is mild residual central vascular congestion, with
interval resolution of the bibasilar consolidation and effusions
seen previously. No pneumothorax. No acute bony abnormalities.
IMPRESSION: 1. Improved volume status, with only minimal residual central
vascular congestion.

## 2023-06-21 IMAGING — CR DG CHEST 2V
2 series · 2 of 2 positions shown · non-contrast
Comparison: 09/29/2021

CLINICAL DATA: Pleural effusion, chest pain.

EXAM:
CHEST - 2 VIEW

[chest pa]
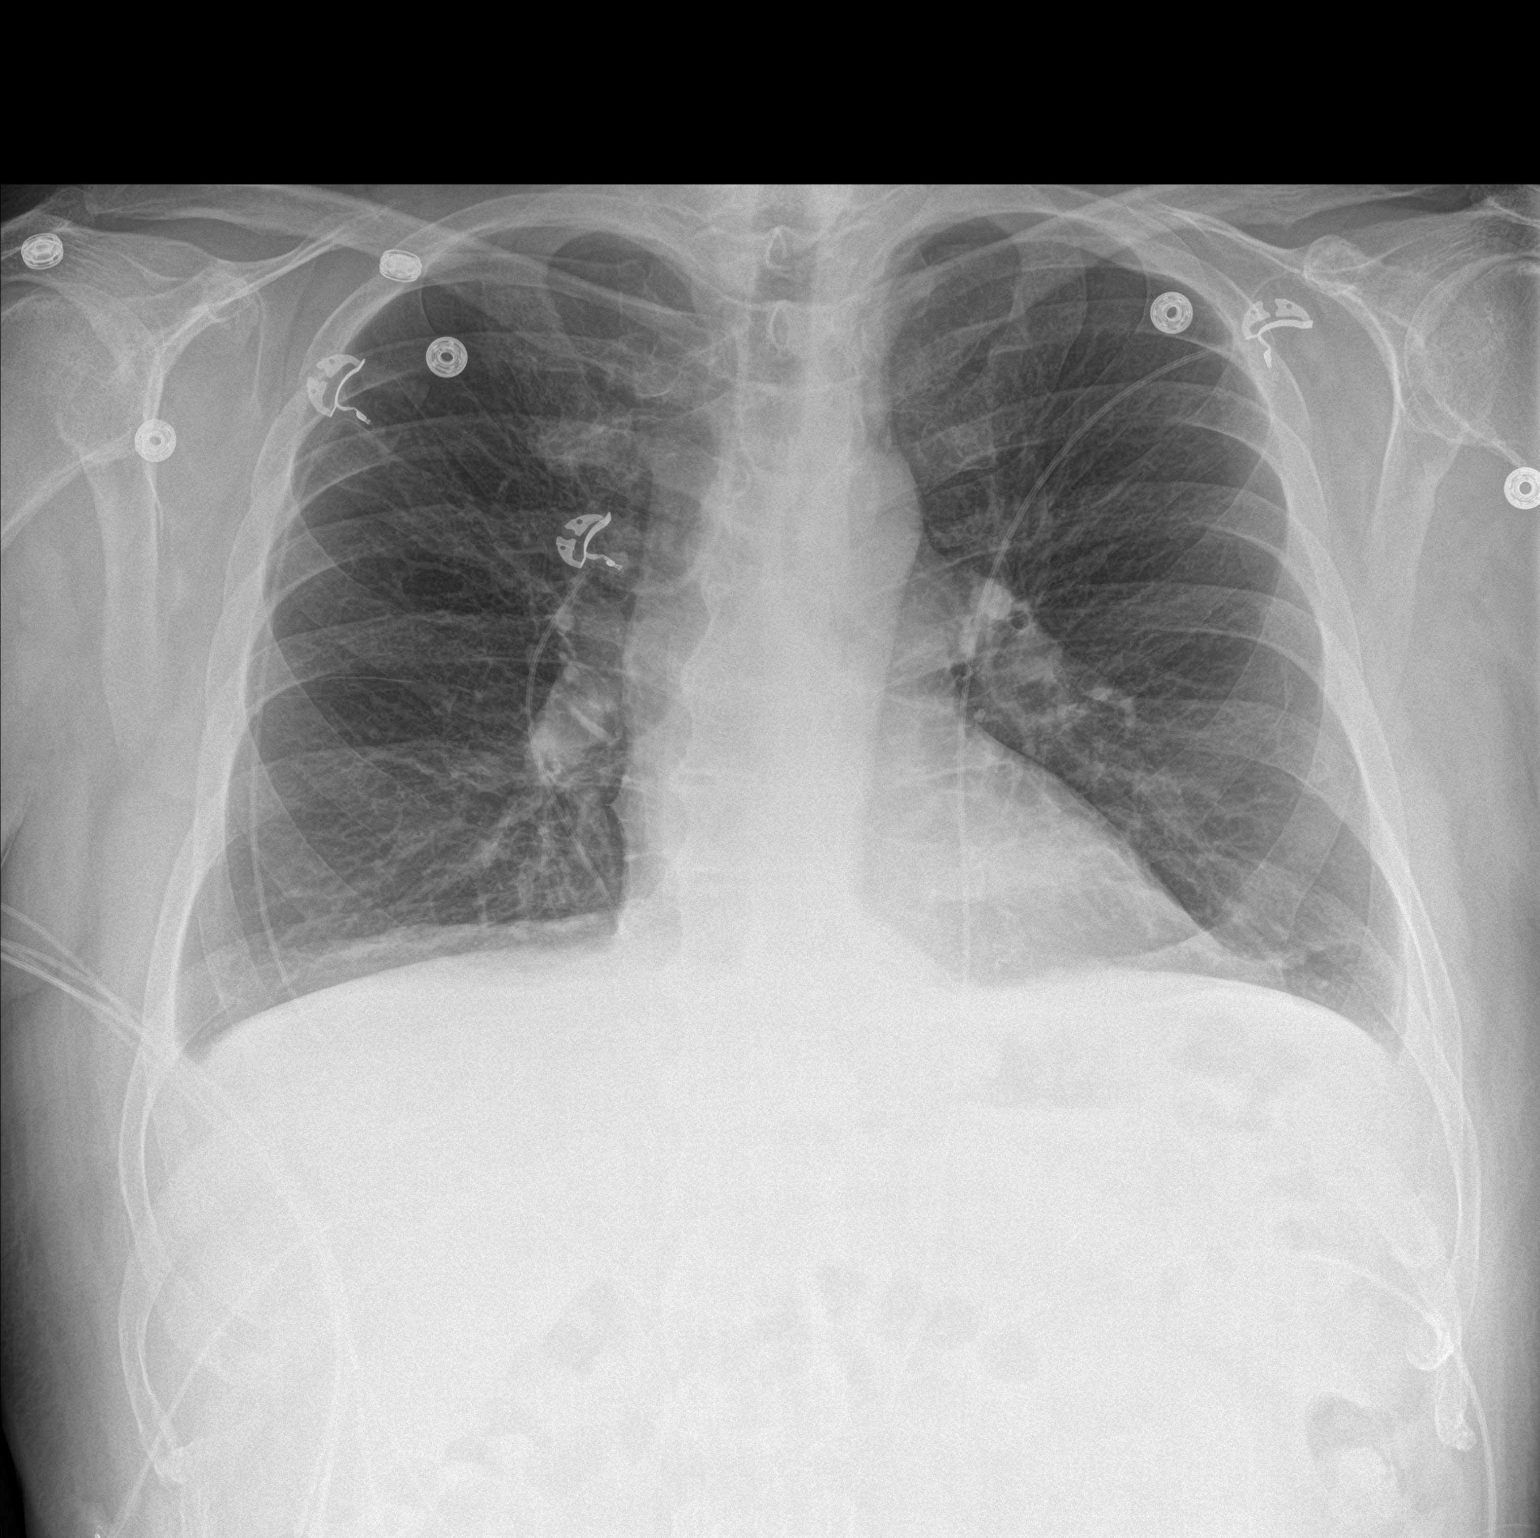

[chest lat]
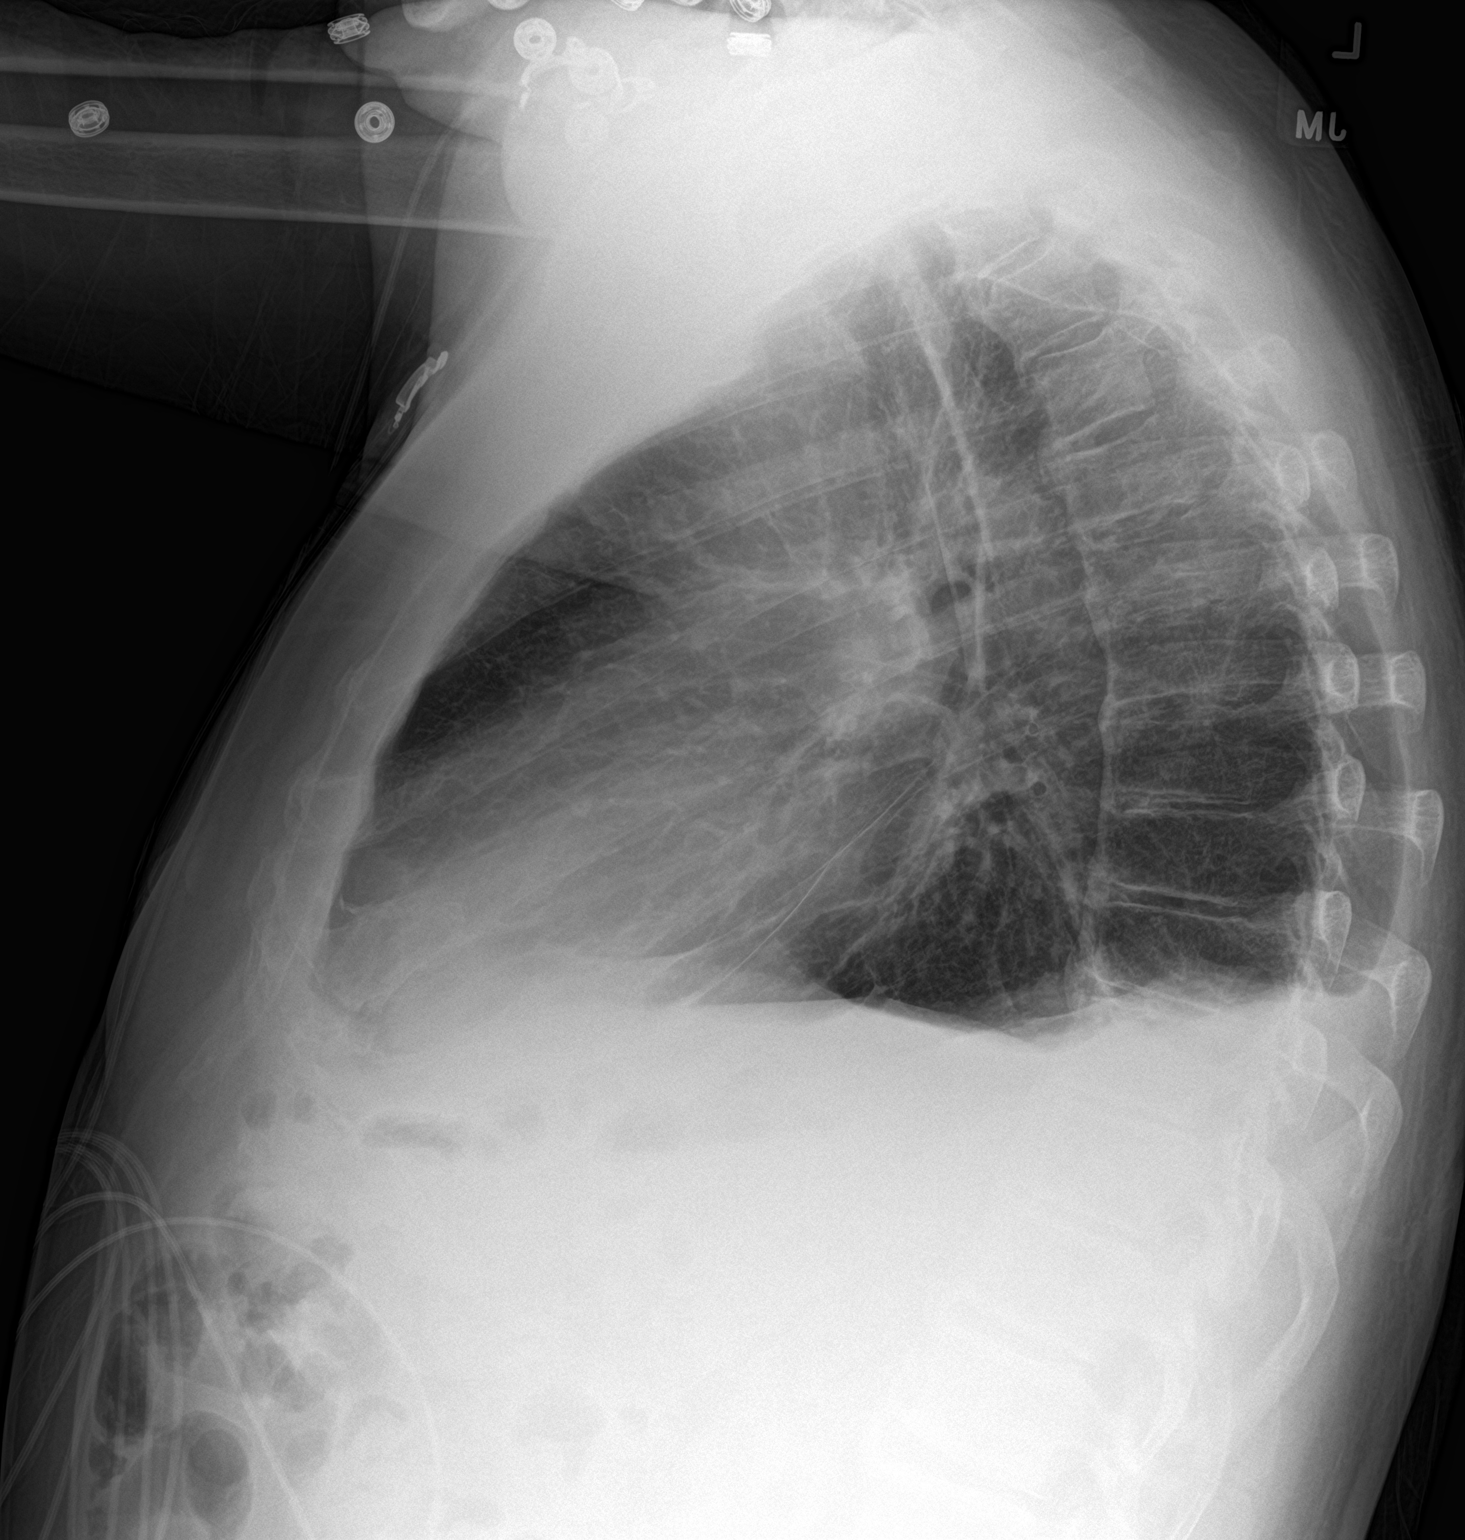

[2 of 2 positions shown; findings below may reference images not displayed]

FINDINGS: Improvement in bibasilar airspace disease. Small bilateral pleural
effusions are present. Normal pulmonary vascularity. Upper lobes
remain clear.
IMPRESSION: Decreased bibasilar atelectasis.  Small bilateral pleural effusions.

## 2023-07-30 IMAGING — DX DG CHEST 2V
2 series · 2 of 2 positions shown · non-contrast
Comparison: Chest x-ray 10/10/2021

CLINICAL DATA: Status post CABG

EXAM:
CHEST - 2 VIEW

[dg chest 2 view (1 of 2)]
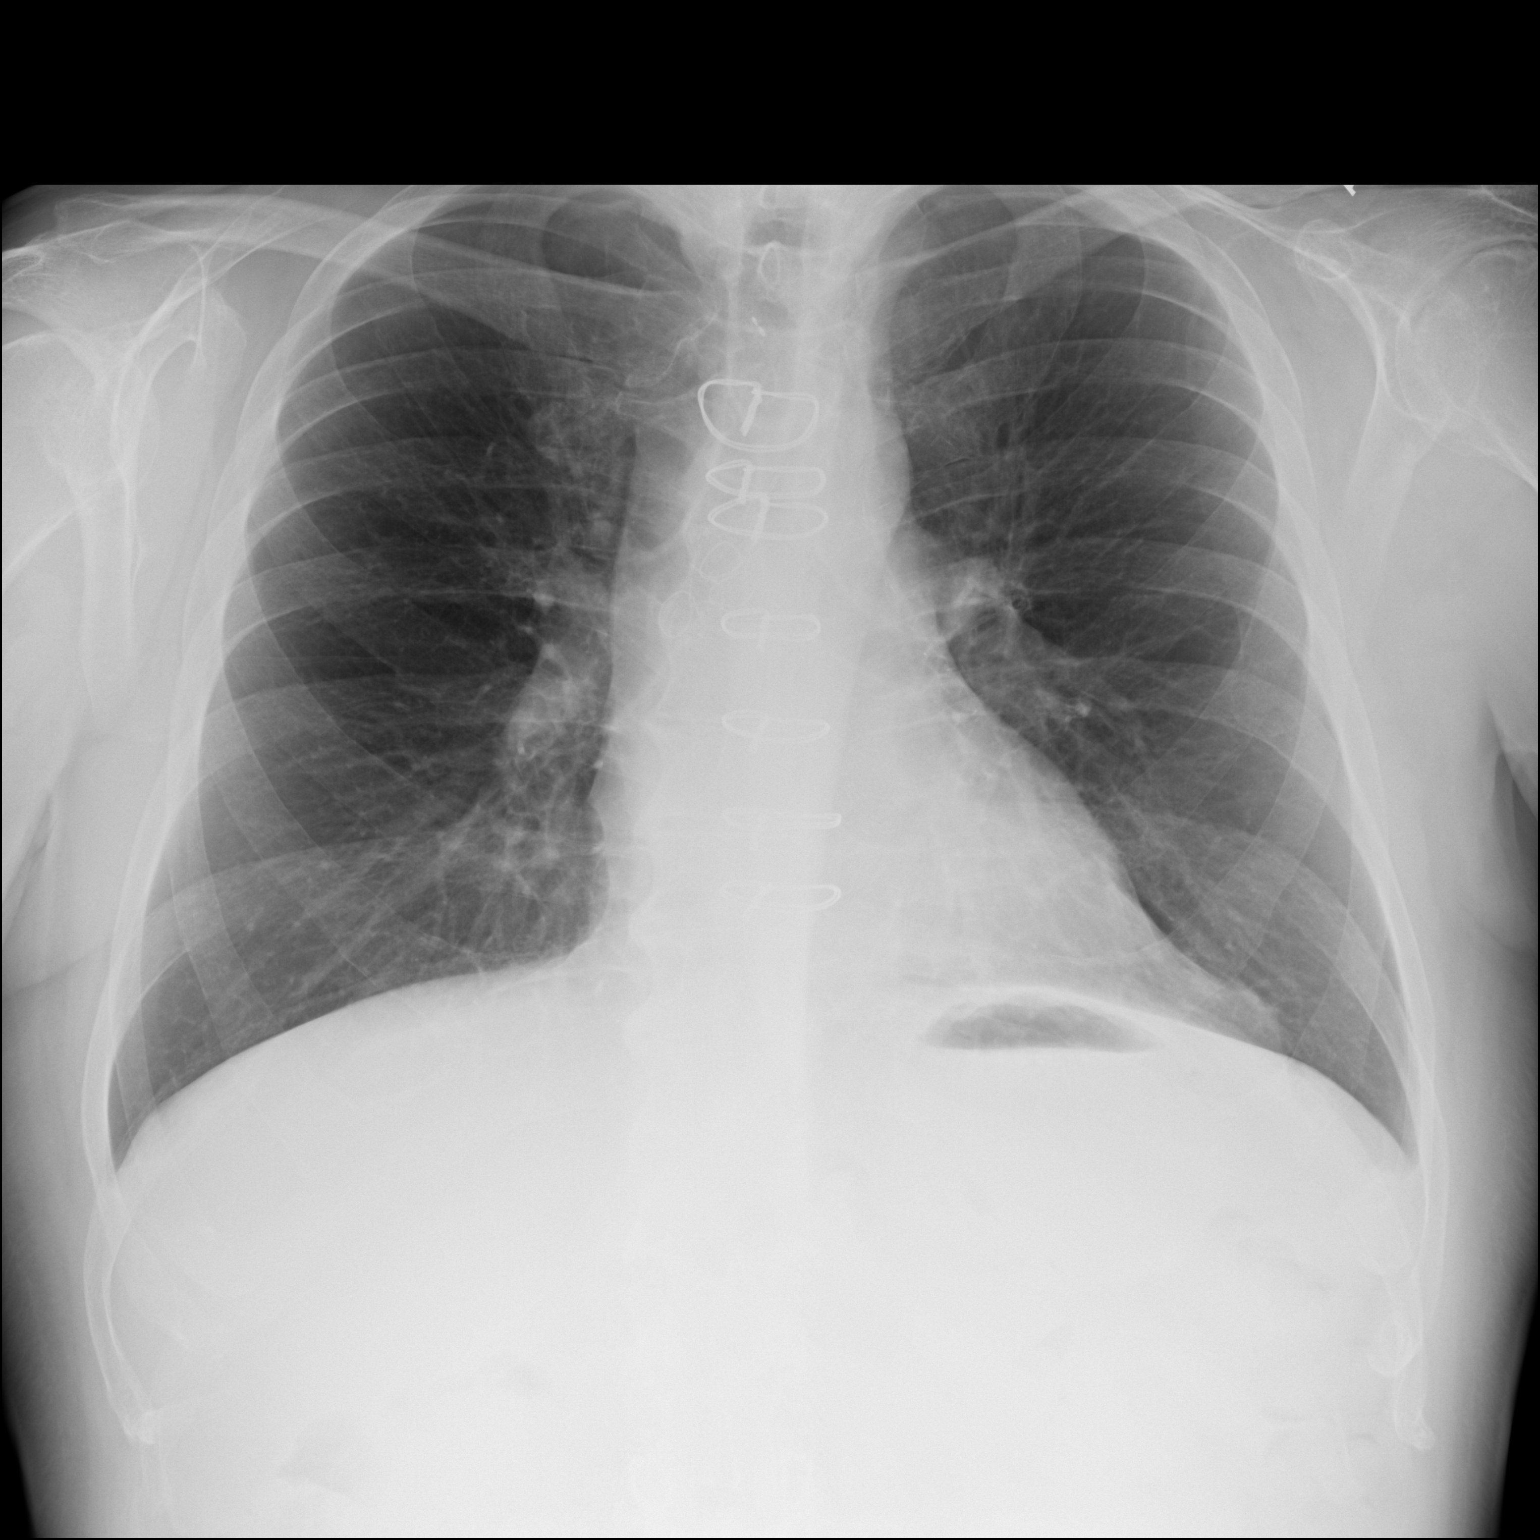

[dg chest 2 view (2 of 2)]
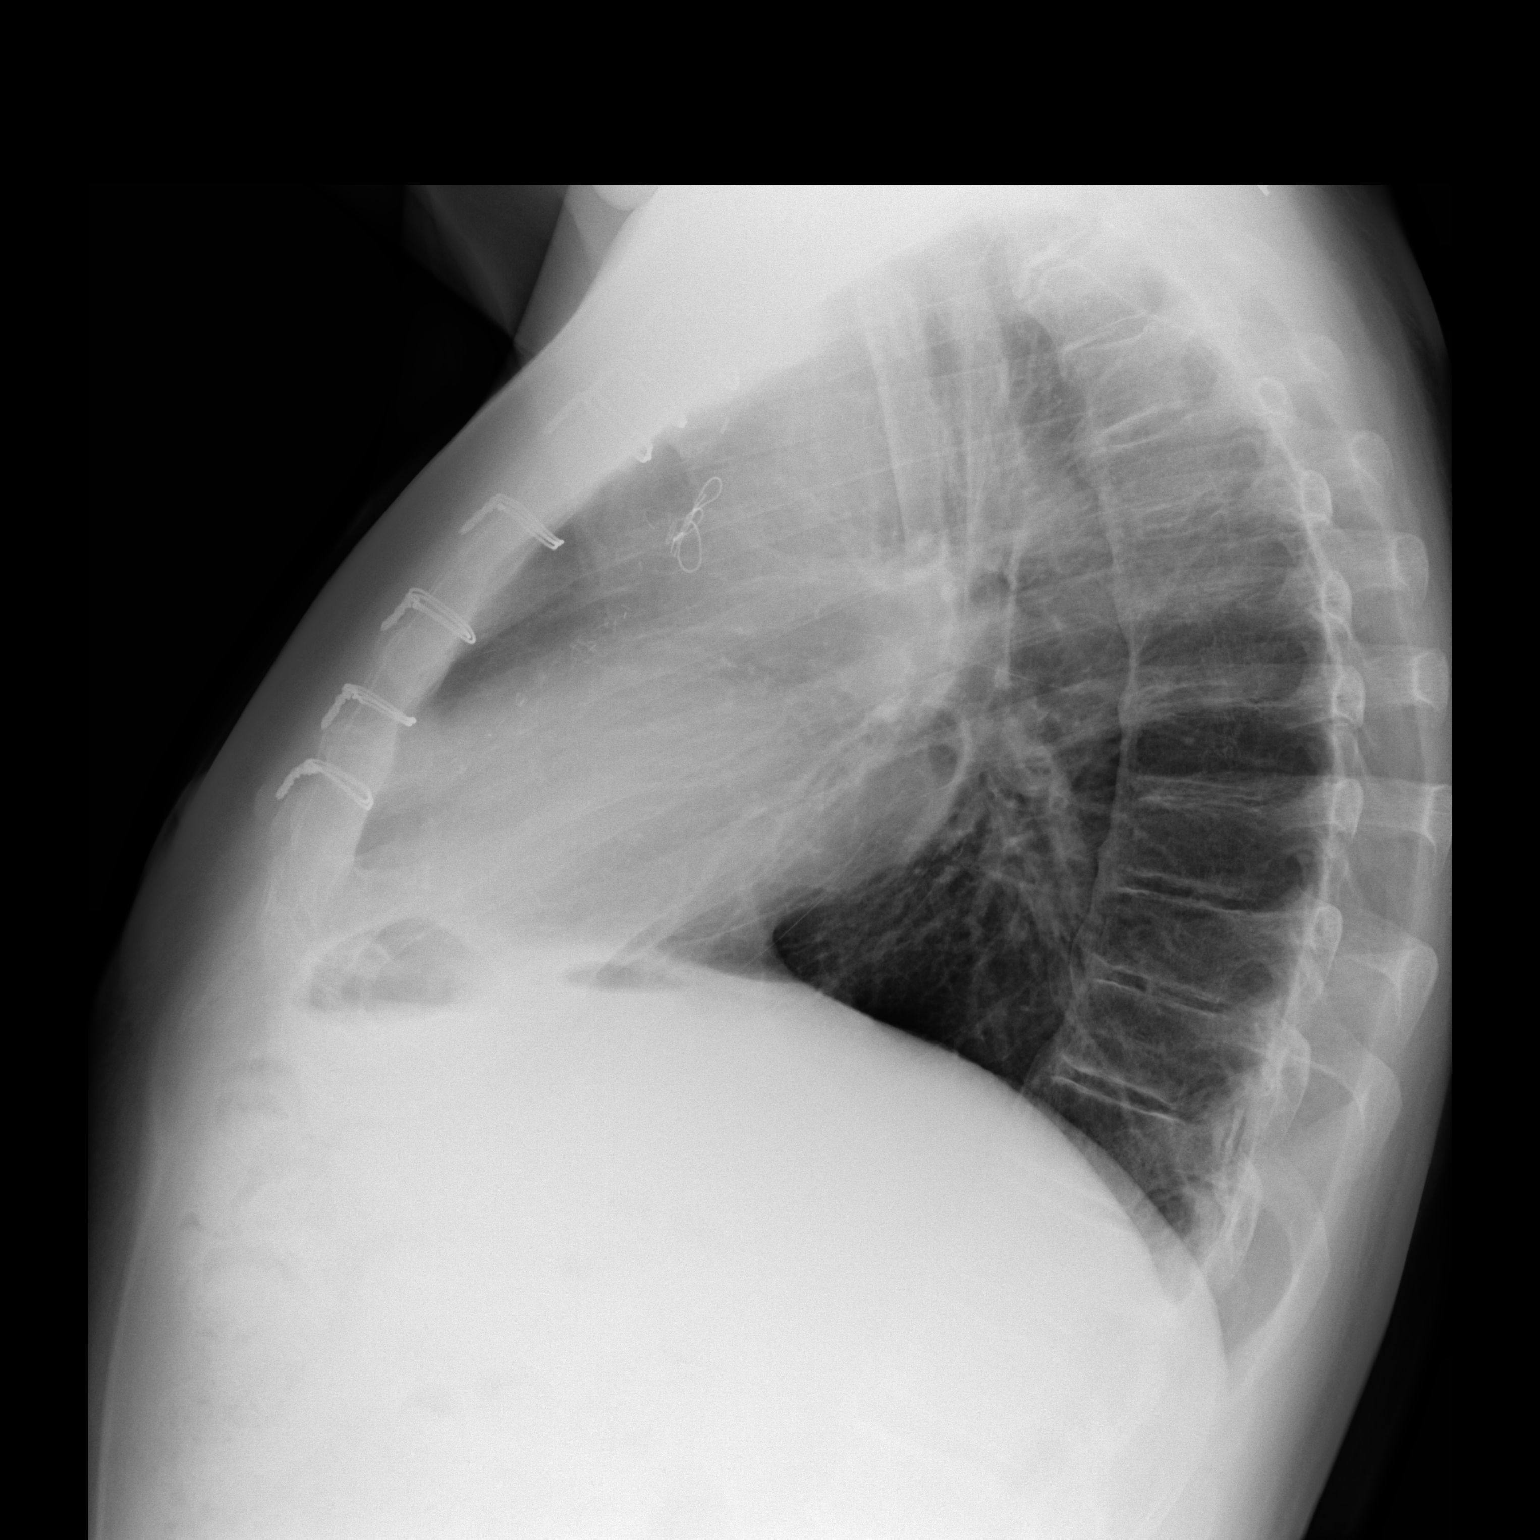

[2 of 2 positions shown; findings below may reference images not displayed]

FINDINGS: Heart size and mediastinal contours are within normal limits.
Cardiac surgical changes and median sternotomy wires. No suspicious
pulmonary opacities identified.

No pleural effusion or pneumothorax visualized.

No acute osseous abnormality appreciated.
IMPRESSION: No acute intrathoracic process identified.

## 2023-12-30 ENCOUNTER — Other Ambulatory Visit (HOSPITAL_COMMUNITY): Payer: Self-pay | Admitting: Family Medicine

## 2024-03-19 ENCOUNTER — Other Ambulatory Visit: Payer: Self-pay | Admitting: Internal Medicine

## 2024-10-11 ENCOUNTER — Encounter (HOSPITAL_COMMUNITY): Payer: Self-pay | Admitting: Internal Medicine

## 2024-10-11 ENCOUNTER — Observation Stay (HOSPITAL_COMMUNITY)
Admission: EM | Admit: 2024-10-11 | Discharge: 2024-10-13 | Disposition: A | Discharge status: Home / Self Care | Attending: Internal Medicine | Admitting: Internal Medicine

## 2024-10-11 ENCOUNTER — Emergency Department (HOSPITAL_COMMUNITY)

## 2024-10-11 ENCOUNTER — Other Ambulatory Visit: Payer: Self-pay

## 2024-10-11 DIAGNOSIS — Z7982 Long term (current) use of aspirin: Secondary | ICD-10-CM | POA: Insufficient documentation

## 2024-10-11 DIAGNOSIS — I10 Essential (primary) hypertension: Secondary | ICD-10-CM | POA: Diagnosis present

## 2024-10-11 DIAGNOSIS — Z6831 Body mass index (BMI) 31.0-31.9, adult: Secondary | ICD-10-CM | POA: Insufficient documentation

## 2024-10-11 DIAGNOSIS — Z7984 Long term (current) use of oral hypoglycemic drugs: Secondary | ICD-10-CM | POA: Insufficient documentation

## 2024-10-11 DIAGNOSIS — E66811 Obesity, class 1: Secondary | ICD-10-CM | POA: Diagnosis not present

## 2024-10-11 DIAGNOSIS — J09X1 Influenza due to identified novel influenza A virus with pneumonia: Secondary | ICD-10-CM | POA: Diagnosis present

## 2024-10-11 DIAGNOSIS — E871 Hypo-osmolality and hyponatremia: Secondary | ICD-10-CM | POA: Diagnosis not present

## 2024-10-11 DIAGNOSIS — E8729 Other acidosis: Secondary | ICD-10-CM | POA: Diagnosis not present

## 2024-10-11 DIAGNOSIS — R531 Weakness: Secondary | ICD-10-CM | POA: Diagnosis present

## 2024-10-11 DIAGNOSIS — I5042 Chronic combined systolic (congestive) and diastolic (congestive) heart failure: Secondary | ICD-10-CM | POA: Insufficient documentation

## 2024-10-11 DIAGNOSIS — R6510 Systemic inflammatory response syndrome (SIRS) of non-infectious origin without acute organ dysfunction: Secondary | ICD-10-CM | POA: Diagnosis not present

## 2024-10-11 DIAGNOSIS — N189 Chronic kidney disease, unspecified: Secondary | ICD-10-CM | POA: Diagnosis not present

## 2024-10-11 DIAGNOSIS — I160 Hypertensive urgency: Secondary | ICD-10-CM | POA: Insufficient documentation

## 2024-10-11 DIAGNOSIS — N1831 Chronic kidney disease, stage 3a: Secondary | ICD-10-CM | POA: Insufficient documentation

## 2024-10-11 DIAGNOSIS — E785 Hyperlipidemia, unspecified: Secondary | ICD-10-CM | POA: Insufficient documentation

## 2024-10-11 DIAGNOSIS — R6520 Severe sepsis without septic shock: Secondary | ICD-10-CM | POA: Diagnosis not present

## 2024-10-11 DIAGNOSIS — E1140 Type 2 diabetes mellitus with diabetic neuropathy, unspecified: Secondary | ICD-10-CM | POA: Insufficient documentation

## 2024-10-11 DIAGNOSIS — E119 Type 2 diabetes mellitus without complications: Secondary | ICD-10-CM

## 2024-10-11 DIAGNOSIS — I130 Hypertensive heart and chronic kidney disease with heart failure and stage 1 through stage 4 chronic kidney disease, or unspecified chronic kidney disease: Secondary | ICD-10-CM | POA: Insufficient documentation

## 2024-10-11 DIAGNOSIS — Z951 Presence of aortocoronary bypass graft: Secondary | ICD-10-CM | POA: Diagnosis not present

## 2024-10-11 DIAGNOSIS — E1122 Type 2 diabetes mellitus with diabetic chronic kidney disease: Secondary | ICD-10-CM | POA: Insufficient documentation

## 2024-10-11 DIAGNOSIS — E860 Dehydration: Principal | ICD-10-CM | POA: Insufficient documentation

## 2024-10-11 DIAGNOSIS — J101 Influenza due to other identified influenza virus with other respiratory manifestations: Principal | ICD-10-CM

## 2024-10-11 DIAGNOSIS — E872 Acidosis, unspecified: Secondary | ICD-10-CM | POA: Insufficient documentation

## 2024-10-11 DIAGNOSIS — I2510 Atherosclerotic heart disease of native coronary artery without angina pectoris: Secondary | ICD-10-CM | POA: Insufficient documentation

## 2024-10-11 DIAGNOSIS — I255 Ischemic cardiomyopathy: Secondary | ICD-10-CM | POA: Insufficient documentation

## 2024-10-11 DIAGNOSIS — N179 Acute kidney failure, unspecified: Secondary | ICD-10-CM | POA: Insufficient documentation

## 2024-10-11 DIAGNOSIS — J1000 Influenza due to other identified influenza virus with unspecified type of pneumonia: Secondary | ICD-10-CM | POA: Insufficient documentation

## 2024-10-11 LAB — CBC
HCT: 51.2 % (ref 39.0–52.0)
Hemoglobin: 17.5 g/dL — ABNORMAL HIGH (ref 13.0–17.0)
MCH: 30.4 pg (ref 26.0–34.0)
MCHC: 34.2 g/dL (ref 30.0–36.0)
MCV: 88.9 fL (ref 80.0–100.0)
Platelets: 152 K/uL (ref 150–400)
RBC: 5.76 MIL/uL (ref 4.22–5.81)
RDW: 13.4 % (ref 11.5–15.5)
WBC: 7.9 K/uL (ref 4.0–10.5)
nRBC: 0 % (ref 0.0–0.2)

## 2024-10-11 LAB — COMPREHENSIVE METABOLIC PANEL WITH GFR
ALT: 22 U/L (ref 0–44)
AST: 24 U/L (ref 15–41)
Albumin: 3.9 g/dL (ref 3.5–5.0)
Alkaline Phosphatase: 76 U/L (ref 38–126)
Anion gap: 24 — ABNORMAL HIGH (ref 5–15)
BUN: 31 mg/dL — ABNORMAL HIGH (ref 8–23)
CO2: 18 mmol/L — ABNORMAL LOW (ref 22–32)
Calcium: 9.2 mg/dL (ref 8.9–10.3)
Chloride: 93 mmol/L — ABNORMAL LOW (ref 98–111)
Creatinine, Ser: 1.54 mg/dL — ABNORMAL HIGH (ref 0.61–1.24)
GFR, Estimated: 51 mL/min — ABNORMAL LOW
Glucose, Bld: 169 mg/dL — ABNORMAL HIGH (ref 70–99)
Potassium: 4.4 mmol/L (ref 3.5–5.1)
Sodium: 136 mmol/L (ref 135–145)
Total Bilirubin: 0.7 mg/dL (ref 0.0–1.2)
Total Protein: 7 g/dL (ref 6.5–8.1)

## 2024-10-11 LAB — PHOSPHORUS: Phosphorus: 4.2 mg/dL (ref 2.5–4.6)

## 2024-10-11 LAB — BASIC METABOLIC PANEL WITH GFR
Anion gap: 22 — ABNORMAL HIGH (ref 5–15)
BUN: 34 mg/dL — ABNORMAL HIGH (ref 8–23)
CO2: 16 mmol/L — ABNORMAL LOW (ref 22–32)
Calcium: 8.6 mg/dL — ABNORMAL LOW (ref 8.9–10.3)
Chloride: 97 mmol/L — ABNORMAL LOW (ref 98–111)
Creatinine, Ser: 1.42 mg/dL — ABNORMAL HIGH (ref 0.61–1.24)
GFR, Estimated: 56 mL/min — ABNORMAL LOW
Glucose, Bld: 188 mg/dL — ABNORMAL HIGH (ref 70–99)
Potassium: 4.2 mmol/L (ref 3.5–5.1)
Sodium: 135 mmol/L (ref 135–145)

## 2024-10-11 LAB — GLUCOSE, CAPILLARY: Glucose-Capillary: 203 mg/dL — ABNORMAL HIGH (ref 70–99)

## 2024-10-11 LAB — BLOOD GAS, VENOUS
Acid-base deficit: 5 mmol/L — ABNORMAL HIGH (ref 0.0–2.0)
Bicarbonate: 19.9 mmol/L — ABNORMAL LOW (ref 20.0–28.0)
O2 Saturation: 86.3 %
Patient temperature: 37.8
pCO2, Ven: 37 mmHg — ABNORMAL LOW (ref 44–60)
pH, Ven: 7.34 (ref 7.25–7.43)
pO2, Ven: 56 mmHg — ABNORMAL HIGH (ref 32–45)

## 2024-10-11 LAB — HEMOGLOBIN A1C
Hgb A1c MFr Bld: 7 % — ABNORMAL HIGH (ref 4.8–5.6)
Mean Plasma Glucose: 154.2 mg/dL

## 2024-10-11 LAB — I-STAT CG4 LACTIC ACID, ED
Lactic Acid, Venous: 2.3 mmol/L (ref 0.5–1.9)
Lactic Acid, Venous: 1.9 mmol/L (ref 0.5–1.9)

## 2024-10-11 LAB — LIPASE, BLOOD: Lipase: 29 U/L (ref 11–51)

## 2024-10-11 LAB — CK: Total CK: 30 U/L — ABNORMAL LOW (ref 49–397)

## 2024-10-11 LAB — PROTIME-INR
INR: 1 (ref 0.8–1.2)
Prothrombin Time: 13.6 s (ref 11.4–15.2)

## 2024-10-11 LAB — PROCALCITONIN: Procalcitonin: 0.73 ng/mL

## 2024-10-11 LAB — OSMOLALITY: Osmolality: 303 mosm/kg — ABNORMAL HIGH (ref 275–295)

## 2024-10-11 LAB — LACTIC ACID, PLASMA: Lactic Acid, Venous: 1.7 mmol/L (ref 0.5–1.9)

## 2024-10-11 LAB — HIV ANTIBODY (ROUTINE TESTING W REFLEX): HIV Screen 4th Generation wRfx: NONREACTIVE

## 2024-10-11 LAB — RESP PANEL BY RT-PCR (RSV, FLU A&B, COVID)  RVPGX2
Influenza A by PCR: POSITIVE — AB
Influenza B by PCR: NEGATIVE
Resp Syncytial Virus by PCR: NEGATIVE
SARS Coronavirus 2 by RT PCR: NEGATIVE

## 2024-10-11 LAB — MAGNESIUM: Magnesium: 2.2 mg/dL (ref 1.7–2.4)

## 2024-10-11 MED ORDER — ONDANSETRON HCL 4 MG PO TABS: 4.0000 mg | ORAL_TABLET | Freq: Four times a day (QID) | ORAL | Status: DC | PRN

## 2024-10-11 MED ORDER — ONDANSETRON HCL 4 MG/2ML IJ SOLN
4.0000 mg | Freq: Once | INTRAMUSCULAR | Status: AC
  Administered 2024-10-11: 4 mg via INTRAVENOUS
  Filled 2024-10-11: qty 2

## 2024-10-11 MED ORDER — SODIUM CHLORIDE 0.9 % IV BOLUS
500.0000 mL | Freq: Once | INTRAVENOUS | Status: AC
  Administered 2024-10-11: 500 mL via INTRAVENOUS

## 2024-10-11 MED ORDER — GABAPENTIN 300 MG PO CAPS
900.0000 mg | ORAL_CAPSULE | Freq: Every day | ORAL | Status: DC
  Administered 2024-10-11 – 2024-10-12 (×2): 900 mg via ORAL
  Filled 2024-10-11 (×2): qty 3

## 2024-10-11 MED ORDER — INSULIN ASPART 100 UNIT/ML IJ SOLN
0.0000 [IU] | INTRAMUSCULAR | Status: DC
  Administered 2024-10-11: 3 [IU] via SUBCUTANEOUS
  Administered 2024-10-12: 2 [IU] via SUBCUTANEOUS
  Administered 2024-10-12: 1 [IU] via SUBCUTANEOUS
  Filled 2024-10-11: qty 1
  Filled 2024-10-11: qty 2
  Filled 2024-10-11: qty 1
  Filled 2024-10-11: qty 3

## 2024-10-11 MED ORDER — FENTANYL CITRATE (PF) 50 MCG/ML IJ SOSY: 12.5000 ug | PREFILLED_SYRINGE | INTRAMUSCULAR | Status: DC | PRN

## 2024-10-11 MED ORDER — SODIUM CHLORIDE 0.9 % IV SOLN
500.0000 mg | INTRAVENOUS | Status: DC
  Administered 2024-10-11 – 2024-10-12 (×2): 500 mg via INTRAVENOUS
  Filled 2024-10-11 (×2): qty 5

## 2024-10-11 MED ORDER — GUAIFENESIN ER 600 MG PO TB12
600.0000 mg | ORAL_TABLET | Freq: Two times a day (BID) | ORAL | Status: DC
  Administered 2024-10-11 – 2024-10-13 (×4): 600 mg via ORAL
  Filled 2024-10-11 (×4): qty 1

## 2024-10-11 MED ORDER — ONDANSETRON HCL 4 MG/2ML IJ SOLN
4.0000 mg | Freq: Once | INTRAMUSCULAR | Status: AC | PRN
  Administered 2024-10-11: 4 mg via INTRAVENOUS
  Filled 2024-10-11: qty 2

## 2024-10-11 MED ORDER — THIAMINE HCL 100 MG/ML IJ SOLN
100.0000 mg | Freq: Every day | INTRAMUSCULAR | Status: DC
  Administered 2024-10-11 – 2024-10-12 (×2): 100 mg via INTRAVENOUS
  Filled 2024-10-11 (×2): qty 2

## 2024-10-11 MED ORDER — ALBUTEROL SULFATE (2.5 MG/3ML) 0.083% IN NEBU: 2.5000 mg | INHALATION_SOLUTION | RESPIRATORY_TRACT | Status: DC | PRN

## 2024-10-11 MED ORDER — SODIUM CHLORIDE 0.9 % IV SOLN
2.0000 g | INTRAVENOUS | Status: DC
  Administered 2024-10-11 – 2024-10-12 (×2): 2 g via INTRAVENOUS
  Filled 2024-10-11 (×2): qty 20

## 2024-10-11 MED ORDER — ACETAMINOPHEN 650 MG RE SUPP: 650.0000 mg | Freq: Four times a day (QID) | RECTAL | Status: DC | PRN

## 2024-10-11 MED ORDER — ACETAMINOPHEN 325 MG PO TABS
650.0000 mg | ORAL_TABLET | Freq: Four times a day (QID) | ORAL | Status: DC | PRN
  Administered 2024-10-12: 650 mg via ORAL
  Filled 2024-10-11: qty 2

## 2024-10-11 MED ORDER — OSELTAMIVIR PHOSPHATE 75 MG PO CAPS
75.0000 mg | ORAL_CAPSULE | Freq: Two times a day (BID) | ORAL | Status: DC
  Administered 2024-10-11 – 2024-10-13 (×4): 75 mg via ORAL
  Filled 2024-10-11 (×4): qty 1

## 2024-10-11 MED ORDER — SODIUM CHLORIDE 0.9 % IV SOLN: INTRAVENOUS | Status: DC

## 2024-10-11 MED ORDER — ONDANSETRON HCL 4 MG/2ML IJ SOLN: 4.0000 mg | Freq: Four times a day (QID) | INTRAMUSCULAR | Status: DC | PRN

## 2024-10-11 MED ORDER — HYDROCODONE-ACETAMINOPHEN 5-325 MG PO TABS: 1.0000 | ORAL_TABLET | ORAL | Status: DC | PRN

## 2024-10-11 MED ORDER — CARVEDILOL 3.125 MG PO TABS
3.1250 mg | ORAL_TABLET | Freq: Two times a day (BID) | ORAL | Status: DC
  Administered 2024-10-11 – 2024-10-13 (×4): 3.125 mg via ORAL
  Filled 2024-10-11 (×4): qty 1

## 2024-10-11 NOTE — Assessment & Plan Note (Signed)
 Restrt coreg  3.125 mg po bid

## 2024-10-11 NOTE — ED Notes (Signed)
 Dr. Patsey notified of Lactic result.

## 2024-10-11 NOTE — Assessment & Plan Note (Addendum)
 In the setting of increased lactic acid Evaluate for presence of ketones in the setting of decreased p.o. intake Rehydrate and recheck Obtain VBG

## 2024-10-11 NOTE — Assessment & Plan Note (Addendum)
" -  SIRS criteria met with     tachycardia   ,   fever   RR >20 Today's Vitals   10/11/24 1620 10/11/24 1625 10/11/24 1626  BP: (!) 180/88    Pulse: (!) 108    Resp: 20    Temp: (!) 100.7 F (38.2 C)    TempSrc: Oral    SpO2: 97%    Weight:   95.3 kg  Height:   5' 9 (1.753 m)  PainSc:  6       The recent clinical data is shown below. Vitals:   10/11/24 1620 10/11/24 1626  BP: (!) 180/88   Pulse: (!) 108   Resp: 20   Temp: (!) 100.7 F (38.2 C)   TempSrc: Oral   SpO2: 97%   Weight:  95.3 kg  Height:  5' 9 (1.753 m)    -Most likely source being: pulmonary,  viral,     Patient meeting criteria for Severe sepsis with    evidence of end organ damage/organ dysfunction such as    elevated lactic acid >2     Component Value Date/Time   LATICACIDVEN 1.9 10/11/2024 1843       - Obtain serial lactic acid and procalcitonin level.  - Initiated IV antibiotics in ER:   Will continue  on :rocephin /azithro   - await results of blood and urine culture  - Rehydrate  Intravenous fluids were administered,     7:05 PM  "

## 2024-10-11 NOTE — ED Triage Notes (Signed)
 Pt bib by ems due to N/V, weakness and fatigue over past 2 days. C/o H/A, abd pain, fever last being 102  Hx HTN - hasn't taken medication in past 2 days 650mg  tylenol  with EMS BP 218/110 HR 112 with EMS

## 2024-10-11 NOTE — H&P (Signed)
 "    Brett Walker FMW:[id number] DOB: 09-22-1961 DOA: 10/11/2024     PCP: Seabron Lenis, MD   Outpatient Specialists:   CARDS:Dr. Bensimhon   Patient arrived to ER on 10/11/24 at 1612 Referred by Attending Silvester Ales, MD   Patient coming from:    home Lives alone,    Chief Complaint:   Chief Complaint  Patient presents with   Emesis   Abdominal Pain   Fatigue    HPI: Brett Walker is a 63 y.o. male with medical history significant of   CAD status post CABG in 2022, systolic CHF secondary to ischemic cardiomyopathy, DM2, hypertension, hyperlipidemia, diabetic neuropathy    Presented with    fever chills cough fatigue Has been sick for the pas 2.5 days has went out to lunch with friends and since Friday has been in bed. Denies sick contacts, Reports URI symptoms body aches, cough, fever, started to cough so hard he vomited, hard time keeping po, started to have generalized fatigue and trouble ambulating. He lives at home alone. EMS called Reports has been feeling so poorly he has not been taking his medications for the past couple days EMS found the patient is hypertensive with blood pressure initially 218/110 heart rate 10/27/2010 patient is febrile up to 102 On arrival to emergency department Persistently febrile 100.7 tachycardic up to 108 Tested positive for influenza A Chest x-ray showing possible infiltrate Patient appears to be dehydrated hemoconcentrated lactic acid elevated 2.3 no evidence of hypoxia still feels significantly fatigued       Denies significant ETOH intake   Does not smoke     Regarding pertinent Chronic problems:    Hyperlipidemia - on statins crestor Lipid Panel     Component Value Date/Time   CHOL 182 01/08/2022 1305   TRIG 78 01/08/2022 1305   HDL 63 01/08/2022 1305   CHOLHDL 2.9 01/08/2022 1305   VLDL 16 01/08/2022 1305   LDLCALC 103 (H) 01/08/2022 1305     HTN on    chronic CHF diastolic/systolic/ combined -  last echo  Recent Results (from the past 56199 hours)  ECHOCARDIOGRAM COMPLETE   Collection Time: 01/08/22 11:49 AM  Result Value   S' Lateral 3.60   Area-P 1/2 3.06   AR max vel 2.76   AV Peak grad 5.3   Ao pk vel 1.15   Narrative      ECHOCARDIOGRAM REPORT     IMPRESSIONS    1. Left ventricular ejection fraction, by estimation, is 40 to 45%. The left ventricle has mildly decreased function. The left ventricle demonstrates regional wall motion abnormalities (see scoring diagram/findings for description). There is mild left  ventricular hypertrophy. Left ventricular diastolic parameters are consistent with Grade I diastolic dysfunction (impaired relaxation). There is akinesis of the left ventricular, entire inferoseptal wall, inferior wall and inferolateral wall.  2. Right ventricular systolic function is mildly reduced. The right ventricular size is normal.  3. Left atrial size was moderately dilated.  4. The mitral valve is normal in structure. Trivial mitral valve regurgitation. No evidence of mitral stenosis.  5. Nodular thickening of non-coronary cusp. The aortic valve is tricuspid. There is mild thickening of the aortic valve. Aortic valve regurgitation is not visualized. Aortic valve sclerosis/calcification is present, without any evidence of aortic  stenosis.  6. The inferior vena cava is normal in size with greater than 50% respiratory variability, suggesting right atrial pressure of 3 mmHg.  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         DM 2 -  Lab Results  Component Value Date   HGBA1C 6.6 (H) 10/04/2021   on  PO meds       CKD stage IIIa baseline Cr 1.3 Estimated Creatinine Clearance: 57.4 mL/min (A) (by C-G formula based on SCr of 1.54 mg/dL (H)).  Lab Results  Component Value Date   CREATININE 1.54 (H)  10/11/2024   CREATININE 1.26 (H) 11/14/2022   CREATININE 0.94 11/24/2021   Lab Results  Component Value Date   NA 136 10/11/2024   CL 93 (L) 10/11/2024   K 4.4 10/11/2024   CO2 18 (L) 10/11/2024   BUN 31 (H) 10/11/2024   CREATININE 1.54 (H) 10/11/2024   GFRNONAA 51 (L) 10/11/2024   CALCIUM 9.2 10/11/2024   ALBUMIN  3.9 10/11/2024   GLUCOSE 169 (H) 10/11/2024     While in ER:     Found to be positive for influenza A    Lab Orders         Culture, blood (Routine x 2)         Resp panel by RT-PCR (RSV, Flu A&B, Covid) Anterior Nasal Swab         Lipase, blood         Comprehensive metabolic panel         CBC         Protime-INR         Urinalysis, w/ Reflex to Culture (Infection Suspected) -Urine, Clean Catch         Procalcitonin         Lactic acid, plasma         CK         Magnesium          Phosphorus         I-Stat Lactic Acid, ED       CXR - Patchy airspace disease at the left lung base, suspicious for pneumonia.   Following Medications were ordered in ER: Medications  ondansetron  (ZOFRAN ) injection 4 mg (4 mg Intravenous Given 10/11/24 1633)  sodium chloride  0.9 % bolus 500 mL (0 mLs Intravenous Stopped 10/11/24 1814)  ondansetron  (ZOFRAN ) injection 4 mg (4 mg Intravenous Given 10/11/24 1845)       ED Triage Vitals  Encounter Vitals Group     BP 10/11/24 1620 (!) 180/88     Girls Systolic BP Percentile --      Girls Diastolic BP Percentile --      Boys Systolic BP Percentile --      Boys Diastolic BP Percentile --      Pulse Rate 10/11/24 1620 (!) 108     Resp 10/11/24 1620 20     Temp 10/11/24 1620 (!) 100.7 F (38.2 C)     Temp Source 10/11/24 1620 Oral     SpO2 10/11/24 1620 97 %     Weight 10/11/24 1626 210 lb (95.3 kg)     Height 10/11/24 1626 5' 9 (1.753 m)     Head Circumference --      Peak Flow --      Pain Score 10/11/24 1625 6  Pain Loc --      Pain Education --      Exclude from Growth Chart --   [id number]      _________________________________________ Significant initial  Findings: Abnormal Labs Reviewed  RESP PANEL BY RT-PCR (RSV, FLU A&B, COVID)  RVPGX2 - Abnormal; Notable for the following components:      Result Value   Influenza A by PCR POSITIVE (*)    All other components within normal limits  COMPREHENSIVE METABOLIC PANEL WITH GFR - Abnormal; Notable for the following components:   Chloride 93 (*)    CO2 18 (*)    Glucose, Bld 169 (*)    BUN 31 (*)    Creatinine, Ser 1.54 (*)    GFR, Estimated 51 (*)    Anion gap 24 (*)    All other components within normal limits  CBC - Abnormal; Notable for the following components:   Hemoglobin 17.5 (*)    All other components within normal limits  I-STAT CG4 LACTIC ACID, ED - Abnormal; Notable for the following components:   Lactic Acid, Venous 2.3 (*)    All other components within normal limits    ECG: Ordered  ____________________ This patient meets SIRS Criteria and may be septic.  The recent clinical data is shown below. Vitals:   10/11/24 1620 10/11/24 1626  BP: (!) 180/88   Pulse: (!) 108   Resp: 20   Temp: (!) 100.7 F (38.2 C)   TempSrc: Oral   SpO2: 97%   Weight:  95.3 kg  Height:  5' 9 (1.753 m)    WBC     Component Value Date/Time   WBC 7.9 10/11/2024 1637   LYMPHSABS 1.1 09/26/2021 2216   MONOABS 0.6 09/26/2021 2216   EOSABS 0.2 09/26/2021 2216   BASOSABS 0.1 09/26/2021 2216        Lactic Acid, Venous    Component Value Date/Time   LATICACIDVEN 2.3 (HH) 10/11/2024 1646     Lactic Acid, Venous    Component Value Date/Time   LATICACIDVEN 1.9 10/11/2024 1843    Procalcitonin   Ordered      UA   ordered     Results for orders placed or performed during the hospital encounter of 10/11/24  Resp panel by RT-PCR (RSV, Flu A&B, Covid)     Status: Abnormal   Collection Time: 10/11/24  4:45 PM   Specimen: Nasal Swab  Result Value Ref Range Status   SARS Coronavirus 2 by RT PCR NEGATIVE NEGATIVE Final          Influenza A by PCR POSITIVE (A) NEGATIVE Final   Influenza B by PCR NEGATIVE NEGATIVE Final         Resp Syncytial Virus by PCR NEGATIVE NEGATIVE Final         ______________________________________________ VBG orderd __________________________________________________________ Recent Labs  Lab 10/11/24 1637  NA 136  K 4.4  CO2 18*  GLUCOSE 169*  BUN 31*  CREATININE 1.54*  CALCIUM 9.2    Cr    Up from baseline see below Lab Results  Component Value Date   CREATININE 1.54 (H) 10/11/2024   CREATININE 1.26 (H) 11/14/2022   CREATININE 0.94 11/24/2021    Recent Labs  Lab 10/11/24 1637  AST 24  ALT 22  ALKPHOS 76  BILITOT 0.7  PROT 7.0  ALBUMIN  3.9   Lab Results  Component Value Date   CALCIUM 9.2 10/11/2024    Plt: Lab Results  Component Value Date   PLT  152 10/11/2024    Recent Labs  Lab 10/11/24 1637  WBC 7.9  HGB 17.5*  HCT 51.2  MCV 88.9  PLT 152    HG/HCT * stable,  Down *Up from baseline see below    Component Value Date/Time   HGB 17.5 (H) 10/11/2024 1637   HCT 51.2 10/11/2024 1637   MCV 88.9 10/11/2024 1637     Recent Labs  Lab 10/11/24 1637  LIPASE 29    _______________________________________________ Hospitalist was called for admission for   Influenza A    The following Work up has been ordered so far:  Orders Placed This Encounter  Procedures   Culture, blood (Routine x 2)   Resp panel by RT-PCR (RSV, Flu A&B, Covid) Anterior Nasal Swab   DG Chest Portable 1 View   DG Chest 2 View   Lipase, blood   Comprehensive metabolic panel   CBC   Protime-INR   Urinalysis, w/ Reflex to Culture (Infection Suspected) -Urine, Clean Catch   Procalcitonin   Lactic acid, plasma   CK   Magnesium    Phosphorus   Diet NPO time specified   Notify physician (specify)  Specify: Notify provider for possible Code Sepsis   Document height and weight   Cardiac Monitoring Continuous x 24 hours Indications for use: Other; Other indications  for use: dehydration   Consult to hospitalist   I-Stat Lactic Acid, ED   Place in observation (patient's expected length of stay will be less than 2 midnights)     OTHER Significant initial  Findings:  labs showing:     DM  labs:  HbA1C: No results for input(s): HGBA1C in the last 8760 hours.    CBG (last 3)  No results for input(s): GLUCAP in the last 72 hours.        Cultures: No results found for: SDES, SPECREQUEST, CULT, REPTSTATUS   Radiological Exams on Admission: DG Chest Portable 1 View Result Date: 10/11/2024 CLINICAL DATA:  Cough.  Nausea, vomiting, weakness. EXAM: PORTABLE CHEST 1 VIEW COMPARISON:  11/08/2021 FINDINGS: Post median sternotomy with borderline cardiomegaly. Patchy airspace disease at the left lung base. No pulmonary edema. No pleural effusion or pneumothorax. On limited assessment, no acute osseous findings IMPRESSION: Patchy airspace disease at the left lung base, suspicious for pneumonia. Electronically Signed   By: Andrea Gasman M.D.   On: 10/11/2024 17:33   _______________________________________________________________________________________________ Latest  Blood pressure (!) 180/88, pulse (!) 108, temperature (!) 100.7 F (38.2 C), temperature source Oral, resp. rate 20, height 5' 9 (1.753 m), weight 95.3 kg, SpO2 97%.   Vitals  labs and radiology finding personally reviewed  Review of Systems:    Pertinent positives include:  , Fevers, chills, fatigue, cough Constitutional:  No weight loss, night sweats weight loss  HEENT:  No headaches, Difficulty swallowing,Tooth/dental problems,Sore throat,  No sneezing, itching, ear ache, nasal congestion, post nasal drip,  Cardio-vascular:  No chest pain, Orthopnea, PND, anasarca, dizziness, palpitations.no Bilateral lower extremity swelling  GI:  No heartburn, indigestion, abdominal pain, nausea, vomiting, diarrhea, change in bowel habits, loss of appetite, melena, blood in stool,  hematemesis Resp:  no shortness of breath at rest. No dyspnea on exertion, No excess mucus, no productive cough, No non-productive cough, No coughing up of blood.No change in color of mucus.No wheezing. Skin:  no rash or lesions. No jaundice GU:  no dysuria, change in color of urine, no urgency or frequency. No straining to urinate.  No flank pain.  Musculoskeletal:  No  joint pain or no joint swelling. No decreased range of motion. No back pain.  Psych:  No change in mood or affect. No depression or anxiety. No memory loss.  Neuro: no localizing neurological complaints, no tingling, no weakness, no double vision, no gait abnormality, no slurred speech, no confusion  All systems reviewed and apart from HOPI all are negative _______________________________________________________________________________________________ Past Medical History:   Past Medical History:  Diagnosis Date   Cardiomyopathy (HCC)    CHF (congestive heart failure) (HCC)    Coronary artery disease    Diabetes mellitus without complication (HCC)    Diabetic peripheral neuropathy (HCC)    Diabetic retinopathy (HCC)    Diverticulitis    Dyspnea    Elevated LDL cholesterol level    Family history of adverse reaction to anesthesia    Patient states his dad woke up crazy from anesthesia and had to be restrained   Hypertension    Pharyngoesophageal dysphagia      Past Surgical History:  Procedure Laterality Date   CARDIAC CATHETERIZATION     CORONARY ARTERY BYPASS GRAFT N/A 10/06/2021   Procedure: CORONARY ARTERY BYPASS GRAFTING (CABG) x 5 ON CARDIOPULMONARY BYPASS USING LIMA AND RIGHT GREATER SAPHENOUS VEIN;  Surgeon: Lucas Dorise POUR, MD;  Location: [facility] OR;  Service: Open Heart Surgery;  Laterality: N/A;   ENDOVEIN HARVEST OF GREATER SAPHENOUS VEIN Right 10/06/2021   Procedure: ENDOVEIN HARVEST OF GREATER SAPHENOUS VEIN;  Surgeon: Lucas Dorise POUR, MD;  Location: [facility] OR;  Service: Open Heart Surgery;  Laterality:  Right;   EYE SURGERY     RIGHT/LEFT HEART CATH AND CORONARY ANGIOGRAPHY N/A 09/29/2021   Procedure: RIGHT/LEFT HEART CATH AND CORONARY ANGIOGRAPHY;  Surgeon: Cherrie Toribio SAUNDERS, MD;  Location: [facility] INVASIVE CV LAB;  Service: Cardiovascular;  Laterality: N/A;   TEE WITHOUT CARDIOVERSION N/A 10/06/2021   Procedure: TRANSESOPHAGEAL ECHOCARDIOGRAM (TEE);  Surgeon: Lucas Dorise POUR, MD;  Location: [facility];  Service: Open Heart Surgery;  Laterality: N/A;   WISDOM TOOTH EXTRACTION      Social History:  Ambulatory   independently     reports that he has never smoked. He has never used smokeless tobacco. He reports that he does not currently use alcohol. He reports that he does not use drugs.   Family History:   Family History  Problem Relation Age of Onset   Parkinson's disease Father    CAD Father    Hydrocephalus Brother    CAD Paternal Grandfather    ______________________________________________________________________________________________ Allergies: Allergies[1]   Prior to Admission medications  Medication Sig Start Date End Date Taking? Authorizing Provider  aspirin  EC 81 MG tablet Take 1 tablet (81 mg total) by mouth daily. Swallow whole. 11/14/22   Glena Harlene HERO, FNP  carvedilol  (COREG ) 3.125 MG tablet TAKE 1 TABLET BY MOUTH TWICE A DAY WITH A MEAL 01/01/24   [place], [place], FNP  cyanocobalamin  (VITAMIN B12) 1000 MCG tablet Take 3,000 mcg by mouth daily.    [provider]  empagliflozin  (JARDIANCE ) 10 MG TABS tablet Take 1 tablet (10 mg total) by mouth daily before breakfast. 12/18/22   Bensimhon, Toribio SAUNDERS, MD  Evolocumab  (REPATHA  SURECLICK) 140 MG/ML SOAJ INJECT 140 MG INTO THE SKIN EVERY 14 (FOURTEEN) DAYS. 03/23/24   Bensimhon, Toribio SAUNDERS, MD  gabapentin  (NEURONTIN ) 300 MG capsule Take 3 capsules (900 mg total) by mouth at bedtime. 11/08/21   Barrett, Erin R, PA-C  KERENDIA 10 MG TABS Take 1 tablet by mouth daily. 09/10/22   [provider]  metFORMIN   (GLUCOPHAGE -XR) 750 MG 24 hr tablet Take 1 tablet (750 mg total) by mouth 2 (two) times daily. 11/08/21   Barrett, Erin R, PA-C  NON FORMULARY Alphalipoicoic acid 600mg  QD    [provider]  pravastatin  (PRAVACHOL ) 40 MG tablet TAKE 1 TABLET BY MOUTH EVERY DAY 01/01/24   [place], Harlene HERO, FNP  sacubitril -valsartan  (ENTRESTO ) 24-26 MG Take 1 tablet by mouth 2 (two) times daily. NEEDS FOLLOW UP APPOINTMENT FOR ANYMORE REFILLS 11/14/22   Glena Harlene HERO, FNP    ___________________________________________________________________________________________________ Physical Exam:    10/11/2024    4:26 PM 10/11/2024    4:20 PM 11/14/2022    3:39 PM  Vitals with BMI  Height 5' 9    Weight 210 lbs  215 lbs 10 oz  BMI 31    Systolic  180 130  Diastolic  88 82  Pulse  108 72     1. General:  in No  Acute distress   Chronically ill   -appearing 2. Psychological: Alert and   Oriented 3. Head/ENT:  Dry Mucous Membranes                          Head Non traumatic, neck supple                         Poor Dentition 4. SKIN:  decreased Skin turgor,  Skin clean Dry and intact no rash    5. Heart: Regular rate and rhythm no  Murmur, no Rub or gallop 6. Lungs:   no wheezes or crackles   7. Abdomen: Soft,  non-tender, Non distended    bowel sounds present 8. Lower extremities: no clubbing, cyanosis, no  edema 9. Neurologically  strength 5 out of 5 in all 4 extremities cranial nerves II through XII intact 10. MSK: Normal range of motion    Chart has been reviewed  ______________________________________________________________________________________________  Assessment/Plan 63 y.o. male with medical history significant of   CAD status post CABG in 2022, systolic CHF secondary to ischemic cardiomyopathy, DM2, hypertension, hyperlipidemia, diabetic neuropathy   Admitted for   Influenza A dehydration and possible PNA     Present on Admission:  Dehydration  3-vessel coronary artery  disease  Essential hypertension  Hyperlipidemia  Influenza A with pneumonia  SIRS (systemic inflammatory response syndrome) (HCC)  Ischemic cardiomyopathy  Acute kidney injury superimposed on CKD  Metabolic acidosis, increased anion gap     3-vessel coronary artery disease Check CK until then hold statin Resume Coreg  3.125 mg p.o. twice daily   Dehydration Rehydrate and monitor fluid status given history of CHF  Essential hypertension Restrt coreg  3.125 mg po bid  Hyperlipidemia Hold statin until CK is back  DM (diabetes mellitus), type 2 (HCC) Order SSI hold PO meds   Influenza A with pneumonia CXR with possible PNA will obtian 2V to confirm and pro calcitonin  Given risk factors and pt only recently showing symptoms will start on Tamiflu     SIRS (systemic inflammatory response syndrome) (HCC)  -SIRS criteria met with     tachycardia   ,   fever   RR >20 Today's Vitals   10/11/24 1620 10/11/24 1625 10/11/24 1626  BP: (!) 180/88    Pulse: (!) 108    Resp: 20    Temp: (!) 100.7 F (38.2 C)    TempSrc: Oral    SpO2: 97%    Weight:  95.3 kg  Height:   5' 9 (1.753 m)  PainSc:  6       The recent clinical data is shown below. Vitals:   10/11/24 1620 10/11/24 1626  BP: (!) 180/88   Pulse: (!) 108   Resp: 20   Temp: (!) 100.7 F (38.2 C)   TempSrc: Oral   SpO2: 97%   Weight:  95.3 kg  Height:  5' 9 (1.753 m)    -Most likely source being: pulmonary,  viral,     Patient meeting criteria for Severe sepsis with    evidence of end organ damage/organ dysfunction such as    elevated lactic acid >2     Component Value Date/Time   LATICACIDVEN 1.9 10/11/2024 1843       - Obtain serial lactic acid and procalcitonin level.  - Initiated IV antibiotics in ER:   Will continue  on :rocephin /azithro   - await results of blood and urine culture  - Rehydrate  Intravenous fluids were administered,     7:05 PM   Ischemic cardiomyopathy Currently  appears to be on a dry side hold Entresto  rehydrate for fluid status  Acute kidney injury superimposed on CKD  -chronic avoid nephrotoxic medications such as NSAIDs, Vanco Zosyn combo,  avoid hypotension, continue to follow renal function Rehydrate , obtain urine elctrolytes  Metabolic acidosis, increased anion gap In the setting of increased lactic acid Evaluate for presence of ketones in the setting of decreased p.o. intake Rehydrate and recheck Obtain VBG  Possible pneumonia will obtain repeat imaging Check procalcitonin For tonight continue Rocephin  azithromycin  If procalcitonin unremarkable and repeat imaging showing no evidence of pneumonia would de-escalate   Other plan as per orders.  DVT prophylaxis:  SCD     Code Status:    Code Status: Prior FULL CODE  as per patient   I had personally discussed CODE STATUS with patient  ACP   none    Family Communication:   Family not at  Bedside  father, mother  Diet diabetic heart healthy   Disposition Plan:        To home once workup is complete and patient is stable   Following barriers for discharge:                                                          Electrolytes corrected  Afebrile,                                    Consult Orders  (From admission, onward)           Start     Ordered   10/11/24 1740  Consult to hospitalist  Once       Provider:  (Not yet assigned)  Question Answer Comment  Place call to: [facility]   Reason for Consult Admit      10/11/24 1739                               Would benefit from PT/OT eval prior to DC  Ordered  Consults called:    NONE  Admission status:  ED Disposition     ED Disposition  Admit   Condition  --   Comment  Hospital Area: [facility] [[id number]]  Level of Care: Telemetry [5]  Admit to tele based on following criteria: Other see comments  Comments: tachypnea  May place patient in observation at [facility] or [facility]  if equivalent level of care is available:: No  Diagnosis: Dehydration [276.51.ICD-9-CM]  Admitting Physician: Jazz Rogala [[id number]]  Attending Physician: Heath Badon [[id number]]          Obs     Level of care     tele  For  24H       Sharryn Belding 10/11/2024, 7:55 PM    [facility]     after 2 AM please page floor coverage   If 7AM-7PM, please contact the day team taking care of the patient using [email or web address]        [1]  Allergies Allergen Reactions   Penicillin G Hives   Atorvastatin Diarrhea and Other (See Comments)    Fecal urgency   Lipitor [Atorvastatin Calcium] Diarrhea   Ozempic (0.25 Or 0.5 Mg-Dose) [Semaglutide(0.25 Or 0.5mg -Dos)] Diarrhea   Rosuvastatin Diarrhea and Other (See Comments)    GI cramping, too   Semaglutide Diarrhea   "

## 2024-10-11 NOTE — ED Notes (Signed)
" °   10/11/24 1950  Hand-off documentation  Hand-off Received Ready to receive patient from the ED  Report received from (Full Name) Chart Review    "

## 2024-10-11 NOTE — ED Provider Notes (Signed)
 " [facility] AT [facility] Provider Note   CSN: [id number] Arrival date & time: 10/11/24  1612     Patient presents with: Emesis, Abdominal Pain, and Fatigue   Brett Walker is a 63 y.o. male.    Emesis Associated symptoms: abdominal pain   Abdominal Pain Associated symptoms: vomiting   Patient with 2 days of feeling bad.  Began Friday afternoon.  Has had chills and fevers.  Feeling better.  Now developed some vomiting.  States he has really not had his medicines in the last 2 days.  States just felt bad.  No definite sick contacts.    Past Medical History:  Diagnosis Date   Cardiomyopathy (HCC)    CHF (congestive heart failure) (HCC)    Coronary artery disease    Diabetes mellitus without complication (HCC)    Diabetic peripheral neuropathy (HCC)    Diabetic retinopathy (HCC)    Diverticulitis    Dyspnea    Elevated LDL cholesterol level    Family history of adverse reaction to anesthesia    Patient states his dad woke up crazy from anesthesia and had to be restrained   Hypertension    Pharyngoesophageal dysphagia     Prior to Admission medications  Medication Sig Start Date End Date Taking? Authorizing Provider  aspirin  EC 81 MG tablet Take 1 tablet (81 mg total) by mouth daily. Swallow whole. 11/14/22   Glena Harlene HERO, FNP  carvedilol  (COREG ) 3.125 MG tablet TAKE 1 TABLET BY MOUTH TWICE A DAY WITH A MEAL 01/01/24   [place], [place], FNP  cyanocobalamin  (VITAMIN B12) 1000 MCG tablet Take 3,000 mcg by mouth daily.    [provider]  empagliflozin  (JARDIANCE ) 10 MG TABS tablet Take 1 tablet (10 mg total) by mouth daily before breakfast. 12/18/22   Bensimhon, Toribio SAUNDERS, MD  Evolocumab  (REPATHA  SURECLICK) 140 MG/ML SOAJ INJECT 140 MG INTO THE SKIN EVERY 14 (FOURTEEN) DAYS. 03/23/24   Bensimhon, Toribio SAUNDERS, MD  gabapentin  (NEURONTIN ) 300 MG capsule Take 3 capsules (900 mg total) by mouth at bedtime. 11/08/21   Barrett, Erin R,  PA-C  KERENDIA 10 MG TABS Take 1 tablet by mouth daily. 09/10/22   [provider]  metFORMIN  (GLUCOPHAGE -XR) 750 MG 24 hr tablet Take 1 tablet (750 mg total) by mouth 2 (two) times daily. 11/08/21   Barrett, Erin R, PA-C  NON FORMULARY Alphalipoicoic acid 600mg  QD    [provider]  pravastatin  (PRAVACHOL ) 40 MG tablet TAKE 1 TABLET BY MOUTH EVERY DAY 01/01/24   Milford, Harlene HERO, FNP  sacubitril -valsartan  (ENTRESTO ) 24-26 MG Take 1 tablet by mouth 2 (two) times daily. NEEDS FOLLOW UP APPOINTMENT FOR ANYMORE REFILLS 11/14/22   Glena Harlene HERO, FNP    Allergies: Penicillin g, Lipitor [atorvastatin calcium], Other, Ozempic (0.25 or 0.5 mg-dose) [semaglutide(0.25 or 0.5mg -dos)], and Rosuvastatin    Review of Systems  Gastrointestinal:  Positive for abdominal pain and vomiting.    Updated Vital Signs BP (!) 180/88 (BP Location: Right Arm)   Pulse (!) 108   Temp (!) 100.7 F (38.2 C) (Oral)   Resp 20   Ht 5' 9 (1.753 m)   Wt 95.3 kg   SpO2 97%   BMI 31.01 kg/m   Physical Exam Vitals and nursing note reviewed.  HENT:     Head: Atraumatic.  Cardiovascular:     Rate and Rhythm: Tachycardia present.  Pulmonary:     Breath sounds: No wheezing or rhonchi.  Abdominal:     Tenderness: There is no abdominal tenderness.     Hernia: No hernia is present.  Skin:    General: Skin is warm.  Neurological:     Mental Status: He is alert.     (all labs ordered are listed, but only abnormal results are displayed) Labs Reviewed  RESP PANEL BY RT-PCR (RSV, FLU A&B, COVID)  RVPGX2 - Abnormal; Notable for the following components:      Result Value   Influenza A by PCR POSITIVE (*)    All other components within normal limits  COMPREHENSIVE METABOLIC PANEL WITH GFR - Abnormal; Notable for the following components:   Chloride 93 (*)    CO2 18 (*)    Glucose, Bld 169 (*)    BUN 31 (*)    Creatinine, Ser 1.54 (*)    GFR, Estimated 51 (*)    Anion gap 24 (*)    All  other components within normal limits  CBC - Abnormal; Notable for the following components:   Hemoglobin 17.5 (*)    All other components within normal limits  I-STAT CG4 LACTIC ACID, ED - Abnormal; Notable for the following components:   Lactic Acid, Venous 2.3 (*)    All other components within normal limits  CULTURE, BLOOD (ROUTINE X 2)  CULTURE, BLOOD (ROUTINE X 2)  LIPASE, BLOOD  PROTIME-INR  URINALYSIS, W/ REFLEX TO CULTURE (INFECTION SUSPECTED)    EKG: None  Radiology: DG Chest Portable 1 View Result Date: 10/11/2024 CLINICAL DATA:  Cough.  Nausea, vomiting, weakness. EXAM: PORTABLE CHEST 1 VIEW COMPARISON:  11/08/2021 FINDINGS: Post median sternotomy with borderline cardiomegaly. Patchy airspace disease at the left lung base. No pulmonary edema. No pleural effusion or pneumothorax. On limited assessment, no acute osseous findings IMPRESSION: Patchy airspace disease at the left lung base, suspicious for pneumonia. Electronically Signed   By: Andrea Gasman M.D.   On: 10/11/2024 17:33     Procedures   Medications Ordered in the ED  ondansetron  (ZOFRAN ) injection 4 mg (4 mg Intravenous Given 10/11/24 1633)  sodium chloride  0.9 % bolus 500 mL (500 mLs Intravenous New Bag/Given 10/11/24 1638)                                    Medical Decision Making Amount and/or Complexity of Data Reviewed Labs: ordered. Radiology: ordered.  Risk Prescription drug management.   Patient with URI symptoms.  Cough with some sputum production.  Also now developed some vomiting.  History of CHF.  Differential diagnosis does include various infections such as viral syndromes but also pneumonia.  Will get x-ray.  Will get blood work.  Will get viral testing.  X-ray does show potential infiltrate but also flu positive.  Flu could be because of the pneumonia.  However has had vomiting and not tolerating orals well.  Feels fatigued.  I feel patient benefit from admission to the hospital.   Will discuss with hospitalist.     Final diagnoses:  Influenza A    ED Discharge Orders     None          Patsey Lot, MD 10/11/24 1741  "

## 2024-10-11 NOTE — Assessment & Plan Note (Signed)
 Order SSI hold PO meds

## 2024-10-11 NOTE — Assessment & Plan Note (Signed)
 Rehydrate and monitor fluid status given history of CHF

## 2024-10-11 NOTE — Progress Notes (Signed)
 Pt arrived to 4E in a wheelchair and ambulatory to the bed. Droplet precautions added.

## 2024-10-11 NOTE — Assessment & Plan Note (Signed)
 Hold statin until CK is back

## 2024-10-11 NOTE — Assessment & Plan Note (Signed)
" -  chronic avoid nephrotoxic medications such as NSAIDs, Vanco Zosyn combo,  avoid hypotension, continue to follow renal function Rehydrate , obtain urine elctrolytes "

## 2024-10-11 NOTE — Subjective & Objective (Signed)
 Has been sick for the pas 2.5 days has went out to lunch with friends and since Friday has been in bed. Denies sick contacts, Reports URI symptoms body aches, cough, fever, started to cough so hard he vomited, hard time keeping po, started to have generalized fatigue and trouble ambulating. He lives at home alone. EMS called Reports has been feeling so poorly he has not been taking his medications for the past couple days EMS found the patient is hypertensive with blood pressure initially 218/110 heart rate 10/27/2010 patient is febrile up to 102 On arrival to emergency department Persistently febrile 100.7 tachycardic up to 108 Tested positive for influenza A Chest x-ray showing possible infiltrate Patient appears to be dehydrated hemoconcentrated lactic acid elevated 2.3 no evidence of hypoxia still feels significantly fatigued

## 2024-10-11 NOTE — Assessment & Plan Note (Addendum)
 Check CK until then hold statin Resume Coreg  3.125 mg p.o. twice daily

## 2024-10-11 NOTE — Assessment & Plan Note (Addendum)
 CXR with possible PNA will obtian 2V to confirm and pro calcitonin  Given risk factors and pt only recently showing symptoms will start on Tamiflu 

## 2024-10-11 NOTE — Assessment & Plan Note (Signed)
 Currently appears to be on a dry side hold Entresto  rehydrate for fluid status

## 2024-10-12 DIAGNOSIS — E860 Dehydration: Secondary | ICD-10-CM | POA: Diagnosis not present

## 2024-10-12 LAB — COMPREHENSIVE METABOLIC PANEL WITH GFR
ALT: 16 U/L (ref 0–44)
AST: 21 U/L (ref 15–41)
Albumin: 3 g/dL — ABNORMAL LOW (ref 3.5–5.0)
Alkaline Phosphatase: 57 U/L (ref 38–126)
Anion gap: 15 (ref 5–15)
BUN: 32 mg/dL — ABNORMAL HIGH (ref 8–23)
CO2: 19 mmol/L — ABNORMAL LOW (ref 22–32)
Calcium: 8.2 mg/dL — ABNORMAL LOW (ref 8.9–10.3)
Chloride: 100 mmol/L (ref 98–111)
Creatinine, Ser: 1.38 mg/dL — ABNORMAL HIGH (ref 0.61–1.24)
GFR, Estimated: 58 mL/min — ABNORMAL LOW
Glucose, Bld: 140 mg/dL — ABNORMAL HIGH (ref 70–99)
Potassium: 4.2 mmol/L (ref 3.5–5.1)
Sodium: 134 mmol/L — ABNORMAL LOW (ref 135–145)
Total Bilirubin: 0.4 mg/dL (ref 0.0–1.2)
Total Protein: 5.8 g/dL — ABNORMAL LOW (ref 6.5–8.1)

## 2024-10-12 LAB — MAGNESIUM: Magnesium: 2.3 mg/dL (ref 1.7–2.4)

## 2024-10-12 LAB — BASIC METABOLIC PANEL WITH GFR
Anion gap: 15 (ref 5–15)
Anion gap: 13 (ref 5–15)
BUN: 32 mg/dL — ABNORMAL HIGH (ref 8–23)
BUN: 31 mg/dL — ABNORMAL HIGH (ref 8–23)
CO2: 19 mmol/L — ABNORMAL LOW (ref 22–32)
CO2: 20 mmol/L — ABNORMAL LOW (ref 22–32)
Calcium: 8.1 mg/dL — ABNORMAL LOW (ref 8.9–10.3)
Calcium: 8.4 mg/dL — ABNORMAL LOW (ref 8.9–10.3)
Chloride: 101 mmol/L (ref 98–111)
Chloride: 102 mmol/L (ref 98–111)
Creatinine, Ser: 1.34 mg/dL — ABNORMAL HIGH (ref 0.61–1.24)
Creatinine, Ser: 1.31 mg/dL — ABNORMAL HIGH (ref 0.61–1.24)
GFR, Estimated: >60 mL/min
GFR, Estimated: >60 mL/min
Glucose, Bld: 140 mg/dL — ABNORMAL HIGH (ref 70–99)
Glucose, Bld: 124 mg/dL — ABNORMAL HIGH (ref 70–99)
Potassium: 4.2 mmol/L (ref 3.5–5.1)
Potassium: 3.8 mmol/L (ref 3.5–5.1)
Sodium: 135 mmol/L (ref 135–145)
Sodium: 135 mmol/L (ref 135–145)

## 2024-10-12 LAB — OSMOLALITY, URINE: Osmolality, Ur: 702 mosm/kg (ref 300–900)

## 2024-10-12 LAB — URINALYSIS, W/ REFLEX TO CULTURE (INFECTION SUSPECTED)
Bacteria, UA: NONE SEEN
Bilirubin Urine: NEGATIVE
Glucose, UA: >=500 mg/dL — AB
Ketones, ur: 20 mg/dL — AB
Leukocytes,Ua: NEGATIVE
Nitrite: NEGATIVE
Protein, ur: >=300 mg/dL — AB
Specific Gravity, Urine: 1.027 (ref 1.005–1.030)
pH: 5 (ref 5.0–8.0)

## 2024-10-12 LAB — CBC
HCT: 45.5 % (ref 39.0–52.0)
Hemoglobin: 15.4 g/dL (ref 13.0–17.0)
MCH: 29.7 pg (ref 26.0–34.0)
MCHC: 33.8 g/dL (ref 30.0–36.0)
MCV: 87.7 fL (ref 80.0–100.0)
Platelets: 134 K/uL — ABNORMAL LOW (ref 150–400)
RBC: 5.19 MIL/uL (ref 4.22–5.81)
RDW: 13.2 % (ref 11.5–15.5)
WBC: 9.5 K/uL (ref 4.0–10.5)
nRBC: 0 % (ref 0.0–0.2)

## 2024-10-12 LAB — PHOSPHORUS: Phosphorus: 3.8 mg/dL (ref 2.5–4.6)

## 2024-10-12 LAB — GLUCOSE, CAPILLARY
Glucose-Capillary: 113 mg/dL — ABNORMAL HIGH (ref 70–99)
Glucose-Capillary: 129 mg/dL — ABNORMAL HIGH (ref 70–99)
Glucose-Capillary: 130 mg/dL — ABNORMAL HIGH (ref 70–99)
Glucose-Capillary: 143 mg/dL — ABNORMAL HIGH (ref 70–99)
Glucose-Capillary: 153 mg/dL — ABNORMAL HIGH (ref 70–99)
Glucose-Capillary: 160 mg/dL — ABNORMAL HIGH (ref 70–99)
Glucose-Capillary: 99 mg/dL (ref 70–99)

## 2024-10-12 LAB — CREATININE, URINE, RANDOM: Creatinine, Urine: 76 mg/dL

## 2024-10-12 LAB — STREP PNEUMONIAE URINARY ANTIGEN: Strep Pneumo Urinary Antigen: NEGATIVE

## 2024-10-12 LAB — BETA-HYDROXYBUTYRIC ACID: Beta-Hydroxybutyric Acid: 5.46 mmol/L — ABNORMAL HIGH (ref 0.05–0.27)

## 2024-10-12 LAB — LACTIC ACID, PLASMA: Lactic Acid, Venous: 1.2 mmol/L (ref 0.5–1.9)

## 2024-10-12 LAB — SODIUM, URINE, RANDOM: Sodium, Ur: 31 mmol/L

## 2024-10-12 MED ORDER — LACTATED RINGERS IV SOLN: INTRAVENOUS | Status: AC

## 2024-10-12 MED ORDER — DEXTROSE-SODIUM CHLORIDE 5-0.45 % IV SOLN: INTRAVENOUS | Status: DC

## 2024-10-12 MED ORDER — SODIUM CHLORIDE 0.9 % IV BOLUS
500.0000 mL | Freq: Once | INTRAVENOUS | Status: AC
  Administered 2024-10-12: 500 mL via INTRAVENOUS

## 2024-10-12 MED ORDER — ALBUTEROL SULFATE (2.5 MG/3ML) 0.083% IN NEBU: 2.5000 mg | INHALATION_SOLUTION | RESPIRATORY_TRACT | Status: DC | PRN

## 2024-10-12 MED ORDER — PRAVASTATIN SODIUM 40 MG PO TABS
40.0000 mg | ORAL_TABLET | Freq: Every day | ORAL | Status: DC
  Administered 2024-10-13: 40 mg via ORAL
  Filled 2024-10-12: qty 1

## 2024-10-12 MED ORDER — INSULIN ASPART 100 UNIT/ML IJ SOLN
0.0000 [IU] | Freq: Three times a day (TID) | INTRAMUSCULAR | Status: DC
  Administered 2024-10-12: 2 [IU] via SUBCUTANEOUS
  Filled 2024-10-12: qty 2

## 2024-10-12 MED ORDER — HYDRALAZINE HCL 25 MG PO TABS: 25.0000 mg | ORAL_TABLET | Freq: Four times a day (QID) | ORAL | Status: DC | PRN

## 2024-10-12 MED ORDER — DEXTROSE 5 % IV SOLN: INTRAVENOUS | Status: DC

## 2024-10-12 MED ORDER — INSULIN ASPART 100 UNIT/ML IJ SOLN
0.0000 [IU] | Freq: Every day | INTRAMUSCULAR | Status: DC
  Filled 2024-10-12: qty 3

## 2024-10-12 NOTE — TOC Initial Note (Signed)
 Transition of Care [facility]) - Initial/Assessment Note    Patient Details  Name: Brett Walker MRN: [id number] Date of Birth: 1961/05/27  [facility]) CM/SW Contact:    Bascom Service, RN Phone Number: 10/12/2024, 3:20 PM  Clinical Narrative: d/c plan home. Has own transport home.No CM needs.                  Expected Discharge Plan: Home/Self Care Barriers to Discharge: Continued Medical Work up   Patient Goals and CMS Choice Patient states their goals for this hospitalization and ongoing recovery are:: Home CMS Medicare.gov Compare Post Acute Care list provided to:: Patient Choice offered to / list presented to : Patient [place] ownership interest in [facility].provided to:: Patient    Expected Discharge Plan and Services   Discharge Planning Services: CM Consult Post Acute Care Choice: NA Living arrangements for the past 2 months: Single Family Home                                      Prior Living Arrangements/Services Living arrangements for the past 2 months: Single Family Home Lives with:: Self   Do you feel safe going back to the place where you live?: Yes               Activities of Daily Living   ADL Screening (condition at time of admission) Independently performs ADLs?: Yes (appropriate for developmental age) Is the patient deaf or have difficulty hearing?: No Does the patient have difficulty seeing, even when wearing glasses/contacts?: No Does the patient have difficulty concentrating, remembering, or making decisions?: No  Permission Sought/Granted Permission sought to share information with : Case Manager Permission granted to share information with : Yes, Verbal Permission Granted              Emotional Assessment              Admission diagnosis:  Dehydration [E86.0] Influenza A [J10.1] Patient Active Problem List   Diagnosis Date Noted   Dehydration 10/11/2024   Influenza A with pneumonia  10/11/2024   SIRS (systemic inflammatory response syndrome) (HCC) 10/11/2024   Acute kidney injury superimposed on CKD 10/11/2024   Metabolic acidosis, increased anion gap 10/11/2024   Hyperlipidemia 02/07/2022   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 [place] [address] Phone: [phone] Fax: [phone]     Social Drivers of Health (SDOH) Social History: SDOH Screenings   Food Insecurity: No Food Insecurity (10/11/2024)  Housing: Low Risk (10/11/2024)  Transportation Needs: No Transportation Needs (10/11/2024)  Utilities: Not At Risk (10/11/2024)  Depression (PHQ2-9): Low Risk (12/21/2021)  Tobacco Use: Low Risk (10/11/2024)   SDOH Interventions:     Readmission Risk Interventions     No data to display

## 2024-10-12 NOTE — Evaluation (Signed)
 Physical Therapy Evaluation Patient Details Name: Brett Walker MRN: [id number] DOB: 08/07/1961 Today's Date: 10/12/2024  History of Present Illness  Pt is a 63 y/o male presenting with fever, chills, cough and fatigue. Hypertensive on arrival. Flu+, CT chest showed patchy airspace disease in L lung suspicious for PNA. PMH: CAD status post CABG in 2022, systolic CHF secondary to ischemic cardiomyopathy, DM2, hypertension, hyperlipidemia, diabetic neuropathy  Clinical Impression   The patient ambulated independently on RA, SPO2 .96%  . Patient   required no device and no  support. Patient should progress to  return home. Patient independnet and working and driving. PT will sign off.       If plan is discharge home, recommend the following: Assist for transportation   Can travel by private vehicle        Equipment Recommendations None recommended by PT  Recommendations for Other Services       Functional Status Assessment Patient has not had a recent decline in their functional status     Precautions / Restrictions Precautions Precautions: Fall Precaution/Restrictions Comments: low fall risk Restrictions Weight Bearing Restrictions Per Provider Order: No      Mobility  Bed Mobility Overal bed mobility: Independent                  Transfers Overall transfer level: Independent                      Ambulation/Gait Ambulation/Gait assistance: Independent Gait Distance (Feet): 180 Feet Assistive device: None Gait Pattern/deviations: Step-through pattern       General Gait Details: gait steady  Stairs            Wheelchair Mobility     Tilt Bed    Modified Rankin (Stroke Patients Only)       Balance Overall balance assessment: Mild deficits observed, not formally tested                                           Pertinent Vitals/Pain Pain Assessment Pain Assessment: No/denies pain    Home Living  Family/patient expects to be discharged to:: Private residence Living Arrangements: Alone Available Help at Discharge: Family;Available PRN/intermittently Type of Home: Apartment Home Access: Level entry       Home Layout: One level Home Equipment: None      Prior Function Prior Level of Function : Independent/Modified Independent;Working/employed;Driving             Mobility Comments: no AD ADLs Comments: Works pharmacist, hospital. Indep with ADLs, IADLs     Extremity/Trunk Assessment   Upper Extremity Assessment Upper Extremity Assessment: Overall WFL for tasks assessed    Lower Extremity Assessment Lower Extremity Assessment: Generalized weakness    Cervical / Trunk Assessment Cervical / Trunk Assessment: Normal  Communication   Communication Communication: No apparent difficulties    Cognition Arousal: Alert Behavior During Therapy: WFL for tasks assessed/performed   PT - Cognitive impairments: No apparent impairments                         Following commands: Intact       Cueing       General Comments      Exercises     Assessment/Plan    PT Assessment Patient does not need any further PT services  PT Problem List         PT Treatment Interventions      PT Goals (Current goals can be found in the Care Plan section)  Acute Rehab PT Goals Patient Stated Goal: go home PT Goal Formulation: All assessment and education complete, DC therapy    Frequency       Co-evaluation               AM-PAC PT 6 Clicks Mobility  Outcome Measure Help needed turning from your back to your side while in a flat bed without using bedrails?: None Help needed moving from lying on your back to sitting on the side of a flat bed without using bedrails?: None Help needed moving to and from a bed to a chair (including a wheelchair)?: None Help needed standing up from a chair using your arms (e.g., wheelchair or bedside chair)?:  None Help needed to walk in hospital room?: None Help needed climbing 3-5 steps with a railing? : None 6 Click Score: 24    End of Session Equipment Utilized During Treatment: Gait belt Activity Tolerance: Patient tolerated treatment well Patient left: in chair;with call bell/phone within reach;with nursing/sitter in room Nurse Communication: Mobility status PT Visit Diagnosis: Unsteadiness on feet (R26.81)    Time: 8942-8884 PT Time Calculation (min) (ACUTE ONLY): 18 min   Charges:   PT Evaluation $PT Eval Low Complexity: 1 Low   PT General Charges $$ ACUTE PT VISIT: 1 Visit         Darice Potters PT Acute Rehabilitation Services Office [phone]   Potters Darice Norris 10/12/2024, 1:43 PM

## 2024-10-12 NOTE — Plan of Care (Signed)

## 2024-10-12 NOTE — Progress Notes (Signed)
 SATURATION QUALIFICATIONS: (This note is used to comply with regulatory documentation for home oxygen)  Patient Saturations on Room Air at Rest = 95%  Patient Saturations on Room Air while Ambulating = 94%  Patient Saturations on 0 Liters of oxygen while Ambulating = 94%  Please briefly explain why patient needs home oxygen: n/a  Patients O2 does drop to the 70s while sleeping but does not require O2 while awake

## 2024-10-12 NOTE — Progress Notes (Addendum)
 " PROGRESS NOTE    Brett Walker  FMW:[id number] DOB: 07-13-1961 DOA: 10/11/2024 PCP: Seabron Lenis, MD    Brief Narrative:   Brett Walker is a 63 y.o. male with past medical history significant for HTN, chronic systolic congestive heart failure, CAD s/p CABG, HLD, DM2, CKD stage II, diabetic neuropathy who presented to [facility] ED on 10/11/2024 from home via EMS with complaints of generalized weakness/fatigue, nausea/vomiting, headache, abdominal pain and fever (102.0 F at home).  Additionally reports body aches, cough and difficulty keeping oral intake down.  Reports being sick for the past 2.5 days after he went out to lunch with friends.  Denies any sick contacts.  In the ED, temperature 100.7 F, HR 108, RR 24, BP 180/88, SpO2 97% on room air.  WBC 7.9, hemoglobin 17.5, platelet count 152.  Sodium 136, potassium 4.4, chloride 93, CO2 18, glucose 169, BUN 31, creat 1.54.  Lipase 29, AST 24, ALT 22, total bilirubin 0.7.  Lactic acid 2.3.  Influenza A PCR positive.  Flu B/RSV/COVID PCR negative.  Urinalysis with 20 ketones, greater than 300 protein, greater than 500 glucose.  Chest x-ray with patchy airspace disease left base.  Blood cultures x 2 obtained.  Patient was given 500 mL bolus IVF, Zofran .  TRH consulted for admission.  Assessment & Plan:   Severe sepsis, POA Influenza A viral infection Community-acquired pneumonia Patient presenting with fever, generalized weakness/fatigue, body aches, cough and difficulty keeping oral intake down for the last 2-3 days.  Patient reports fever at home 102.0 F.  Febrile on admission with temperature 100.7 F, tachycardic, tachypneic.  Influenza A PCR positive.  Procalcitonin 0.73.  Chest x-ray with patchy airspace disease left base. -- Tamiflu  75 mg p.o. twice daily x 5 days -- Azithromycin  5 mg IV every 24 hours x 5 days -- Ceftriaxone  2 g IV every 24 hours x 5 days -- Mucinex  600 mg p.o. twice daily -- Albuterol  neb  every 2 hours as needed wheezing/shortness of breath -- LR at 75 mL/h -- Incentive spirometry/flutter valve -- Supportive care, antiemetics, pain control  Hypertensive urgency Blood pressure elevated 180/88 at time of admission, patient reports unable to tolerate his oral medications due to nausea and vomiting. -- Resume home carvedilol  3.125 mg p.o. twice daily -- Hydralazine  25 mg p.o. every 6 hours as needed SBP greater than 165  Hyponatremia -- Discontinue D5 half-normal saline, starting LR infusion  -- BMP daily  CKD stage II -- Cr 1.54>1.42>1.34>1.38; stable (Basline 1.2 - 1.3) -- continue LR at 75 mL/h -- BMP in am  Chronic combined systolic and diastolic congestive heart failure, compensated TTE 01/08/2022 with LVEF 40-45%, LV with regional wall motion normalities, LVH, grade 1 diastolic dysfunction, RV systolic function mildly reduced, LA moderately dilated, no aortic stenosis, IVC normal in size. -- Continue carvedilol  3.125 mg PO BID -- Reports no longer on Entresto  -- Holding home Jardiance   CAD s/p CABG HLD -- Pravastatin  40 mg p.o. daily  DM2 Hemoglobin A1c 7.0.  At baseline on Jardiance , metformin  700 mg p.o. twice daily -- Hold oral hypoglycemics while inpatient -- Sensitive SSI for coverage  Diabetic neuropathy -- Gabapentin  900 mg p.o. nightly  Weakness/debility/deconditioning: -- PT evaluation  Obesity, class I Body mass index is 31.01 kg/m.   DVT prophylaxis: SCDs Start: 10/11/24 2043    Code Status: Full Code Family Communication: No family present at bedside  Disposition Plan:  Level of care: Progressive Status is: Observation The  patient remains OBS appropriate and will d/c before 2 midnights.    Consultants:  None  Procedures:  None  Antimicrobials:  Azithromycin  12/21>> Ceftriaxone  12/21>>  Subjective: Patient seen examined bedside, lying in bed.  Dyspnea improved.  Remains febrile, temperature 100.9 F this morning.   Continues with nonproductive cough.  No other specific complaints or concerns at this time.  Denies headache, no vision changes, no chest pain, no palpitations, no chills/night sweats, no current nausea/vomiting, no diarrhea, no abdominal pain, no focal weakness, no paresthesias.  No acute events overnight per nursing.  Objective: Vitals:   10/12/24 0449 10/12/24 0802 10/12/24 1035 10/12/24 1115  BP: (!) 161/92 (!) 142/80    Pulse: 95 100    Resp: (!) 24 18    Temp: 100 F (37.8 C) (!) 100.9 F (38.3 C)    TempSrc: Oral Oral    SpO2: 95% 95% (!) 84% 97%  Weight:      Height:        Intake/Output Summary (Last 24 hours) at 10/12/2024 1116 Last data filed at 10/12/2024 0538 Gross per 24 hour  Intake 2105.19 ml  Output 600 ml  Net 1505.19 ml   Filed Weights   10/11/24 1626  Weight: 95.3 kg    Examination:  Physical Exam: GEN: NAD, alert and oriented x 3, obese HEENT: NCAT, PERRL, EOMI, sclera clear, MMM PULM: CTAB w/o wheezes/crackles, normal respiratory effort, on room air with SpO2 97% at rest CV: RRR w/o M/G/R GI: abd soft, NTND, + BS MSK: no peripheral edema, muscle strength globally intact 5/5 bilateral upper/lower extremities NEURO: No focal neurological deficit PSYCH: normal mood/affect Integumentary: No concerning rashes/lesions/wounds noted on exposed skin surfaces    Data Reviewed: I have personally reviewed following labs and imaging studies  CBC: Recent Labs  Lab 10/11/24 1637 10/12/24 0326  WBC 7.9 9.5  HGB 17.5* 15.4  HCT 51.2 45.5  MCV 88.9 87.7  PLT 152 134*   Basic Metabolic Panel: Recent Labs  Lab 10/11/24 1637 10/11/24 2115 10/12/24 0325 10/12/24 0326 10/12/24 1011  NA 136 135 135 134* 135  K 4.4 4.2 4.2 4.2 3.8  CL 93* 97* 101 100 102  CO2 18* 16* 19* 19* 20*  GLUCOSE 169* 188* 140* 140* 124*  BUN 31* 34* 32* 32* 31*  CREATININE 1.54* 1.42* 1.34* 1.38* 1.31*  CALCIUM 9.2 8.6* 8.1* 8.2* 8.4*  MG  --  2.2  --  2.3  --   PHOS  --   4.2  --  3.8  --    GFR: Estimated Creatinine Clearance: 67.4 mL/min (A) (by C-G formula based on SCr of 1.31 mg/dL (H)). Liver Function Tests: Recent Labs  Lab 10/11/24 1637 10/12/24 0326  AST 24 21  ALT 22 16  ALKPHOS 76 57  BILITOT 0.7 0.4  PROT 7.0 5.8*  ALBUMIN  3.9 3.0*   Recent Labs  Lab 10/11/24 1637  LIPASE 29   No results for input(s): AMMONIA in the last 168 hours. Coagulation Profile: Recent Labs  Lab 10/11/24 1637  INR 1.0   Cardiac Enzymes: Recent Labs  Lab 10/11/24 2115  CKTOTAL 30*   BNP (last 3 results) No results for input(s): PROBNP in the last 8760 hours. HbA1C: Recent Labs    10/11/24 2115  HGBA1C 7.0*   CBG: Recent Labs  Lab 10/12/24 0005 10/12/24 0156 10/12/24 0450 10/12/24 0759 10/12/24 1110  GLUCAP 143* 129* 130* 160* 113*   Lipid Profile: No results for input(s): CHOL, HDL, LDLCALC, TRIG,  CHOLHDL, LDLDIRECT in the last 72 hours. Thyroid Function Tests: No results for input(s): TSH, T4TOTAL, FREET4, T3FREE, THYROIDAB in the last 72 hours. Anemia Panel: No results for input(s): VITAMINB12, FOLATE, FERRITIN, TIBC, IRON, RETICCTPCT in the last 72 hours. Sepsis Labs: Recent Labs  Lab 10/11/24 1646 10/11/24 1843 10/11/24 2115 10/12/24 0010  PROCALCITON  --   --  0.73  --   LATICACIDVEN 2.3* 1.9 1.7 1.2    Recent Results (from the past 240 hours)  Resp panel by RT-PCR (RSV, Flu A&B, Covid)     Status: Abnormal   Collection Time: 10/11/24  4:45 PM   Specimen: Nasal Swab  Result Value Ref Range Status   SARS Coronavirus 2 by RT PCR NEGATIVE NEGATIVE Final    Comment: (NOTE) SARS-CoV-2 target nucleic acids are NOT DETECTED.  The SARS-CoV-2 RNA is generally detectable in upper respiratory specimens during the acute phase of infection. The lowest concentration of SARS-CoV-2 viral copies this assay can detect is 138 copies/mL. A negative result does not preclude SARS-Cov-2 infection  and should not be used as the sole basis for treatment or other patient management decisions. A negative result may occur with  improper specimen collection/handling, submission of specimen other than nasopharyngeal swab, presence of viral mutation(s) within the areas targeted by this assay, and inadequate number of viral copies(<138 copies/mL). A negative result must be combined with clinical observations, patient history, and epidemiological information. The expected result is Negative.  Fact Sheet for Patients:  bloggercourse.com  Fact Sheet for Healthcare Providers:  seriousbroker.it  This test is no t yet approved or cleared by the [place]  FDA and  has been authorized for detection and/or diagnosis of SARS-CoV-2 by FDA under an Emergency Use Authorization (EUA). This EUA will remain  in effect (meaning this test can be used) for the duration of the COVID-19 declaration under Section 564(b)(1) of the Act, 21 U.S.C.section 360bbb-3(b)(1), unless the authorization is terminated  or revoked sooner.       Influenza A by PCR POSITIVE (A) NEGATIVE Final   Influenza B by PCR NEGATIVE NEGATIVE Final    Comment: (NOTE) The Xpert Xpress SARS-CoV-2/FLU/RSV plus assay is intended as an aid in the diagnosis of influenza from Nasopharyngeal swab specimens and should not be used as a sole basis for treatment. Nasal washings and aspirates are unacceptable for Xpert Xpress SARS-CoV-2/FLU/RSV testing.  Fact Sheet for Patients: bloggercourse.com  Fact Sheet for Healthcare Providers: seriousbroker.it  This test is not yet approved or cleared by the [place]  FDA and has been authorized for detection and/or diagnosis of SARS-CoV-2 by FDA under an Emergency Use Authorization (EUA). This EUA will remain in effect (meaning this test can be used) for the duration of the COVID-19  declaration under Section 564(b)(1) of the Act, 21 U.S.C. section 360bbb-3(b)(1), unless the authorization is terminated or revoked.     Resp Syncytial Virus by PCR NEGATIVE NEGATIVE Final    Comment: (NOTE) Fact Sheet for Patients: bloggercourse.com  Fact Sheet for Healthcare Providers: seriousbroker.it  This test is not yet approved or cleared by the [place]  FDA and has been authorized for detection and/or diagnosis of SARS-CoV-2 by FDA under an Emergency Use Authorization (EUA). This EUA will remain in effect (meaning this test can be used) for the duration of the COVID-19 declaration under Section 564(b)(1) of the Act, 21 U.S.C. section 360bbb-3(b)(1), unless the authorization is terminated or revoked.  Performed at [facility], [address], [place], [place] [address]  Radiology Studies: DG Chest 2 View Result Date: 10/11/2024 CLINICAL DATA:  Cough EXAM: CHEST - 2 VIEW COMPARISON:  10/11/2024 , 11/08/2021 FINDINGS: Sternotomy. No acute airspace disease, pleural effusion or pneumothorax. Stable cardiomediastinal silhouette. Previous left lung base opacity presumed to represent atelectasis. IMPRESSION: No active cardiopulmonary disease. Electronically Signed   By: Luke Bun M.D.   On: 10/11/2024 19:13   DG Chest Portable 1 View Result Date: 10/11/2024 CLINICAL DATA:  Cough.  Nausea, vomiting, weakness. EXAM: PORTABLE CHEST 1 VIEW COMPARISON:  11/08/2021 FINDINGS: Post median sternotomy with borderline cardiomegaly. Patchy airspace disease at the left lung base. No pulmonary edema. No pleural effusion or pneumothorax. On limited assessment, no acute osseous findings IMPRESSION: Patchy airspace disease at the left lung base, suspicious for pneumonia. Electronically Signed   By: Andrea Gasman M.D.   On: 10/11/2024 17:33        Scheduled Meds:  carvedilol   3.125 mg Oral BID WC    gabapentin   900 mg Oral QHS   guaiFENesin   600 mg Oral BID   insulin  aspart  0-5 Units Subcutaneous QHS   insulin  aspart  0-9 Units Subcutaneous TID WC   oseltamivir   75 mg Oral BID   [START ON 10/13/2024] pravastatin   40 mg Oral Daily   thiamine  (VITAMIN B1) injection  100 mg Intravenous Daily   Continuous Infusions:  azithromycin  Stopped (10/12/24 0013)   cefTRIAXone  (ROCEPHIN )  IV Stopped (10/11/24 2258)   lactated ringers  75 mL/hr at 10/12/24 0835     LOS: 0 days    Time spent: 52 minutes spent on 10/12/2024 caring for this patient face-to-face including chart review, ordering labs/tests, documenting, discussion with nursing staff, consultants, updating family and interview/physical exam    Camellia PARAS Liela Rylee, DO [facility] Available via Epic secure chat 7am-7pm After these hours, please refer to coverage provider listed on [email or web address] 10/12/2024, 11:16 AM   "

## 2024-10-12 NOTE — Evaluation (Signed)
 Occupational Therapy Evaluation/Discharge Patient Details Name: Brett Walker MRN: [id number] DOB: 1961-09-29 Today's Date: 10/12/2024   History of Present Illness   Pt is a 63 y/o male presenting with fever, chills, cough and fatigue. Hypertensive on arrival. Flu+, CT chest showed patchy airspace disease in L lung suspicious for PNA. PMH: CAD status post CABG in 2022, systolic CHF secondary to ischemic cardiomyopathy, DM2, hypertension, hyperlipidemia, diabetic neuropathy     Clinical Impressions PTA, pt lives alone, typically completely Independent with ADLs, IADLs, driving and mobility. Pt works corporate investment banker. Pt presents now with minor deficits in overall endurance. Pt able to manage ADLs/mobility in room Independently without issues, supervision for hallway mobility for safety. Discussed self monitoring strength for community IADLs and work duties with pt verbalizing understanding. As pt at ADL baseline, no further skilled OT services needed at this time. Recommend pt work with mobility specialists while admitted; functionally appropriate for DC home once medically cleared.      If plan is discharge home, recommend the following:   Other (comment) (PRN)     Functional Status Assessment   Patient has had a recent decline in their functional status and demonstrates the ability to make significant improvements in function in a reasonable and predictable amount of time.     Equipment Recommendations   None recommended by OT     Recommendations for Other Services         Precautions/Restrictions   Precautions Precautions: Fall Precaution/Restrictions Comments: low fall risk Restrictions Weight Bearing Restrictions Per Provider Order: No     Mobility Bed Mobility Overal bed mobility: Modified Independent                  Transfers Overall transfer level: Independent Equipment used: None                      Balance Overall balance  assessment: Mild deficits observed, not formally tested                                         ADL either performed or assessed with clinical judgement   ADL Overall ADL's : Modified independent                                       General ADL Comments: pt able to manage ADLs standing at sink without issues, mobilized in hallway without AD. One minor sway that pt was able to correct without assist. Endorses balance feeling better than on initial admission but still not at baseline. Appears pt overall functional for ADLs/mobility at home. Encouraged self monitoring when feeling comfortable for community tasks and work duties.     Vision Ability to See in Adequate Light: 0 Adequate Patient Visual Report: No change from baseline Vision Assessment?: No apparent visual deficits     Perception         Praxis         Pertinent Vitals/Pain Pain Assessment Pain Assessment: No/denies pain     Extremity/Trunk Assessment Upper Extremity Assessment Upper Extremity Assessment: Overall WFL for tasks assessed;Right hand dominant   Lower Extremity Assessment Lower Extremity Assessment: Defer to PT evaluation   Cervical / Trunk Assessment Cervical / Trunk Assessment: Normal   Communication Communication Communication: No apparent difficulties   Cognition  Arousal: Alert Behavior During Therapy: WFL for tasks assessed/performed Cognition: No apparent impairments             OT - Cognition Comments: pt reports feeling foggy, endorses losing train of though and slower speech, denied memory issues.                 Following commands: Intact       Cueing  General Comments   Cueing Techniques: Verbal cues      Exercises     Shoulder Instructions      Home Living Family/patient expects to be discharged to:: Private residence Living Arrangements: Alone Available Help at Discharge: Family;Available PRN/intermittently Type of  Home: Apartment Home Access: Level entry     Home Layout: One level     Bathroom Shower/Tub: Chief Strategy Officer: Standard                Prior Functioning/Environment Prior Level of Function : Independent/Modified Independent;Working/employed;Driving             Mobility Comments: no AD ADLs Comments: Works pharmacist, hospital. Indep with ADLs, IADLs    OT Problem List:     OT Treatment/Interventions:        OT Goals(Current goals can be found in the care plan section)   Acute Rehab OT Goals Patient Stated Goal: continue to feel better OT Goal Formulation: All assessment and education complete, DC therapy   OT Frequency:       Co-evaluation              AM-PAC OT 6 Clicks Daily Activity     Outcome Measure Help from another person eating meals?: None Help from another person taking care of personal grooming?: None Help from another person toileting, which includes using toliet, bedpan, or urinal?: None Help from another person bathing (including washing, rinsing, drying)?: None Help from another person to put on and taking off regular upper body clothing?: None Help from another person to put on and taking off regular lower body clothing?: None 6 Click Score: 24   End of Session Nurse Communication: Mobility status  Activity Tolerance: Patient tolerated treatment well Patient left: in bed;with call bell/phone within reach  OT Visit Diagnosis: Muscle weakness (generalized) (M62.81)                Time: 9272-9250 OT Time Calculation (min): 22 min Charges:  OT General Charges $OT Visit: 1 Visit OT Evaluation $OT Eval Low Complexity: 1 Low  Mliss NOVAK, OTR/L Acute Rehab Services Office: [phone]   Mliss Fish 10/12/2024, 7:57 AM

## 2024-10-12 NOTE — Plan of Care (Signed)
  Problem: Education: Goal: Ability to describe self-care measures that may prevent or decrease complications (Diabetes Survival Skills Education) will improve Outcome: Progressing Goal: Individualized Educational Video(s) Outcome: Progressing   Problem: Coping: Goal: Ability to adjust to condition or change in health will improve Outcome: Progressing   Problem: Fluid Volume: Goal: Ability to maintain a balanced intake and output will improve Outcome: Progressing   Problem: Health Behavior/Discharge Planning: Goal: Ability to identify and utilize available resources and services will improve Outcome: Progressing Goal: Ability to manage health-related needs will improve Outcome: Progressing   Problem: Metabolic: Goal: Ability to maintain appropriate glucose levels will improve Outcome: Progressing   Problem: Nutritional: Goal: Maintenance of adequate nutrition will improve Outcome: Progressing Goal: Progress toward achieving an optimal weight will improve Outcome: Progressing   Problem: Skin Integrity: Goal: Risk for impaired skin integrity will decrease Outcome: Progressing   Problem: Tissue Perfusion: Goal: Adequacy of tissue perfusion will improve Outcome: Progressing   Problem: Fluid Volume: Goal: Hemodynamic stability will improve Outcome: Progressing   Problem: Clinical Measurements: Goal: Diagnostic test results will improve Outcome: Progressing Goal: Signs and symptoms of infection will decrease Outcome: Progressing   Problem: Respiratory: Goal: Ability to maintain adequate ventilation will improve Outcome: Progressing   Problem: Education: Goal: Knowledge of General Education information will improve Description: Including pain rating scale, medication(s)/side effects and non-pharmacologic comfort measures Outcome: Progressing   Problem: Health Behavior/Discharge Planning: Goal: Ability to manage health-related needs will improve Outcome:  Progressing   Problem: Clinical Measurements: Goal: Ability to maintain clinical measurements within normal limits will improve Outcome: Progressing Goal: Will remain free from infection Outcome: Progressing Goal: Diagnostic test results will improve Outcome: Progressing Goal: Respiratory complications will improve Outcome: Progressing Goal: Cardiovascular complication will be avoided Outcome: Progressing   Problem: Activity: Goal: Risk for activity intolerance will decrease Outcome: Progressing   Problem: Nutrition: Goal: Adequate nutrition will be maintained Outcome: Progressing   Problem: Coping: Goal: Level of anxiety will decrease Outcome: Progressing   Problem: Elimination: Goal: Will not experience complications related to bowel motility Outcome: Progressing Goal: Will not experience complications related to urinary retention Outcome: Progressing   Problem: Pain Managment: Goal: General experience of comfort will improve and/or be controlled Outcome: Progressing   Problem: Safety: Goal: Ability to remain free from injury will improve Outcome: Progressing   Problem: Skin Integrity: Goal: Risk for impaired skin integrity will decrease Outcome: Progressing   Problem: Activity: Goal: Ability to tolerate increased activity will improve Outcome: Progressing   Problem: Clinical Measurements: Goal: Ability to maintain a body temperature in the normal range will improve Outcome: Progressing   Problem: Respiratory: Goal: Ability to maintain adequate ventilation will improve Outcome: Progressing Goal: Ability to maintain a clear airway will improve Outcome: Progressing

## 2024-10-13 DIAGNOSIS — E860 Dehydration: Secondary | ICD-10-CM | POA: Diagnosis not present

## 2024-10-13 LAB — BASIC METABOLIC PANEL WITH GFR
Anion gap: 14 (ref 5–15)
BUN: 30 mg/dL — ABNORMAL HIGH (ref 8–23)
CO2: 21 mmol/L — ABNORMAL LOW (ref 22–32)
Calcium: 8.6 mg/dL — ABNORMAL LOW (ref 8.9–10.3)
Chloride: 103 mmol/L (ref 98–111)
Creatinine, Ser: 1.2 mg/dL (ref 0.61–1.24)
GFR, Estimated: >60 mL/min
Glucose, Bld: 76 mg/dL (ref 70–99)
Potassium: 4.1 mmol/L (ref 3.5–5.1)
Sodium: 137 mmol/L (ref 135–145)

## 2024-10-13 LAB — GLUCOSE, CAPILLARY: Glucose-Capillary: 84 mg/dL (ref 70–99)

## 2024-10-13 MED ORDER — THIAMINE MONONITRATE 100 MG PO TABS
100.0000 mg | ORAL_TABLET | Freq: Every day | ORAL | Status: DC
  Administered 2024-10-13: 100 mg via ORAL
  Filled 2024-10-13: qty 1

## 2024-10-13 MED ORDER — LEVOFLOXACIN 750 MG PO TABS: 750.0000 mg | ORAL_TABLET | Freq: Every day | ORAL | 0 refills | Status: AC

## 2024-10-13 MED ORDER — OSELTAMIVIR PHOSPHATE 75 MG PO CAPS: 75.0000 mg | ORAL_CAPSULE | Freq: Two times a day (BID) | ORAL | 0 refills | Status: AC

## 2024-10-13 MED ORDER — GUAIFENESIN ER 600 MG PO TB12: 600.0000 mg | ORAL_TABLET | Freq: Two times a day (BID) | ORAL | 0 refills | Status: AC

## 2024-10-13 NOTE — Progress Notes (Signed)
 Mobility Specialist - Progress Note   10/13/24 1115  Mobility  Level of Assistance Independent  Assistive Device None  Distance Ambulated (ft) 180 ft  Activity Response Tolerated well  Mobility Referral Yes  Mobility visit 1 Mobility  Mobility Specialist Start Time (ACUTE ONLY) 1022  Mobility Specialist Stop Time (ACUTE ONLY) 1027  Mobility Specialist Time Calculation (min) (ACUTE ONLY) 5 min   Pt agreeable to mobilize this morning. No complaints made during session. Pt returned to recliner with call light at side.   Alfornia EDISON Mobility Specialist Acute Rehabilitation Services 10/13/2024, 11:16 AM

## 2024-10-13 NOTE — Discharge Summary (Signed)
 " Physician Discharge Summary  Brett Walker FMW:[id number] DOB: 04/04/1961 DOA: 10/11/2024  PCP: Seabron Lenis, MD  Admit date: 10/11/2024 Discharge date: 10/13/2024  Admitted From: Home Disposition: Home  Recommendations for Outpatient Follow-up:  Follow up with PCP in 1-2 weeks Continue Tamiflu  to complete 5-day course for influenza A viral infection Continue Levaquin  to complete 7-day course for commune acquired pneumonia  Home Health: No needs identified by PT/OT while inpatient Equipment/Devices: None  Discharge Condition: Stable CODE STATUS: Full code Diet recommendation: Heart healthy diet  History of present illness:  Brett Walker is a 63 y.o. male with past medical history significant for HTN, chronic systolic congestive heart failure, CAD s/p CABG, HLD, DM2, CKD stage II, diabetic neuropathy who presented to [facility] ED on 10/11/2024 from home via EMS with complaints of generalized weakness/fatigue, nausea/vomiting, headache, abdominal pain and fever (102.0 F at home).  Additionally reports body aches, cough and difficulty keeping oral intake down.  Reports being sick for the past 2.5 days after he went out to lunch with friends.  Denies any sick contacts.   In the ED, temperature 100.7 F, HR 108, RR 24, BP 180/88, SpO2 97% on room air.  WBC 7.9, hemoglobin 17.5, platelet count 152.  Sodium 136, potassium 4.4, chloride 93, CO2 18, glucose 169, BUN 31, creat 1.54.  Lipase 29, AST 24, ALT 22, total bilirubin 0.7.  Lactic acid 2.3.  Influenza A PCR positive.  Flu B/RSV/COVID PCR negative.  Urinalysis with 20 ketones, greater than 300 protein, greater than 500 glucose.  Chest x-ray with patchy airspace disease left base.  Blood cultures x 2 obtained.  Patient was given 500 mL bolus IVF, Zofran .  TRH consulted for admission.  Hospital course:  Severe sepsis, POA Influenza A viral infection Community-acquired pneumonia Patient presenting with fever,  generalized weakness/fatigue, body aches, cough and difficulty keeping oral intake down for the last 2-3 days.  Patient reports fever at home 102.0 F.  Febrile on admission with temperature 100.7 F, tachycardic, tachypneic.  Influenza A PCR positive.  Procalcitonin 0.73.  Chest x-ray with patchy airspace disease left base.  Patient was started on Tamiflu , azithromycin  and ceftriaxone  with improvement of symptoms.  Patient did not require any supplemental oxygen and fever has resolved.  Will discharge on Tamiflu  to complete 5-day course and Levaquin  to complete 7-day course.  Outpatient follow-up with PCP.   Hypertensive urgency Blood pressure elevated 180/88 at time of admission, patient reports unable to tolerate his oral medications due to nausea and vomiting. Resumed home carvedilol  3.125 mg p.o. twice daily.  BP 137/81 at time of discharge.   Hyponatremia Etiology likely secondary to poor oral intake.  Supported with IV fluid hydration with improvement of sodium to 137 at time of discharge.  CKD stage II Etiology likely secondary to prerenal acidemia in the setting of dehydration from sepsis as above.  Supported with IV fluid hydration with improvement of creatinine to 1.20 at time of discharge.   Chronic combined systolic and diastolic congestive heart failure, compensated TTE 01/08/2022 with LVEF 40-45%, LV with regional wall motion normalities, LVH, grade 1 diastolic dysfunction, RV systolic function mildly reduced, LA moderately dilated, no aortic stenosis, IVC normal in size. Continue carvedilol  3.125 mg PO BID and Jardiance , reports no longer on Entresto .  Outpatient follow-up with cardiology   CAD s/p CABG HLD Pravastatin  40 mg p.o. daily   DM2 Hemoglobin A1c 7.0.  At baseline on Jardiance , metformin  750 mg p.o. twice  daily   Diabetic neuropathy Gabapentin  900 mg p.o. nightly   Weakness/debility/deconditioning: Seen by PT/OT with no needs identified.   Obesity, class I Body  mass index is 31.01 kg/m.  Discharge Diagnoses:  Principal Problem:   Dehydration Active Problems:   Essential hypertension   DM (diabetes mellitus), type 2 (HCC)   Ischemic cardiomyopathy   3-vessel coronary artery disease   Hyperlipidemia   Influenza A with pneumonia   SIRS (systemic inflammatory response syndrome) (HCC)   Acute kidney injury superimposed on CKD   Metabolic acidosis, increased anion gap    Discharge Instructions  Discharge Instructions     Call MD for:  difficulty breathing, headache or visual disturbances   Complete by: As directed    Call MD for:  extreme fatigue   Complete by: As directed    Call MD for:  persistant dizziness or light-headedness   Complete by: As directed    Call MD for:  persistant nausea and vomiting   Complete by: As directed    Call MD for:  severe uncontrolled pain   Complete by: As directed    Call MD for:  temperature >100.4   Complete by: As directed    Increase activity slowly   Complete by: As directed       Allergies as of 10/13/2024       Reactions   Penicillin G Hives   Atorvastatin Diarrhea, Other (See Comments)   Fecal urgency   Ozempic (0.25 Or 0.5 Mg-dose) [semaglutide(0.25 Or 0.5mg -dos)] Diarrhea   Rosuvastatin Diarrhea, Other (See Comments)   GI cramping, too        Medication List     STOP taking these medications    Entresto  24-26 MG Generic drug: sacubitril -valsartan    Repatha  SureClick 140 MG/ML Soaj Generic drug: Evolocumab        TAKE these medications    aspirin  EC 81 MG tablet Take 1 tablet (81 mg total) by mouth daily. Swallow whole.   carvedilol  3.125 MG tablet Commonly known as: COREG  TAKE 1 TABLET BY MOUTH TWICE A DAY WITH A MEAL   cyanocobalamin  1000 MCG tablet Commonly known as: VITAMIN B12 Take 3,000 mcg by mouth daily.   empagliflozin  10 MG Tabs tablet Commonly known as: Jardiance  Take 1 tablet (10 mg total) by mouth daily before breakfast.   gabapentin  300 MG  capsule Commonly known as: NEURONTIN  Take 3 capsules (900 mg total) by mouth at bedtime.   guaiFENesin  600 MG 12 hr tablet Commonly known as: MUCINEX  Take 1 tablet (600 mg total) by mouth 2 (two) times daily for 14 days.   Kerendia 10 MG Tabs Generic drug: Finerenone Take 10 mg by mouth daily.   levofloxacin  750 MG tablet Commonly known as: Levaquin  Take 1 tablet (750 mg total) by mouth daily for 5 days.   metFORMIN  750 MG 24 hr tablet Commonly known as: GLUCOPHAGE -XR Take 1 tablet (750 mg total) by mouth 2 (two) times daily.   NON FORMULARY Take 0.5 Pieces by mouth See admin instructions. CBD sleep gummie - Chew 0.5 gummie by mouth at bedtime   oseltamivir  75 MG capsule Commonly known as: Tamiflu  Take 1 capsule (75 mg total) by mouth 2 (two) times daily for 6 doses.   pravastatin  40 MG tablet Commonly known as: PRAVACHOL  TAKE 1 TABLET BY MOUTH EVERY DAY        Follow-up Information     Seabron Lenis, MD. Schedule an appointment as soon as possible for a visit in 1 week(s).  Specialty: Family Medicine Contact information: [phone] [address] [place] [address] [phone]                Allergies[1]  Consultations: None   Procedures/Studies: DG Chest 2 View Result Date: 10/11/2024 CLINICAL DATA:  Cough EXAM: CHEST - 2 VIEW COMPARISON:  10/11/2024 , 11/08/2021 FINDINGS: Sternotomy. No acute airspace disease, pleural effusion or pneumothorax. Stable cardiomediastinal silhouette. Previous left lung base opacity presumed to represent atelectasis. IMPRESSION: No active cardiopulmonary disease. Electronically Signed   By: Luke Bun M.D.   On: 10/11/2024 19:13   DG Chest Portable 1 View Result Date: 10/11/2024 CLINICAL DATA:  Cough.  Nausea, vomiting, weakness. EXAM: PORTABLE CHEST 1 VIEW COMPARISON:  11/08/2021 FINDINGS: Post median sternotomy with borderline cardiomegaly. Patchy airspace disease at the left lung base. No pulmonary edema.  No pleural effusion or pneumothorax. On limited assessment, no acute osseous findings IMPRESSION: Patchy airspace disease at the left lung base, suspicious for pneumonia. Electronically Signed   By: Andrea Gasman M.D.   On: 10/11/2024 17:33     Subjective: Patient seen examined bedside, lying in bed.  Continues with nonproductive cough that is mild.  Remains afebrile.  Not requiring any oxygen.  Ready for discharge home.  Will continue Tamiflu  and antibiotics with Levaquin  to complete course for influenza A viral infection and commune acquired pneumonia.  No other questions or concerns at this time.  Denies headache, no dizziness, no chest pain, no palpitations, no shortness of breath, no abdominal pain, no fever/chills/night sweats, no nausea cefonicid diarrhea, no focal weakness, no fatigue, no paresthesias.  No acute events overnight per nursing.  Discharge Exam: Vitals:   10/13/24 0425 10/13/24 0904  BP: (!) 106/51 137/81  Pulse: 83 82  Resp: 16   Temp: 98.4 F (36.9 C)   SpO2: 98% 96%   Vitals:   10/12/24 2008 10/12/24 2100 10/13/24 0425 10/13/24 0904  BP: 106/70  (!) 106/51 137/81  Pulse: 75  83 82  Resp: 18 15 16    Temp: 98.3 F (36.8 C)  98.4 F (36.9 C)   TempSrc: Oral     SpO2: 96%  98% 96%  Weight:      Height:        Physical Exam: GEN: NAD, alert and oriented x 3, obese HEENT: NCAT, PERRL, EOMI, sclera clear, MMM PULM: CTAB w/o wheezes/crackles, normal respiratory effort, on room air with SpO2 96% at rest CV: RRR w/o M/G/R GI: abd soft, NTND, + BS MSK: no peripheral edema, moves all extremities independently with preserved muscle strength NEURO: No focal neurological deficit PSYCH: normal mood/affect Integumentary: dry/intact, no rashes or wounds    The results of significant diagnostics from this hospitalization (including imaging, microbiology, ancillary and laboratory) are listed below for reference.     Microbiology: Recent Results (from the past  240 hours)  Culture, blood (Routine x 2)     Status: None (Preliminary result)   Collection Time: 10/11/24  4:37 PM   Specimen: BLOOD  Result Value Ref Range Status   Specimen Description   Final    BLOOD RIGHT ANTECUBITAL Performed at [facility], [address], [place], [place] [address]    Special Requests   Final    BOTTLES DRAWN AEROBIC AND ANAEROBIC Blood Culture adequate volume Performed at [facility], [address], [place], [place] [address]    Culture   Final    NO GROWTH 2 DAYS Performed at [facility], [address], [place], [place] [address]    Report Status PENDING  Incomplete  Resp panel by RT-PCR (RSV, Flu A&B, Covid)     Status: Abnormal   Collection Time: 10/11/24  4:45 PM   Specimen: Nasal Swab  Result Value Ref Range Status   SARS Coronavirus 2 by RT PCR NEGATIVE NEGATIVE Final    Comment: (NOTE) SARS-CoV-2 target nucleic acids are NOT DETECTED.  The SARS-CoV-2 RNA is generally detectable in upper respiratory specimens during the acute phase of infection. The lowest concentration of SARS-CoV-2 viral copies this assay can detect is 138 copies/mL. A negative result does not preclude SARS-Cov-2 infection and should not be used as the sole basis for treatment or other patient management decisions. A negative result may occur with  improper specimen collection/handling, submission of specimen other than nasopharyngeal swab, presence of viral mutation(s) within the areas targeted by this assay, and inadequate number of viral copies(<138 copies/mL). A negative result must be combined with clinical observations, patient history, and epidemiological information. The expected result is Negative.  Fact Sheet for Patients:  bloggercourse.com  Fact Sheet for Healthcare Providers:  seriousbroker.it  This test is no t yet approved or cleared by the [place]  FDA  and  has been authorized for detection and/or diagnosis of SARS-CoV-2 by FDA under an Emergency Use Authorization (EUA). This EUA will remain  in effect (meaning this test can be used) for the duration of the COVID-19 declaration under Section 564(b)(1) of the Act, 21 U.S.C.section 360bbb-3(b)(1), unless the authorization is terminated  or revoked sooner.       Influenza A by PCR POSITIVE (A) NEGATIVE Final   Influenza B by PCR NEGATIVE NEGATIVE Final    Comment: (NOTE) The Xpert Xpress SARS-CoV-2/FLU/RSV plus assay is intended as an aid in the diagnosis of influenza from Nasopharyngeal swab specimens and should not be used as a sole basis for treatment. Nasal washings and aspirates are unacceptable for Xpert Xpress SARS-CoV-2/FLU/RSV testing.  Fact Sheet for Patients: bloggercourse.com  Fact Sheet for Healthcare Providers: seriousbroker.it  This test is not yet approved or cleared by the [place]  FDA and has been authorized for detection and/or diagnosis of SARS-CoV-2 by FDA under an Emergency Use Authorization (EUA). This EUA will remain in effect (meaning this test can be used) for the duration of the COVID-19 declaration under Section 564(b)(1) of the Act, 21 U.S.C. section 360bbb-3(b)(1), unless the authorization is terminated or revoked.     Resp Syncytial Virus by PCR NEGATIVE NEGATIVE Final    Comment: (NOTE) Fact Sheet for Patients: bloggercourse.com  Fact Sheet for Healthcare Providers: seriousbroker.it  This test is not yet approved or cleared by the [place]  FDA and has been authorized for detection and/or diagnosis of SARS-CoV-2 by FDA under an Emergency Use Authorization (EUA). This EUA will remain in effect (meaning this test can be used) for the duration of the COVID-19 declaration under Section 564(b)(1) of the Act, 21 U.S.C. section  360bbb-3(b)(1), unless the authorization is terminated or revoked.  Performed at [facility], [address] [place], [place] [address]   Culture, blood (Routine x 2)     Status: None (Preliminary result)   Collection Time: 10/11/24  6:35 PM   Specimen: BLOOD  Result Value Ref Range Status   Specimen Description   Final    BLOOD BLOOD RIGHT FOREARM Performed at [facility], [address], [place], [place] [address]    Special Requests  Final    BOTTLES DRAWN AEROBIC AND ANAEROBIC Blood Culture adequate volume Performed at [facility], [address], [place], [place] [address]    Culture   Final    NO GROWTH 2 DAYS Performed at [facility], [address], [place], [place] [address]    Report Status PENDING  Incomplete     Labs: BNP (last 3 results) No results for input(s): BNP in the last 8760 hours. Basic Metabolic Panel: Recent Labs  Lab 10/11/24 2115 10/12/24 0325 10/12/24 0326 10/12/24 1011 10/13/24 0514  NA 135 135 134* 135 137  K 4.2 4.2 4.2 3.8 4.1  CL 97* 101 100 102 103  CO2 16* 19* 19* 20* 21*  GLUCOSE 188* 140* 140* 124* 76  BUN 34* 32* 32* 31* 30*  CREATININE 1.42* 1.34* 1.38* 1.31* 1.20  CALCIUM 8.6* 8.1* 8.2* 8.4* 8.6*  MG 2.2  --  2.3  --   --   PHOS 4.2  --  3.8  --   --    Liver Function Tests: Recent Labs  Lab 10/11/24 1637 10/12/24 0326  AST 24 21  ALT 22 16  ALKPHOS 76 57  BILITOT 0.7 0.4  PROT 7.0 5.8*  ALBUMIN  3.9 3.0*   Recent Labs  Lab 10/11/24 1637  LIPASE 29   No results for input(s): AMMONIA in the last 168 hours. CBC: Recent Labs  Lab 10/11/24 1637 10/12/24 0326  WBC 7.9 9.5  HGB 17.5* 15.4  HCT 51.2 45.5  MCV 88.9 87.7  PLT 152 134*   Cardiac Enzymes: Recent Labs  Lab 10/11/24 2115  CKTOTAL 30*   BNP: Invalid input(s): POCBNP CBG: Recent Labs  Lab 10/12/24 0759 10/12/24 1110 10/12/24 1706 10/12/24 2015 10/13/24 0722   GLUCAP 160* 113* 153* 99 84   D-Dimer No results for input(s): DDIMER in the last 72 hours. Hgb A1c Recent Labs    10/11/24 2115  HGBA1C 7.0*   Lipid Profile No results for input(s): CHOL, HDL, LDLCALC, TRIG, CHOLHDL, LDLDIRECT in the last 72 hours. Thyroid function studies No results for input(s): TSH, T4TOTAL, T3FREE, THYROIDAB in the last 72 hours.  Invalid input(s): FREET3 Anemia work up No results for input(s): VITAMINB12, FOLATE, FERRITIN, TIBC, IRON, RETICCTPCT in the last 72 hours. Urinalysis    Component Value Date/Time   COLORURINE YELLOW 10/11/2024 2153   APPEARANCEUR CLEAR 10/11/2024 2153   LABSPEC 1.027 10/11/2024 2153   PHURINE 5.0 10/11/2024 2153   GLUCOSEU >=500 (A) 10/11/2024 2153   HGBUR MODERATE (A) 10/11/2024 2153   BILIRUBINUR NEGATIVE 10/11/2024 2153   KETONESUR 20 (A) 10/11/2024 2153   PROTEINUR >=300 (A) 10/11/2024 2153   NITRITE NEGATIVE 10/11/2024 2153   LEUKOCYTESUR NEGATIVE 10/11/2024 2153   Sepsis Labs Recent Labs  Lab 10/11/24 1637 10/12/24 0326  WBC 7.9 9.5   Microbiology Recent Results (from the past 240 hours)  Culture, blood (Routine x 2)     Status: None (Preliminary result)   Collection Time: 10/11/24  4:37 PM   Specimen: BLOOD  Result Value Ref Range Status   Specimen Description   Final    BLOOD RIGHT ANTECUBITAL Performed at [facility], [address], [place], [place] [address]    Special Requests   Final    BOTTLES DRAWN AEROBIC AND ANAEROBIC Blood Culture adequate volume Performed at [facility], [address], [place], [place] [address]    Culture   Final    NO GROWTH 2 DAYS  Performed at [facility], [address] [place], [place] [address]    Report Status PENDING  Incomplete  Resp panel by RT-PCR (RSV, Flu A&B, Covid)     Status: Abnormal   Collection Time: 10/11/24  4:45 PM   Specimen: Nasal Swab  Result Value Ref Range  Status   SARS Coronavirus 2 by RT PCR NEGATIVE NEGATIVE Final    Comment: (NOTE) SARS-CoV-2 target nucleic acids are NOT DETECTED.  The SARS-CoV-2 RNA is generally detectable in upper respiratory specimens during the acute phase of infection. The lowest concentration of SARS-CoV-2 viral copies this assay can detect is 138 copies/mL. A negative result does not preclude SARS-Cov-2 infection and should not be used as the sole basis for treatment or other patient management decisions. A negative result may occur with  improper specimen collection/handling, submission of specimen other than nasopharyngeal swab, presence of viral mutation(s) within the areas targeted by this assay, and inadequate number of viral copies(<138 copies/mL). A negative result must be combined with clinical observations, patient history, and epidemiological information. The expected result is Negative.  Fact Sheet for Patients:  bloggercourse.com  Fact Sheet for Healthcare Providers:  seriousbroker.it  This test is no t yet approved or cleared by the [place]  FDA and  has been authorized for detection and/or diagnosis of SARS-CoV-2 by FDA under an Emergency Use Authorization (EUA). This EUA will remain  in effect (meaning this test can be used) for the duration of the COVID-19 declaration under Section 564(b)(1) of the Act, 21 U.S.C.section 360bbb-3(b)(1), unless the authorization is terminated  or revoked sooner.       Influenza A by PCR POSITIVE (A) NEGATIVE Final   Influenza B by PCR NEGATIVE NEGATIVE Final    Comment: (NOTE) The Xpert Xpress SARS-CoV-2/FLU/RSV plus assay is intended as an aid in the diagnosis of influenza from Nasopharyngeal swab specimens and should not be used as a sole basis for treatment. Nasal washings and aspirates are unacceptable for Xpert Xpress SARS-CoV-2/FLU/RSV testing.  Fact Sheet for  Patients: bloggercourse.com  Fact Sheet for Healthcare Providers: seriousbroker.it  This test is not yet approved or cleared by the [place]  FDA and has been authorized for detection and/or diagnosis of SARS-CoV-2 by FDA under an Emergency Use Authorization (EUA). This EUA will remain in effect (meaning this test can be used) for the duration of the COVID-19 declaration under Section 564(b)(1) of the Act, 21 U.S.C. section 360bbb-3(b)(1), unless the authorization is terminated or revoked.     Resp Syncytial Virus by PCR NEGATIVE NEGATIVE Final    Comment: (NOTE) Fact Sheet for Patients: bloggercourse.com  Fact Sheet for Healthcare Providers: seriousbroker.it  This test is not yet approved or cleared by the [place]  FDA and has been authorized for detection and/or diagnosis of SARS-CoV-2 by FDA under an Emergency Use Authorization (EUA). This EUA will remain in effect (meaning this test can be used) for the duration of the COVID-19 declaration under Section 564(b)(1) of the Act, 21 U.S.C. section 360bbb-3(b)(1), unless the authorization is terminated or revoked.  Performed at [facility], [address], [place], [place] [address]   Culture, blood (Routine x 2)     Status: None (Preliminary result)   Collection Time: 10/11/24  6:35 PM   Specimen: BLOOD  Result Value Ref Range Status   Specimen Description   Final    BLOOD BLOOD RIGHT FOREARM Performed at [facility], [address] [place], [place] [address]  Special Requests   Final    BOTTLES DRAWN AEROBIC AND ANAEROBIC Blood Culture adequate volume Performed at [facility], [address], [place], [place] [address]    Culture   Final    NO GROWTH 2 DAYS Performed at [facility], [address], [place], [place] [address]    Report Status  PENDING  Incomplete     Time coordinating discharge: Over 30 minutes  SIGNED:   Camellia PARAS Tasman Zapata, DO  [facility] 10/13/2024, 9:51 AM     [1]  Allergies Allergen Reactions   Penicillin G Hives   Atorvastatin Diarrhea and Other (See Comments)    Fecal urgency   Ozempic (0.25 Or 0.5 Mg-Dose) [Semaglutide(0.25 Or 0.5mg -Dos)] Diarrhea   Rosuvastatin Diarrhea and Other (See Comments)    GI cramping, too   "

## 2024-10-14 LAB — LEGIONELLA PNEUMOPHILA SEROGP 1 UR AG: L. pneumophila Serogp 1 Ur Ag: NEGATIVE

## 2024-10-15 LAB — VITAMIN B1: Vitamin B1 (Thiamine): 166.9 nmol/L (ref 66.5–200.0)

## 2024-10-16 LAB — CULTURE, BLOOD (ROUTINE X 2)
Culture: NO GROWTH
Culture: NO GROWTH
Special Requests: ADEQUATE
Special Requests: ADEQUATE
